# Patient Record
Sex: Female | Born: 1950
Health system: Southern US, Community
[De-identification: ages and names within clinical notes are randomized; demographics above are authoritative.]

## PROBLEM LIST (undated history)

## (undated) DIAGNOSIS — F32A Depression, unspecified: Secondary | ICD-10-CM

## (undated) DIAGNOSIS — M1990 Unspecified osteoarthritis, unspecified site: Secondary | ICD-10-CM

## (undated) DIAGNOSIS — R3129 Other microscopic hematuria: Secondary | ICD-10-CM

## (undated) DIAGNOSIS — K449 Diaphragmatic hernia without obstruction or gangrene: Secondary | ICD-10-CM

## (undated) DIAGNOSIS — K219 Gastro-esophageal reflux disease without esophagitis: Secondary | ICD-10-CM

## (undated) DIAGNOSIS — E039 Hypothyroidism, unspecified: Secondary | ICD-10-CM

## (undated) DIAGNOSIS — M545 Low back pain, unspecified: Secondary | ICD-10-CM

## (undated) DIAGNOSIS — Z9889 Other specified postprocedural states: Secondary | ICD-10-CM

## (undated) DIAGNOSIS — E785 Hyperlipidemia, unspecified: Secondary | ICD-10-CM

## (undated) DIAGNOSIS — G8929 Other chronic pain: Secondary | ICD-10-CM

## (undated) DIAGNOSIS — M5126 Other intervertebral disc displacement, lumbar region: Secondary | ICD-10-CM

## (undated) DIAGNOSIS — E669 Obesity, unspecified: Secondary | ICD-10-CM

## (undated) DIAGNOSIS — F419 Anxiety disorder, unspecified: Secondary | ICD-10-CM

## (undated) DIAGNOSIS — I471 Supraventricular tachycardia: Secondary | ICD-10-CM

## (undated) DIAGNOSIS — R12 Heartburn: Secondary | ICD-10-CM

## (undated) DIAGNOSIS — M25519 Pain in unspecified shoulder: Secondary | ICD-10-CM

## (undated) DIAGNOSIS — D509 Iron deficiency anemia, unspecified: Secondary | ICD-10-CM

## (undated) DIAGNOSIS — F329 Major depressive disorder, single episode, unspecified: Secondary | ICD-10-CM

## (undated) DIAGNOSIS — I10 Essential (primary) hypertension: Secondary | ICD-10-CM

## (undated) DIAGNOSIS — N3090 Cystitis, unspecified without hematuria: Secondary | ICD-10-CM

## (undated) DIAGNOSIS — F1911 Other psychoactive substance abuse, in remission: Secondary | ICD-10-CM

## (undated) DIAGNOSIS — R030 Elevated blood-pressure reading, without diagnosis of hypertension: Secondary | ICD-10-CM

## (undated) DIAGNOSIS — Z86718 Personal history of other venous thrombosis and embolism: Secondary | ICD-10-CM

## (undated) HISTORY — DX: Unspecified osteoarthritis, unspecified site: M19.90

## (undated) HISTORY — DX: Other chronic pain: G89.29

## (undated) HISTORY — DX: Major depressive disorder, single episode, unspecified: F32.9

## (undated) HISTORY — DX: Anxiety disorder, unspecified: F41.9

## (undated) HISTORY — DX: Depression, unspecified: F32.A

## (undated) HISTORY — DX: Obesity, unspecified: E66.9

## (undated) HISTORY — PX: BLADDER REPAIR: SHX76

## (undated) HISTORY — DX: Iron deficiency anemia, unspecified: D50.9

## (undated) HISTORY — DX: Hyperlipidemia, unspecified: E78.5

## (undated) HISTORY — PX: SHOULDER SURGERY: SHX246

## (undated) HISTORY — DX: Personal history of other venous thrombosis and embolism: Z86.718

## (undated) HISTORY — DX: Other specified postprocedural states: Z98.890

## (undated) HISTORY — DX: Cystitis, unspecified without hematuria: N30.90

## (undated) HISTORY — DX: Other microscopic hematuria: R31.29

## (undated) HISTORY — DX: Low back pain, unspecified: M54.50

## (undated) HISTORY — DX: Hypothyroidism, unspecified: E03.9

## (undated) HISTORY — DX: Diaphragmatic hernia without obstruction or gangrene: K44.9

## (undated) HISTORY — PX: APPENDECTOMY: SHX54

## (undated) HISTORY — DX: Low back pain: M54.5

## (undated) HISTORY — PX: CARPAL TUNNEL RELEASE: SHX101

## (undated) HISTORY — DX: Heartburn: R12

## (undated) HISTORY — DX: Supraventricular tachycardia: I47.1

## (undated) HISTORY — DX: Other psychoactive substance abuse, in remission: F19.11

## (undated) HISTORY — PX: RECTAL PROLAPSE REPAIR: SHX759

## (undated) HISTORY — PX: BACK SURGERY: SHX140

## (undated) HISTORY — DX: Other intervertebral disc displacement, lumbar region: M51.26

## (undated) HISTORY — DX: Elevated blood-pressure reading, without diagnosis of hypertension: R03.0

---

## 1998-06-23 ENCOUNTER — Other Ambulatory Visit: Admission: RE | Admit: 1998-06-23 | Discharge: 1998-06-23 | Payer: Self-pay | Admitting: Obstetrics and Gynecology

## 1999-03-25 ENCOUNTER — Ambulatory Visit (HOSPITAL_COMMUNITY): Admission: RE | Admit: 1999-03-25 | Discharge: 1999-03-25 | Payer: Self-pay

## 1999-04-12 ENCOUNTER — Encounter: Payer: Self-pay | Admitting: Obstetrics and Gynecology

## 1999-04-12 ENCOUNTER — Encounter: Admission: RE | Admit: 1999-04-12 | Discharge: 1999-04-12 | Payer: Self-pay | Admitting: Obstetrics and Gynecology

## 1999-04-14 ENCOUNTER — Encounter: Payer: Self-pay | Admitting: Neurosurgery

## 1999-04-14 ENCOUNTER — Ambulatory Visit (HOSPITAL_COMMUNITY): Admission: RE | Admit: 1999-04-14 | Discharge: 1999-04-14 | Payer: Self-pay | Admitting: Neurosurgery

## 1999-04-29 ENCOUNTER — Encounter: Payer: Self-pay | Admitting: Neurosurgery

## 1999-04-29 ENCOUNTER — Ambulatory Visit (HOSPITAL_COMMUNITY): Admission: RE | Admit: 1999-04-29 | Discharge: 1999-04-29 | Payer: Self-pay | Admitting: Neurosurgery

## 1999-06-08 ENCOUNTER — Encounter: Payer: Self-pay | Admitting: Neurosurgery

## 1999-06-08 ENCOUNTER — Ambulatory Visit (HOSPITAL_COMMUNITY): Admission: RE | Admit: 1999-06-08 | Discharge: 1999-06-08 | Payer: Self-pay | Admitting: Neurosurgery

## 1999-06-10 ENCOUNTER — Encounter: Payer: Self-pay | Admitting: Neurosurgery

## 1999-06-14 ENCOUNTER — Encounter: Payer: Self-pay | Admitting: Neurosurgery

## 1999-06-14 ENCOUNTER — Inpatient Hospital Stay (HOSPITAL_COMMUNITY): Admission: RE | Admit: 1999-06-14 | Discharge: 1999-06-18 | Payer: Self-pay | Admitting: Neurosurgery

## 1999-07-01 ENCOUNTER — Other Ambulatory Visit: Admission: RE | Admit: 1999-07-01 | Discharge: 1999-07-01 | Payer: Self-pay | Admitting: Family Medicine

## 1999-07-04 ENCOUNTER — Encounter: Admission: RE | Admit: 1999-07-04 | Discharge: 1999-07-04 | Payer: Self-pay | Admitting: Neurosurgery

## 1999-07-04 ENCOUNTER — Encounter: Payer: Self-pay | Admitting: Neurosurgery

## 1999-07-21 ENCOUNTER — Encounter: Payer: Self-pay | Admitting: Emergency Medicine

## 1999-07-21 ENCOUNTER — Emergency Department (HOSPITAL_COMMUNITY): Admission: EM | Admit: 1999-07-21 | Discharge: 1999-07-21 | Payer: Self-pay | Admitting: Emergency Medicine

## 1999-09-12 ENCOUNTER — Encounter: Admission: RE | Admit: 1999-09-12 | Discharge: 1999-09-12 | Payer: Self-pay | Admitting: Neurosurgery

## 1999-09-12 ENCOUNTER — Encounter: Payer: Self-pay | Admitting: Neurosurgery

## 1999-10-18 ENCOUNTER — Encounter: Payer: Self-pay | Admitting: Neurosurgery

## 1999-10-18 ENCOUNTER — Emergency Department (HOSPITAL_COMMUNITY): Admission: EM | Admit: 1999-10-18 | Discharge: 1999-10-18 | Payer: Self-pay | Admitting: Emergency Medicine

## 1999-10-18 ENCOUNTER — Encounter: Admission: RE | Admit: 1999-10-18 | Discharge: 1999-10-18 | Payer: Self-pay | Admitting: Neurosurgery

## 1999-11-01 ENCOUNTER — Encounter: Payer: Self-pay | Admitting: Neurosurgery

## 1999-11-01 ENCOUNTER — Ambulatory Visit (HOSPITAL_COMMUNITY): Admission: RE | Admit: 1999-11-01 | Discharge: 1999-11-01 | Payer: Self-pay | Admitting: Neurosurgery

## 1999-12-08 ENCOUNTER — Encounter: Payer: Self-pay | Admitting: Neurosurgery

## 1999-12-08 ENCOUNTER — Ambulatory Visit (HOSPITAL_COMMUNITY): Admission: RE | Admit: 1999-12-08 | Discharge: 1999-12-08 | Payer: Self-pay | Admitting: Neurosurgery

## 2000-03-13 ENCOUNTER — Encounter: Admission: RE | Admit: 2000-03-13 | Discharge: 2000-03-20 | Payer: Self-pay | Admitting: Family Medicine

## 2000-04-16 ENCOUNTER — Encounter: Payer: Self-pay | Admitting: Obstetrics and Gynecology

## 2000-04-16 ENCOUNTER — Encounter: Admission: RE | Admit: 2000-04-16 | Discharge: 2000-04-16 | Payer: Self-pay | Admitting: Obstetrics and Gynecology

## 2000-06-12 HISTORY — PX: LIVER BIOPSY: SHX301

## 2000-09-05 ENCOUNTER — Other Ambulatory Visit: Admission: RE | Admit: 2000-09-05 | Discharge: 2000-09-05 | Payer: Self-pay | Admitting: Family Medicine

## 2000-12-20 ENCOUNTER — Other Ambulatory Visit: Admission: RE | Admit: 2000-12-20 | Discharge: 2000-12-20 | Payer: Self-pay | Admitting: Obstetrics and Gynecology

## 2000-12-25 ENCOUNTER — Encounter: Payer: Self-pay | Admitting: Obstetrics and Gynecology

## 2000-12-25 ENCOUNTER — Encounter: Admission: RE | Admit: 2000-12-25 | Discharge: 2000-12-25 | Payer: Self-pay | Admitting: Obstetrics and Gynecology

## 2001-04-17 ENCOUNTER — Encounter: Admission: RE | Admit: 2001-04-17 | Discharge: 2001-04-17 | Payer: Self-pay | Admitting: Obstetrics and Gynecology

## 2001-04-17 ENCOUNTER — Encounter: Payer: Self-pay | Admitting: Obstetrics and Gynecology

## 2001-05-12 HISTORY — PX: RECTOCELE REPAIR: SHX761

## 2001-05-22 ENCOUNTER — Inpatient Hospital Stay (HOSPITAL_COMMUNITY): Admission: RE | Admit: 2001-05-22 | Discharge: 2001-05-23 | Payer: Self-pay | Admitting: Gynecology

## 2001-12-16 ENCOUNTER — Ambulatory Visit (HOSPITAL_BASED_OUTPATIENT_CLINIC_OR_DEPARTMENT_OTHER): Admission: RE | Admit: 2001-12-16 | Discharge: 2001-12-16 | Payer: Self-pay | Admitting: Gynecology

## 2002-02-24 ENCOUNTER — Ambulatory Visit (HOSPITAL_COMMUNITY): Admission: RE | Admit: 2002-02-24 | Discharge: 2002-02-24 | Payer: Self-pay | Admitting: Family Medicine

## 2002-03-18 ENCOUNTER — Other Ambulatory Visit: Admission: RE | Admit: 2002-03-18 | Discharge: 2002-03-18 | Payer: Self-pay | Admitting: Gynecology

## 2002-04-05 ENCOUNTER — Emergency Department (HOSPITAL_COMMUNITY): Admission: EM | Admit: 2002-04-05 | Discharge: 2002-04-05 | Payer: Self-pay | Admitting: Emergency Medicine

## 2002-04-21 ENCOUNTER — Encounter: Admission: RE | Admit: 2002-04-21 | Discharge: 2002-04-21 | Payer: Self-pay | Admitting: Gynecology

## 2002-04-21 ENCOUNTER — Encounter: Payer: Self-pay | Admitting: Gynecology

## 2002-04-23 ENCOUNTER — Encounter: Payer: Self-pay | Admitting: Emergency Medicine

## 2002-04-23 ENCOUNTER — Observation Stay (HOSPITAL_COMMUNITY): Admission: EM | Admit: 2002-04-23 | Discharge: 2002-04-24 | Payer: Self-pay | Admitting: Emergency Medicine

## 2002-06-12 HISTORY — PX: PARTIAL HYSTERECTOMY: SHX80

## 2002-06-30 ENCOUNTER — Encounter: Payer: Self-pay | Admitting: Neurosurgery

## 2002-06-30 ENCOUNTER — Ambulatory Visit (HOSPITAL_COMMUNITY): Admission: RE | Admit: 2002-06-30 | Discharge: 2002-06-30 | Payer: Self-pay | Admitting: Neurosurgery

## 2002-07-03 ENCOUNTER — Ambulatory Visit (HOSPITAL_COMMUNITY): Admission: RE | Admit: 2002-07-03 | Discharge: 2002-07-03 | Payer: Self-pay | Admitting: Internal Medicine

## 2003-01-20 ENCOUNTER — Ambulatory Visit (HOSPITAL_COMMUNITY): Admission: RE | Admit: 2003-01-20 | Discharge: 2003-01-20 | Payer: Self-pay | Admitting: Cardiology

## 2003-04-08 ENCOUNTER — Ambulatory Visit (HOSPITAL_COMMUNITY): Admission: RE | Admit: 2003-04-08 | Discharge: 2003-04-08 | Payer: Self-pay | Admitting: Family Medicine

## 2003-04-16 ENCOUNTER — Other Ambulatory Visit: Admission: RE | Admit: 2003-04-16 | Discharge: 2003-04-16 | Payer: Self-pay | Admitting: Gynecology

## 2003-04-24 ENCOUNTER — Encounter: Admission: RE | Admit: 2003-04-24 | Discharge: 2003-04-24 | Payer: Self-pay | Admitting: Gynecology

## 2003-09-29 ENCOUNTER — Encounter: Admission: RE | Admit: 2003-09-29 | Discharge: 2003-09-29 | Payer: Self-pay | Admitting: Gynecology

## 2004-01-11 DIAGNOSIS — I471 Supraventricular tachycardia, unspecified: Secondary | ICD-10-CM

## 2004-01-11 HISTORY — PX: OTHER SURGICAL HISTORY: SHX169

## 2004-01-11 HISTORY — DX: Supraventricular tachycardia: I47.1

## 2004-01-11 HISTORY — DX: Supraventricular tachycardia, unspecified: I47.10

## 2004-02-09 ENCOUNTER — Ambulatory Visit (HOSPITAL_BASED_OUTPATIENT_CLINIC_OR_DEPARTMENT_OTHER): Admission: RE | Admit: 2004-02-09 | Discharge: 2004-02-09 | Payer: Self-pay | Admitting: Orthopaedic Surgery

## 2004-02-09 ENCOUNTER — Ambulatory Visit (HOSPITAL_COMMUNITY): Admission: RE | Admit: 2004-02-09 | Discharge: 2004-02-09 | Payer: Self-pay | Admitting: Orthopaedic Surgery

## 2004-02-18 ENCOUNTER — Ambulatory Visit (HOSPITAL_COMMUNITY): Admission: RE | Admit: 2004-02-18 | Discharge: 2004-02-18 | Payer: Self-pay | Admitting: Orthopedic Surgery

## 2004-02-20 ENCOUNTER — Inpatient Hospital Stay (HOSPITAL_COMMUNITY): Admission: EM | Admit: 2004-02-20 | Discharge: 2004-02-26 | Payer: Self-pay | Admitting: Orthopedic Surgery

## 2004-04-20 ENCOUNTER — Encounter: Admission: RE | Admit: 2004-04-20 | Discharge: 2004-07-05 | Payer: Self-pay | Admitting: Orthopaedic Surgery

## 2004-04-26 ENCOUNTER — Other Ambulatory Visit: Admission: RE | Admit: 2004-04-26 | Discharge: 2004-04-26 | Payer: Self-pay | Admitting: Gynecology

## 2004-05-09 ENCOUNTER — Ambulatory Visit (HOSPITAL_COMMUNITY): Admission: RE | Admit: 2004-05-09 | Discharge: 2004-05-09 | Payer: Self-pay | Admitting: Orthopaedic Surgery

## 2004-05-25 ENCOUNTER — Encounter: Admission: RE | Admit: 2004-05-25 | Discharge: 2004-05-25 | Payer: Self-pay | Admitting: Gynecology

## 2004-08-10 ENCOUNTER — Ambulatory Visit (HOSPITAL_COMMUNITY): Admission: RE | Admit: 2004-08-10 | Discharge: 2004-08-10 | Payer: Self-pay | Admitting: Orthopaedic Surgery

## 2004-08-17 ENCOUNTER — Emergency Department (HOSPITAL_COMMUNITY): Admission: EM | Admit: 2004-08-17 | Discharge: 2004-08-17 | Payer: Self-pay | Admitting: *Deleted

## 2004-10-24 ENCOUNTER — Ambulatory Visit: Payer: Self-pay | Admitting: Orthopedic Surgery

## 2004-11-24 ENCOUNTER — Ambulatory Visit: Payer: Self-pay | Admitting: Physical Medicine & Rehabilitation

## 2004-11-24 ENCOUNTER — Inpatient Hospital Stay (HOSPITAL_COMMUNITY): Admission: RE | Admit: 2004-11-24 | Discharge: 2004-11-29 | Payer: Self-pay | Admitting: Orthopaedic Surgery

## 2004-11-29 ENCOUNTER — Ambulatory Visit: Payer: Self-pay | Admitting: Physical Medicine & Rehabilitation

## 2004-11-29 ENCOUNTER — Inpatient Hospital Stay (HOSPITAL_COMMUNITY)
Admission: RE | Admit: 2004-11-29 | Discharge: 2004-12-05 | Payer: Self-pay | Admitting: Physical Medicine & Rehabilitation

## 2005-01-02 ENCOUNTER — Encounter: Admission: RE | Admit: 2005-01-02 | Discharge: 2005-04-02 | Payer: Self-pay | Admitting: Orthopaedic Surgery

## 2005-05-02 ENCOUNTER — Other Ambulatory Visit: Admission: RE | Admit: 2005-05-02 | Discharge: 2005-05-02 | Payer: Self-pay | Admitting: Gynecology

## 2005-06-08 ENCOUNTER — Encounter: Admission: RE | Admit: 2005-06-08 | Discharge: 2005-06-08 | Payer: Self-pay | Admitting: Gynecology

## 2006-03-21 ENCOUNTER — Encounter: Payer: Self-pay | Admitting: Cardiology

## 2006-04-25 ENCOUNTER — Ambulatory Visit: Payer: Self-pay | Admitting: Cardiology

## 2006-04-30 ENCOUNTER — Ambulatory Visit: Payer: Self-pay | Admitting: Cardiology

## 2006-05-08 ENCOUNTER — Other Ambulatory Visit: Admission: RE | Admit: 2006-05-08 | Discharge: 2006-05-08 | Payer: Self-pay | Admitting: Gynecology

## 2006-06-11 ENCOUNTER — Encounter: Admission: RE | Admit: 2006-06-11 | Discharge: 2006-06-11 | Payer: Self-pay | Admitting: Family Medicine

## 2006-09-25 ENCOUNTER — Ambulatory Visit: Payer: Self-pay | Admitting: Cardiology

## 2007-05-22 ENCOUNTER — Other Ambulatory Visit: Admission: RE | Admit: 2007-05-22 | Discharge: 2007-05-22 | Payer: Self-pay | Admitting: Gynecology

## 2007-06-14 ENCOUNTER — Encounter: Admission: RE | Admit: 2007-06-14 | Discharge: 2007-06-14 | Payer: Self-pay | Admitting: Gynecology

## 2007-08-10 ENCOUNTER — Encounter: Admission: RE | Admit: 2007-08-10 | Discharge: 2007-08-10 | Payer: Self-pay | Admitting: Orthopaedic Surgery

## 2007-12-04 ENCOUNTER — Ambulatory Visit: Payer: Self-pay | Admitting: Cardiology

## 2007-12-11 ENCOUNTER — Ambulatory Visit: Payer: Self-pay | Admitting: Cardiology

## 2008-06-15 ENCOUNTER — Encounter: Admission: RE | Admit: 2008-06-15 | Discharge: 2008-06-15 | Payer: Self-pay

## 2008-07-01 ENCOUNTER — Encounter: Payer: Self-pay | Admitting: Physician Assistant

## 2008-07-01 ENCOUNTER — Ambulatory Visit: Payer: Self-pay | Admitting: Cardiology

## 2008-07-09 ENCOUNTER — Ambulatory Visit: Payer: Self-pay | Admitting: Cardiology

## 2008-12-06 ENCOUNTER — Encounter: Payer: Self-pay | Admitting: Cardiology

## 2009-01-12 ENCOUNTER — Ambulatory Visit: Payer: Self-pay | Admitting: Cardiology

## 2009-03-02 DIAGNOSIS — R0602 Shortness of breath: Secondary | ICD-10-CM

## 2009-03-02 DIAGNOSIS — R079 Chest pain, unspecified: Secondary | ICD-10-CM

## 2009-03-02 DIAGNOSIS — I10 Essential (primary) hypertension: Secondary | ICD-10-CM

## 2009-03-02 DIAGNOSIS — E785 Hyperlipidemia, unspecified: Secondary | ICD-10-CM | POA: Insufficient documentation

## 2009-03-23 ENCOUNTER — Encounter (INDEPENDENT_AMBULATORY_CARE_PROVIDER_SITE_OTHER): Payer: Self-pay | Admitting: Urology

## 2009-03-23 ENCOUNTER — Ambulatory Visit (HOSPITAL_COMMUNITY): Admission: RE | Admit: 2009-03-23 | Discharge: 2009-03-23 | Payer: Self-pay | Admitting: Urology

## 2009-06-17 ENCOUNTER — Encounter: Admission: RE | Admit: 2009-06-17 | Discharge: 2009-06-17 | Payer: Self-pay | Admitting: Family Medicine

## 2009-09-16 ENCOUNTER — Encounter: Payer: Self-pay | Admitting: Cardiology

## 2010-01-25 ENCOUNTER — Ambulatory Visit (HOSPITAL_COMMUNITY): Admission: RE | Admit: 2010-01-25 | Discharge: 2010-01-25 | Payer: Self-pay | Admitting: Family Medicine

## 2010-02-02 ENCOUNTER — Ambulatory Visit: Payer: Self-pay | Admitting: Cardiology

## 2010-02-02 DIAGNOSIS — R609 Edema, unspecified: Secondary | ICD-10-CM

## 2010-02-16 ENCOUNTER — Ambulatory Visit: Payer: Self-pay | Admitting: Cardiology

## 2010-02-16 ENCOUNTER — Encounter: Payer: Self-pay | Admitting: Cardiology

## 2010-06-08 LAB — FECAL OCCULT BLOOD, GUAIAC: Fecal Occult Blood: NEGATIVE

## 2010-06-22 ENCOUNTER — Encounter
Admission: RE | Admit: 2010-06-22 | Discharge: 2010-06-22 | Payer: Self-pay | Source: Home / Self Care | Attending: Family Medicine | Admitting: Family Medicine

## 2010-07-03 ENCOUNTER — Encounter: Payer: Self-pay | Admitting: Family Medicine

## 2010-07-14 NOTE — Assessment & Plan Note (Signed)
Summary: 1 YR FU PER AUG REMINDER-SRS   Visit Type:  Follow-up Primary Provider:  Weeks   History of Present Illness: The patient is a 60 year old female with a history of atypical chest pain and normal coronary angiogram in 2004.the patient reports no chest pain or shortness of breath. She also had prior stress echo done which was within normal limits. The patient is complaining of knee pain and bladder pain in the seeing various physicians for this. The patient's EKG today is within normal limits. She does complain of some lower extremity swelling which is new. Otherwise her cardiovascular review of systems is within normal limits  Preventive Screening-Counseling & Management  Alcohol-Tobacco     Smoking Status: never  Current Medications (verified): 1)  Toprol Xl 50 Mg Xr24h-Tab (Metoprolol Succinate) .... Take 1 Tablet By Mouth Once A Day 2)  Estrace 1 Mg Tabs (Estradiol) .... Take 1/2 By Mouth Twice Weekly 3)  Fish Oil 1000 Mg Caps (Omega-3 Fatty Acids) .... Take 1 Tablet By Mouth Four Times A Day 4)  Vitamin D 1000 Unit Tabs (Cholecalciferol) .... Take 1 Tablet By Mouth Once A Day 5)  Caltrate 600+d 600-400 Mg-Unit Tabs (Calcium Carbonate-Vitamin D) .... Take 1 Tablet By Mouth Once A Day 6)  Aspir-Low 81 Mg Tbec (Aspirin) .... Take 1 Tablet By Mouth Once A Day 7)  Chlorthalidone 50 Mg Tabs (Chlorthalidone) .... Take 1 Tablet By Mouth Once A Day 8)  Omeprazole 20 Mg Cpdr (Omeprazole) .... Take 1 Tablet By Mouth Once A Day 9)  Cyclobenzaprine Hcl 10 Mg Tabs (Cyclobenzaprine Hcl) .... Take 1 Tablet By Mouth Three Times A Day 10)  Effexor Xr 150 Mg Xr24h-Cap (Venlafaxine Hcl) .... Take 1 Tablet By Mouth Once A Day 11)  Nitrofurantoin Macrocrystal 50 Mg Caps (Nitrofurantoin Macrocrystal) .... Take 1 Tablet By Mouth Once A Day  Allergies (verified): 1)  ! * Clindamycin  Comments:  Nurse/Medical Assistant: The patient is currently on medications but does not know the name or  dosage at this time. Instructed to contact our office with details. Will update medication list at that time.  Past History:  Past Medical History: Last updated: 03/02/2009 HYPERLIPIDEMIA-MIXED (ICD-272.4) HYPERTENSION, UNSPECIFIED (ICD-401.9) DYSPNEA (ICD-786.05) CHEST PAIN-UNSPECIFIED (ICD-786.50) GERD  Past Surgical History: Last updated: 03/02/2009 Abdominal Hysterectomy-Total Back Surgery Cataract Extraction Rotator Cuff Repair  Family History: Last updated: 03/02/2009 Family History of Cancer:   Social History: Last updated: 03/02/2009 Disabled  Tobacco Use - No.  Alcohol Use - no  Risk Factors: Smoking Status: never (02/02/2010)  Review of Systems       The patient complains of joint pain.  The patient denies fatigue, malaise, fever, weight gain/loss, vision loss, decreased hearing, hoarseness, chest pain, palpitations, shortness of breath, prolonged cough, wheezing, sleep apnea, coughing up blood, abdominal pain, blood in stool, nausea, vomiting, diarrhea, heartburn, incontinence, blood in urine, muscle weakness, leg swelling, rash, skin lesions, headache, fainting, dizziness, depression, anxiety, enlarged lymph nodes, easy bruising or bleeding, and environmental allergies.    Vital Signs:  Patient profile:   60 year old female Height:      66 inches Weight:      233 pounds BMI:     37.74 Pulse rate:   66 / minute BP sitting:   118 / 71  (left arm) Cuff size:   large  Vitals Entered By: Carlye Grippe (February 02, 2010 1:22 PM)  Nutrition Counseling: Patient's BMI is greater than 25 and therefore counseled on weight management options.  Physical Exam  Additional Exam:  General: Well-developed, well-nourished in no distress head: Normocephalic and atraumatic eyes PERRLA/EOMI intact, conjunctiva and lids normal nose: No deformity or lesions mouth normal dentition, normal posterior pharynx neck: Supple, no JVD.  No masses, thyromegaly or abnormal cervical  nodes lungs: Normal breath sounds bilaterally without wheezing.  Normal percussion heart: regular rate and rhythm with normal S1 and S2, no S3 or S4.  PMI is normal.  No pathological murmurs abdomen: Normal bowel sounds, abdomen is soft and nontender without masses, organomegaly or hernias noted.  No hepatosplenomegaly musculoskeletal: Back normal, normal gait muscle strength and tone normal pulsus: Pulse is normal in all 4 extremities Extremities:2+ peripheral pitting edema neurologic: Alert and oriented x 3 skin: Intact without lesions or rashes cervical nodes: No significant adenopathy psychologic: Normal affect    EKG  Procedure date:  02/02/2010  Findings:      normal sinus rhythm incomplete right bundle branch block. Heart rate 68 beats per minute  Impression & Recommendations:  Problem # 1:  EDEMA (ICD-782.3) we'll order an echocardiogram to assess LV function.we will also change her hydrochlorothiazide to chlorthalidone 50 mg p.o. q. daily.  Problem # 2:  HYPERTENSION, UNSPECIFIED (ICD-401.9) blood pressures controlled continue current medical management. Her updated medication list for this problem includes:    Toprol Xl 50 Mg Xr24h-tab (Metoprolol succinate) .[name]... Take 1 tablet by mouth once a day    Aspir-low 81 Mg Tbec (Aspirin) .[name]... Take 1 tablet by mouth once a day    Chlorthalidone 50 Mg Tabs (Chlorthalidone) .[name]... Take 1 tablet by mouth once a day  Problem # 3:  CHEST PAIN-UNSPECIFIED (ICD-786.50) resolved. Her updated medication list for this problem includes:    Toprol Xl 50 Mg Xr24h-tab (Metoprolol succinate) .[name]... Take 1 tablet by mouth once a day    Aspir-low 81 Mg Tbec (Aspirin) .[name]... Take 1 tablet by mouth once a day  Orders: EKG w/ Interpretation (93000)  Other Orders: 2-D Echocardiogram (2D Echo)  Patient Instructions: 1)  Stop HCTZ  2)  Change to Chlorthalidone 50mg  daily  3)  2D Echo 4)  Follow up in  1 year  Prescriptions: CHLORTHALIDONE  50 MG TABS (CHLORTHALIDONE) Take 1 tablet by mouth once a day  #30 x 6   Entered by:   Hoover Brunette, LPN   Authorized by:   Lewayne Bunting, MD, [facility]   Signed by:   Hoover Brunette, LPN on 16/03/9603   Method used:   Electronically to        [facility] (retail)       [address]       [place], [place]  [address]       Ph: [phone]       Fax: [phone]   RxID:   [id number]

## 2010-08-05 ENCOUNTER — Ambulatory Visit (INDEPENDENT_AMBULATORY_CARE_PROVIDER_SITE_OTHER): Payer: Medicaid Other | Admitting: Urology

## 2010-08-05 DIAGNOSIS — N3020 Other chronic cystitis without hematuria: Secondary | ICD-10-CM

## 2010-08-05 DIAGNOSIS — K5900 Constipation, unspecified: Secondary | ICD-10-CM

## 2010-09-08 ENCOUNTER — Ambulatory Visit (INDEPENDENT_AMBULATORY_CARE_PROVIDER_SITE_OTHER): Payer: Medicaid Other | Admitting: Internal Medicine

## 2010-09-08 DIAGNOSIS — K5900 Constipation, unspecified: Secondary | ICD-10-CM

## 2010-09-15 LAB — BASIC METABOLIC PANEL
BUN: 14 mg/dL (ref 6–23)
CO2: 29 mEq/L (ref 19–32)
Calcium: 10.3 mg/dL (ref 8.4–10.5)
Chloride: 102 mEq/L (ref 96–112)
Creatinine, Ser: 0.78 mg/dL (ref 0.4–1.2)
Glucose, Bld: 99 mg/dL (ref 70–99)

## 2010-09-15 LAB — CBC
MCHC: 34.4 g/dL (ref 30.0–36.0)
MCV: 88.2 fL (ref 78.0–100.0)
Platelets: 190 10*3/uL (ref 150–400)
RDW: 11.6 % (ref 11.5–15.5)

## 2010-10-25 NOTE — Assessment & Plan Note (Signed)
[name]                          [name]   NAME:[name], [name]                       MRN:          [id number]  DATE:12/04/2007                            DOB:          [birth year]    REFERRING PHYSICIAN:  Dr. Belva [name] at [name].   [name] patient is a 60 year old female with history of multiple cardiac  risk factors.  [name] patient has undergone extensive screening in [name] past  including a CT angiogram and a coronary angiogram respectively in 2007  and 2004.  [name] patient's studies essentially were all normal.  She had a  very low calcium score on [name] CT angiogram.  [name] patient's sister died  recently and this was presumably secondary to heart disease.  [name]  patient is rather concerned about this.  She is questioning again if she  could have underlying coronary artery disease.  She denies any chest  pain.  She did have an episode of atypical pain several weeks ago, but  she has no exertional chest pain, no shortness of breath, orthopnea, or  PND.  EKG in [name] office is normal today.  [name] patient also supplied me  with her lipid panel through [name] [facility] lipo profile.  [name] patient appeared  to be at [name] goal on her current statin drug regimen.   MEDICATIONS:  Toprol 50 mg a day, Estrace, fish oil, vitamin D3,  lorazepam, Effexor, hydrochlorothiazide 25 mg a day, nitrofurantoin,  simvastatin 80 mg p.o. daily, Niaspan 5 mg p.o. t.i.d., omeprazole,  Zetia 10 mg p.o. daily, stool softener, aspirin, vitamin D, and  Citrucel.   PHYSICAL EXAMINATION:  VITAL SIGNS:  Blood pressure 129/77, heart rate  96, and weight is 142 pounds.  NECK:  Normal carotid stroke.  No carotid bruits.  LUNGS:  Clear breath sounds bilaterally.  HEART:  Regular rate and rhythm with normal S1 and S2.  No murmurs,  rubs, or gallops.  ABDOMEN:  Soft and nontender.  No rebound or guarding.  Good bowel  sounds.  EXTREMITIES:  No cyanosis,  clubbing, or edema.   Echocardiogram revealed normal sinus rhythm with complete right bundle-  branch heart block.   PROBLEM LIST:  1. Atypical chest pain.      a.     Negative dobutamine stress echocardiogram in 2007.      b.     Normal coronary angiogram in 2004.      c.     Abnormal CT angiogram, but with a low calcium score in 2007.  2. Multiple cardiac risk factors.      a.     Hypertension.      b.     Hyperlipidemia.      c.     Obesity.  3. Gastroesophageal reflux disease.   PLAN:  1. [name] patient from a cardiac perspective, however, is doing well.      She appears to be to have no unstable symptoms.  She also was again      encouraged to take aspirin a day.  2. I reviewed [name] patient's lipo  profile, which appears to be at goal.      I did ask her to follow up with Dr. Tania [name] at [name] and obtain regular blood work including monitoring      of her lipids and liver function tests as she is on multimodal      therapy for lipid lowering.  3. [name] patient can follow up with me in [name] 6 months.     [name] Codding, MD,FACC  Electronically Signed    GED/MedQ  DD: 12/04/2007  DT: 12/05/2007  Job #: [id number]

## 2010-10-25 NOTE — Assessment & Plan Note (Signed)
[facility]                          [facility]   NAME:[name], [name]                       MRN:          [id number]  DATE:01/12/2009                            DOB:          September 23, 1950    REFERRING PHYSICIAN:  Belva Agee, NP   The patient is a 60 year old female with a history of atypical chest  pain and normal coronary angiogram in 2004.  She also had a dobutamine  stress test done 6 months ago, which was within normal limits.  There  was no evidence of wall motion abnormalities.  The patient does have a  history of chronic exertional dyspnea, which is likely related to  deconditioning and obesity.  Her symptoms, however, have not changed.  She has rare episodes of chest pain which are nonexertional and they are  very atypical.  They are unpredictable in onset and only last brief  periods of time.   MEDICATIONS:  1. Toprol 50 mg p.o. daily.  2. Estrace 0.1 mg half a tablet two a week.  3. Fish oil 1000 mg q.i.d.  4. Vitamin D 1000 international units daily.  5. Calcium and vitamin D.  6. Aspirin 81 mg p.o. daily.  7. Hydrochlorothiazide 25 mg p.o. daily.  8. Omeprazole 20 mg p.o. daily.  9. Cyclobenzaprine 10 mg 3 tablets daily.  10.Effexor 150 mg p.o. q.a.m.  11.Nitrofurantoin 50 mg p.o. 1 cap daily.   PHYSICAL EXAMINATION:  VITAL SIGNS:  Blood pressure 115/78, heart rate  80, weight 237 pounds.  GENERAL:  A well-nourished white female in no apparent distress.  HEENT:  Pupils are isocoric.  Conjunctivae are clear.  NECK:  Supple.  Normal carotid upstroke and no carotid bruits.  LUNGS:  Clear breath sounds bilaterally.  HEART:  Regular rate and rhythm.  Normal S1 and S2.  No murmur, rubs, or  gallops.  ABDOMEN:  Soft, nontender.  No rebound or guarding.  Good bowel sounds.  EXTREMITIES:  No cyanosis, clubbing, or edema.   PROBLEM LIST:  1. Atypical chest pain.  2. Normal coronary angiogram and normal stress test, the  latter 6      months ago.  3. Chronic exertional dyspnea secondary to obesity and deconditioning.  4. Hypertension.  5. Dyslipidemia.  6. Gastroesophageal reflux disease.   PLAN:  1. From a cardiac standpoint, the patient is entirely stable.  There      is no indication for further stress testing.  2. The patient appears to have a chronic UTI and had a cystoscopy done      and is scheduled for further evaluation and therapy.     Learta Codding, MD,FACC  Electronically Signed    GED/MedQ  DD: 01/12/2009  DT: 01/13/2009  Job #: [id number]   cc:   Belva Agee, NP

## 2010-10-25 NOTE — Assessment & Plan Note (Signed)
[facility]                          [facility] NOTE   NAME:Mcvay, ALLEAH                       MRN:          [id number]  DATE:07/01/2008                            DOB:          03/09/1951    PRIMARY CARDIOLOGIST:  Learta Codding, MD, [facility]   REASON FOR VISIT:  Ms. Capley is a 60 year old female with history of  atypical chest pain and prior normal coronary angiogram in 2004.  She is  now referred for evaluation of left arm discomfort.   Since last seen here in the clinic in June 2009, Ms. Hackley denies any  development of exertional angina pectoris.  She has chronic exertional  dyspnea, but no significant exacerbation.  An echocardiogram in July of  last year indicated normal left ventricular function with normal wall  motion and no significant valvular abnormalities.   The patient complains of intermittent left arm discomfort, unpredictable  in onset, and oftentimes lasting several days in duration.  She is  currently asymptomatic.  She denies any recent trauma or exacerbation of  her arm pain with activity, walking, or movement.  There is no  associated chest discomfort, as well.   EKG in our office today indicates NSR at 82 bpm and normal axis and no  ischemic changes.   CURRENT MEDICATIONS:  1. Aspirin 81 daily.  2. Toprol-XL 50 daily.  3. Effexor.  4. Omeprazole.  5. Cyclobenzaprine.  6. Lorazepam.  7. Hydrochlorothiazide 25 daily.  8. Fish oil 1 g q.i.d.   PHYSICAL EXAMINATION:  VITAL SIGNS:  Blood pressure 123/75, pulse 82 and  regular, weight 242.  GENERAL:  A 60 year old female, obese, sitting upright, no distress.  HEENT:  Normocephalic, atraumatic.  NECK:  Palpable carotid pulses without bruits; no JVD.  LUNGS:  Clear to auscultation in all fields.  HEART:  Regular rate and rhythm.  No significant murmurs.  No rubs.  ABDOMEN:  Protuberant, intact bowel sounds.  EXTREMITIES:  Palpable pulse without significant  edema.  NEURO:  No focal deficit.   IMPRESSION:  1. Intermittent left arm discomfort.      a.     Atypical for cardiac etiology.      b.     Normal coronary angiogram in 2004.  2. Chronic exertional dyspnea.      a.     Normal LVF.  3. Obesity.  4. Hypertension.  5. Dyslipidemia.  6. GERD.   PLAN:  Schedule dobutamine stress echocardiogram for risk  stratification.  If this study is negative for any evidence of ischemia,  then no further workup is indicated.  If, however, there is suggestion  of possible ischemia, then we will have the patient return to our clinic  to discuss possible cardiac catheterization.  We will otherwise plan on  having her return in 6 months.      Gene Serpe, PA-C  Electronically Signed      Learta Codding, MD,FACC  Electronically Signed   GS/MedQ  DD: 07/01/2008  DT: 07/01/2008  Job #: [id number]   cc:   Belva Agee, NP

## 2010-10-28 NOTE — Op Note (Signed)
[facility]  Patient:    [name], [name] Visit Number: [id number] MRN: [id number]          Service Type: GYN Location: 4W 0466 01 Attending Physician:  Katrina Stack Dictated by:   Gretta Cool, [name] Proc. Date: 05/22/01 Admit Date:  05/22/2001 Discharge Date: 05/23/2001                             Operative Report  PREOPERATIVE DIAGNOSIS:  Enterocele and rectocele, grade 4.  POSTOPERATIVE DIAGNOSIS:  Enterocele and rectocele, grade 4.  PROCEDURES:  Enterocele and rectocele repair as in vaginal vault suspension.  SURGEON:  Gretta Cool, [name]  ASSISTANT:  Raynald Kemp, [name]  ANESTHESIA:  General orotracheal.  DESCRIPTION OF PROCEDURE:  Under excellent general anesthesia with the patient prepped and draped in Allen stirrups with bladder drained a Verres was introduced.  The vaginal introitus was grasped with Allis clamps and a V-shaped incision made in the perineal body.  The mucosa was then undermined from the introitus to the apex of the vaginal cuff.  The mucosa was then grasped along the cut edge with Allis clamps and the perirectal fascia dissected free.  A large transverse fascial defect was noted with the fascia separated from the apex of the vaginal cuff all the way to within 3-4 cm of the introitus.  First the enterocele sac was plicated with a pursestring suture of 0 Monocryl.  The uterosacral cardinal complex was then identified with Allis clamps placed on the vaginal angle for tension.  The sutures of 0 Ethibond were then secured to the uterosacral cardinal complex.  They were then secured to the ischiorectalis muscle and fascia and then secured to the fascia at the superior margin.  The fascia was then pulled all the way from near the introitus to the apex and the vaginal cuff.  The central portion of the fascia was then secured also to the closure of the vaginal cuff.  The upper layers of endopelvic fascia was  then secured in the midline with a running suture of 0 Vicryl from the apex of the cuff to the introitus.  The perineal body musculature was then repaired with interrupted sutures of 2-0 Vicryl.  The mucosa was then trimmed and the upper layers of endopelvic fascia and mucosa closed as a subcuticular closure from the apex to the vagina to the introitus.  At this point, the sponge and lap counts were correct. There were no complications.  The patient returned to the recovery room in excellent.  The Bonnano suprapubic Cystocath was placed for bladder drainage and secured with 0 Ethibond. Dictated by:   Gretta Cool, [name] Attending Physician:  Katrina Stack DD:  06/13/01 TD:  06/13/01 Job: [id number]

## 2010-10-28 NOTE — Cardiovascular Report (Signed)
   NAME:  [name], [name]                        ACCOUNT NO.:  [id number]   MEDICAL RECORD NO.:  [id number]                   PATIENT TYPE:  OIB   LOCATION:  2873                                 FACILITY:  [facility]   PHYSICIAN:  Salvadore Farber, [name]             DATE OF BIRTH:  1950/10/10   DATE OF PROCEDURE:  01/20/2003  DATE OF DISCHARGE:                              CARDIAC CATHETERIZATION   PROCEDURE:  Left heart catheterization, left ventriculography, coronary  angiography.   INDICATIONS:  [name] is a 60 year old lady without prior history of  cardiovascular disease.  Risk factors are hypertension and positive family  history.  She has atypical chest pain and a normal stress Cardiolite.  However, she presents with persistent extreme worry over her chest  discomfort.  She is therefore referred for diagnostic angiography to  definitively exclude coronary disease as the etiology of her chest  discomfort.   PROCEDURAL TECHNIQUE:  Informed consent was obtained.  Under 1% lidocaine  local anesthesia a 6-French sheath was placed in the right femoral artery  using the modified Seldinger technique.  Diagnostic angiography and  ventriculography were performed using JL4, JR4, and pigtail catheters.  The  patient tolerated the procedure well and was transferred to the holding room  in stable condition.  Sheaths are to be removed there.   COMPLICATIONS:  None.   FINDINGS:  1. LV 140/10/21.  EF 65% without regional wall motion abnormality.  2. No aortic stenosis.  3. Unable to interpret mitral regurgitation due to ventricular ectopy during     ventriculography.  4. Left main:  Essentially separate ostia of the circumflex and LAD.  5. LAD:  Moderate sized vessel giving rise to a single diagonal branch.  It     is angiographically normal.  6. Circumflex:  Moderate sized vessel giving rise to a single large     branching obtuse marginal.  It is angiographically normal.  7. RCA:   Large, dominant vessel which is angiographically normal.    IMPRESSION/RECOMMENDATIONS:  Angiographically normal coronary arteries with  normal left ventricular size and systolic function.  Suspect noncardiac  etiology to her chest discomfort.  She will follow up with Dr. Christell Constant for  further evaluation.                                               Salvadore Farber, [name]    WED/MEDQ  D:  01/20/2003  T:  01/21/2003  Job:  [id number]   cc:   Learta Codding, [name]  [address]  [address]  [place]  [place] [address]   Christell Constant, [name]  [employer]

## 2010-10-28 NOTE — Discharge Summary (Signed)
NAMESHYNIECE, SCRIPTER NO.:  [id number]   MEDICAL RECORD NO.:  [id number]          PATIENT TYPE:  IPS   LOCATION:  4142                         FACILITY:  [facility]   PHYSICIAN:  Ellwood Dense, [name]   DATE OF BIRTH:  Apr 17, 1951   DATE OF ADMISSION:  11/29/2004  DATE OF DISCHARGE:  12/05/2004                                 DISCHARGE SUMMARY   DISCHARGE DIAGNOSES:  1.  Right total knee arthroplasty on November 24, 2004, secondary to      degenerative joint disease, pain control, Coumadin for deep venous      thrombosis prophylaxis.  2.  Postoperative anemia.  3.  Hypertension.  4.  Depression.  5.  Hyperlipidemia.  6.  Gastroesophageal reflux disease.   HISTORY OF PRESENT ILLNESS:  This is a 54-yaer-old female who was admitted  on November 24, 2004, with end-stage changes of the right knee and no relief  with conservative care.  She underwent a right total knee arthroplasty on  November 24, 2004, per Dr. Lubertha Basque. Dalldorf.  Placed on Coumadin for deep  venous thrombosis prophylaxis, with postoperative pain control.  Keflex was  added on November 28, 2004, for wound coverage.  She was admitted to inpatient  rehab services.   PAST MEDICAL HISTORY:  See the discharge diagnoses.   SOCIAL HISTORY:  No alcohol or tobacco.  Lives alone in [place] in a  mobile home, with four steps to entry.  The patient is on SSI disability.  Local family works.   MEDICATIONS ON ADMISSION:  1.  Ativan t.i.d.  2.  Effexor 37.5 mg b.i.d.  3.  Toprol XL 50 mg daily.  4.  Lipitor 20 mg daily.  5.  Hydrochlorothiazide 25 mg daily.  6.  Zetia 10 mg daily.  7.  Prilosec 20 mg daily.   HOSPITAL COURSE:  The patient with progressive gains while on rehab  services, with therapies initiated on a b.i.d. basis.  The following issues  are followed during the patient's rehab course:  Pertaining to Ms. Amparo's  right total knee arthroplasty on November 24, 2004, the surgical site is healing  nicely.   Staples are to be removed on June 30th.  A home CPM machine was  ordered and Social Work so generously helped to arrange that for home.  She  remained on Coumadin for deep venous thrombosis prophylaxis.  The latest INR  of 1.7.  Home health nurse from [facility] to complete the Coumadin  protocol.  The postoperative anemia was stable.  The latest hemoglobin of  10.0, hematocrit 29.5.  Blood pressure was controlled with  hydrochlorothiazide and Toprol, with no orthostatic changes noted.  She  remained on her combination of Lipitor and Zetia for hyperlipidemia.  She  had some modest elevations in the liver function studies.  These would be  monitored per her primary [name]  She had a documented history of depression.  She remained on her Effexor b.i.d. without issue. She did have a mild  redness to her buttocks.  A First-Step mattress had been applied and  monitored with the  home health nurse continuing to check as needed.   Overall for her functional status, she was essentially minimal assist  supervision in all areas of activities of daily living with dressing,  grooming and homemaking.  Home health therapies have been arranged.  She was  discharged to home.   DISCHARGE MEDICATIONS:  1.  Coumadin 5 mg, two tab daily until December 25, 2004, and stop.  2.  Ativan 0.5 mg, 1-1/2 tab at bedtime, one tab b.i.d.  3.  Toprol XL 50 mg daily.  4.  Lipitor 20 mg daily.  5.  Zetia 10 mg daily.  6.  Hydrochlorothiazide 25 mg daily.  7.  Prilosec 20 mg daily.  8.  Effexor 37.5 mg b.i.d.  9.  OxyContin sustained release 10 mg q.12h. x1 week.  10. Oxycodone p.r.n.   ACTIVITY:  As tolerated.   DIET:  Regular diet.   WOUND CARE:  Home health nurse to remove staples on December 09, 2004, and apply  Steri-Strips.  Home health nurse to check INR  on December 06, 2004, per  [facility].   FOLLOW UP:  She will follow up with Dr. Jerl Santos of orthopedic services.  Call for an appointment.       Danie   DA/MEDQ  D:  12/05/2004  T:  12/05/2004  Job:  [id number]   cc:   Lubertha Basque. Jerl Santos, [name]  [address]  [place]  [place] [address]  Fax: [phone]   Dr. Leta Baptist, [place]

## 2010-10-28 NOTE — Op Note (Signed)
NAME:  [name], [name]                        ACCOUNT NO.:  [id number]   MEDICAL RECORD NO.:  [id number]                   PATIENT TYPE:  INP   LOCATION:  5033                                 FACILITY:  [facility]   PHYSICIAN:  Lubertha Basque. Jerl Santos, [name]             DATE OF BIRTH:  09-Dec-1950   DATE OF PROCEDURE:  02/22/2004  DATE OF DISCHARGE:                                 OPERATIVE REPORT   POSTOPERATIVE DIAGNOSIS:  Right knee sepsis.   POSTOPERATIVE DIAGNOSIS:  Right knee sepsis.   PROCEDURE:  Right knee arthroscopic irrigation and debridement.   ANESTHESIA:  General.   SURGEON:  Lubertha Basque. Jerl Santos, [name]   ASSESSMENT:  [name], P.A.   INDICATIONS FOR PROCEDURE:  Patient is a 60 year old woman a couple of weeks  from a knee arthroscopy.  She did very well for the first week but then  unfortunately developed some steady increase in pain.  This required  admission over the weekend.  She had some concomitant back problems which  were evaluated with an MRI scan and were found not to be contributing to her  legs.  She underwent two Doppler studies of her leg and one cause of the  pain was felt to be DVT, which showed up on the second study.  With  continued pain, which seemed out of proportion for a DVT, we aspirated her  knee this morning, and it showed things consistent with an infection.  She  is scheduled at this point for an arthroscopic irrigation and debridement in  the hopes of eradicating this infection.  Informed operative consent was  obtained after discussion of possible complications of reaction to  anesthesia and continued infection.   DESCRIPTION OF PROCEDURE:  The patient was taken to the operating suite,  where general anesthetic was applied without difficulty.  She was positioned  supine and prepped and draped in a normal sterile fashion.  After  administration of IV Kefzol, an arthroscopy was done to the right knee  through her old two inferior portals.  A  suprapatellar pouch was benign,  except for the grade 4 degenerative changes seen in her initial arthroscopy.  The medial and lateral compartments seemed to have very little in the way of  degenerative change.  She did have some fibrinous tissue throughout the knee  and some very thick synovial fluid.  We removed all of the thick fibrin  tissue and performed a thorough irrigation with 10 or 12 liters of saline.  Two large Hemovacs were placed into the inferior portals.  Adaptic was  placed in the wound, and dry gauze dressing with a loose Ace wrap was  applied.   For estimated blood loss and IV fluids, obtain from anesthesia records.   DISPOSITION:  The patient was extubated in the operating room, taken to the  recovery room stable.  Plans were for her to stay in the hospital on IV  Kefzol.  We will adjust her antibiotics, pending culture results from the  aspirate today.                                              Lubertha Basque Jerl Santos, [name]   PGD/MEDQ  D:  02/22/2004  T:  02/22/2004  Job:  [id number]

## 2010-10-28 NOTE — H&P (Signed)
[facility]  Patient:    [name], [name] Visit Number: [id number] MRN: [id number]          Service Type: Attending:  Gretta Cool, [name] Dictated by:   Gretta Cool, [name] Adm. Date:  05/22/01                           History and Physical  CHIEF COMPLAINT:  Pelvic support problems.  HISTORY OF PRESENT ILLNESS:  A 60 year old gravida 6, para 5, AB 1, with a history of abdominal hysterectomy with Burch procedure in 1997.  She had subsequent readmission in 1998 for symptomatic rectocele and enterocele with repairs elsewhere.  She has continued to have chronic constipation and straining at stool and now has recurrence of enterocele and rectocele.  She has good control of urine.  She has a history of recurring back problems with repetitive surgeries, 1986, 1991, and 2001, for disk disease.  She has a history of rapid progression of the enterocele and rectocele over the last year and now presents for consideration of repeat repair.  She understands that with her history of recurring disk disease and pelvic organ prolapse, recurrence and difficulties that she may have fascial weakness and that she needs to avoid heavy straining, lifting, straining at stool forever in the future, also needs to maintain vaginal estrogen.  She understands the risk of repeat prolapse.  She is now admitted for definitive repair.  She also is a high-risk surgical patient because of an exceedingly unusual risk of cardiovascular disease.  She has brothers 73 and 76 and sister 16 with coronary disease.  She has primary health care with [facility].  They have followed her lipid profile, unclear whether she has had lipoprotein A studies.  She has been on Premarin, was switched to transdermal prior to surgery because of the lesser effect on CRP, on clotting factors, sex hormone binding globulin, and triglycerides.  She is now admitted for definitive  therapy by enterocele and rectocele repairs.  She understands the greater risk of the procedure because of her family history of cardiovascular disease.  PAST MEDICAL HISTORY:  The usual childhood diseases without sequelae.  Medical illnesses:  The patient has arthritis, recurring problems with reflux esophagitis.  She also has a history of incontinence that is entirely resolved after Burch procedure, still has some moderate urgency component.  She also has a history of anxiety and depression, on Effexor and lorazepam.  She also is on pain medications and muscle relaxants daily because of her recurring disk disease.  Now is using multiple medications to provide easy daily bowel passages.  She is also splinting to effect evacuation of her bowels.  FAMILY HISTORY:  Father is 50, living and well.  Mother died of cancer, unknown primary, in 65.  Five sisters and five brothers, three with coronary disease and history of MI at 70 or younger.  No other known familial tendency to disease.  SOCIAL HISTORY:  The patient is disabled because of her disk disease.  She is separated from her husband.  She also is disabled because of her depression.  REVIEW OF SYSTEMS:  HEENT:  Denies symptoms.  CARDIORESPIRATORY:  Denies asthma, cough, bronchitis.  GASTROINTESTINAL:  Reflux esophagitis, no other significant complaints.  GENITOURINARY:  Incontinence much improved since Burch procedure, still has some urgency component.  Chronic constipation, on therapy.  Defecatory dysfunction secondary to childbirth injuries with difficulty evacuating because of the pelvic  organ prolapse.  PHYSICAL EXAMINATION:  GENERAL:  Well-developed but rather massively obese white female with high abdomen to hip ratio.  HEENT:  Pupils equal, react to light, and accommodate.  Fundi are not examined.  Oropharynx clear.  NECK:  Supple without mass, thyromegaly.  CHEST:  Clear P to A.  CARDIAC:  Regular rhythm without  murmur or cardiac enlargement.  BREASTS:  Without mass, nodes, nipple discharge.  LYMPHATIC:  Cervical, axillary, supraclavicular nodes are negative.  ABDOMEN:  Soft but rather massive panniculus with previous incision scars, without mass or organomegaly.  PELVIC:  External genitalia:  Widened genital hiatus with posterior rotation of her anus.  It rotates further posteriorly with straining.  She has a large bulge of enterocele and rectocele that bulges through the introitus.  Her vagina seems to be more vertical in orientation than usual at the apex of the vaginal cuff.  She has both central and transverse fascial tears with marked deficit of fascia extending at least two-thirds of the way down the vagina. The apex of the cuff seems reasonably well-supported.  It does not descend excessively with straining, but it is difficult to evaluate because of the huge enterocele.  Her anal sphincter appears to be intact.  She does have detachment of the perineal body musculature from levator fascia.  It takes a great deal of force to draw her anus and rectum up toward the normal position. I suspect significant levator plate injury.  Cervix and uterus are surgically absent.  Adnexa not palpable.  Rectovaginal exam confirms.  IMPRESSION: 1. Severe pelvic support problems with recurrent enterocele and rectocele,    with probable levator plate tears. 2. History of hysterectomy, Burch procedure 1997, history of enterocele and    rectocele repairs 1998. 3. History of early-onset severe cardiovascular disease in three siblings 40    or earlier. 4. Depression. 5. 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 6. Chronic constipation with straining at stool and exacerbation of obstetric    injury, now corrected.  RECOMMENDATIONS:  I have recommended on to definitive repair of rectocele and enterocele with attempted repair of levator plate abnormality and repair of her detachment  disorder.  The patient understands the risks and benefits, the greater risk to cardiovascular complications with her family history, the  alternative forms of therapy.  She has chosen to proceed with an attempt at definitive repair.  She understands the greater risk of recurrence because of her innate fascial weakness and her previous failures from surgery elsewhere. Dictated by:   Gretta Cool, [name] Attending:  Gretta Cool, [name] DD:  05/22/01 TD:  05/22/01 Job: [id number]

## 2010-10-28 NOTE — Op Note (Signed)
[place]  Patient:    [name]                      MRN: [id number] Proc. Date: 06/14/99 Adm. Date:  [id number] Attending:  Danella Penton                           Operative Report  PREOPERATIVE DIAGNOSIS:  Recurrent L4-5 herniated disk, degenerative disk disease. Bilateral L5 radiculopathy.  Degenerative disk disease 3-4 with stenosis.  POSTOPERATIVE DIAGNOSIS:  Recurrent L4-5 herniated disk, degenerative disk disease. Bilateral L5 radiculopathy.  Degenerative disk disease 3-4 with stenosis.  PROCEDURE:  Bilateral 3-4 and 4-5 diskectomy, bilateral facetectomy.  Lysis of adhesions.  Ray cages 14 x 21 at the level of 3-4 and 12 x 21 at the level of 4-5. Autologous bone graft.  SURGEON:  Tanya Nones. Jeral Fruit, [name]  ASSISTANT:  Danae Orleans. Venetia Maxon, [name]  INDICATIONS:  [name] is a 60 year old female, obese, who had been complaining of back pain with radiation down to both legs.  In the past she underwent L4-5 diskectomy first on the left side and later on in the right side.  She has failed conservative treatment.  X-rays showed degenerative disk disease which is worse at the level of 4-5.  3-4 is borderline.  Surgery was advised to proceed at 4-5 and decision of 3-4 was to be made during surgery.  The patient knew all of the risks such as worsening of the pain, need for further surgery, paralysis, and infection.  DESCRIPTION OF PROCEDURE:  The patient was taken to the OR and she was positioned in a prone manner.  The previous scar was removed.  The incision was carried down until we were able to find the spinous process.  Dissection laterally was done. We identified the area of 4-5 easily because this was the area where she had surgery before.  Nevertheless we did an x-ray to be 100% sure.  From then on we did a laminotomy at 4-5 bilaterally.  There was adhesions to the point that it was worse on the left side than on  the right side.  This was also calcified disk. Facetectomy bilaterally was done.  Total diskectomy at 4-5 was achieved.  At the end we had plenty of decompression of the L4 and L5 nerve root.  We took a look at the 3-4 and indeed this area was also with a lot of hypertrophied facet.  It was mobile and by x-ray we knew that she had stenosis.  Because of that we decided o include 3-4.  The same procedure was done, but this time what we did was a laminectomy bilateral of L4.  Preservation of the spinous process of the anterior interspinous ligament was done.  Having done the diskectomy at 3-4, we inserted Ray cages at this level at 3-4 14 x 21.  At the level of 4-5 was 12 x 21.  X-ray showed good position of the cages.  The cages were filled up with bone graft. Investigation showed no evidence of any abnormality such as subluxation.  There was an area at the level of 4-5 where there was a small pouch of arachnoid and a stitch with 6-0 Prolene was made.  Tissell was left in the dural space also.  The area was irrigated with bacitracin solution.  Then the wound was closed with Vicryl and nylon.  The patient did well.  DD:  06/14/99 TD:  06/14/99 Job: [id number]

## 2010-10-28 NOTE — Procedures (Signed)
   NAMEOLIVIANNA, HIGLEY [facility]                      ACCOUNT NO.:  [id number]   MEDICAL RECORD NO.:  [id number]                   PATIENT TYPE:  OUT   LOCATION:  RAD                                  FACILITY:  [facility]   PHYSICIAN:  Gerrit Friends. Dietrich Pates, [name] [facility]        DATE OF BIRTH:  [birth year]   DATE OF PROCEDURE:  02/24/2002  DATE OF DISCHARGE:                                  ECHOCARDIOGRAM   CLINICAL DATA:  A 60 year old woman with murmur, chest pain, and diabetes.   1. Technically adequate echocardiographic study.  2. Mild left atrial enlargement; right ventricular size is at the upper     limit of normal with borderline hypertrophy and normal systolic function.  3. Normal aortic, mitral, tricuspid, and pulmonic valves.  4. Essentially normal Doppler examination with trivial mitral regurgitation     and mild tricuspid regurgitation; estimated RV systolic pressure at the     upper limit of normal.  5. Normal internal dimension of the left ventricle with mild hypertrophy in     a concentric pattern; normal regional and global LV systolic function.  6. Normal IVC.                                               Gerrit Friends. Dietrich Pates, [name] [facility]    RMR/MEDQ  D:  02/25/2002  T:  02/25/2002  Job:  [id number]

## 2010-10-28 NOTE — Op Note (Signed)
NAME:  [name], [name]                        ACCOUNT NO.:  [id number]   MEDICAL RECORD NO.:  [id number]                   PATIENT TYPE:  AMB   LOCATION:  DAY                                  FACILITY:  [facility]   PHYSICIAN:  R. Roetta Sessions, [name]              DATE OF BIRTH:  08-Jul-1950   DATE OF PROCEDURE:  07/03/2002  DATE OF DISCHARGE:                                 OPERATIVE REPORT   PROCEDURE:  Diagnostic esophagogastroduodenoscopy followed by colonoscopy.   ENDOSCOPIST:  Gerrit Friends. Rourk, [name]   INDICATIONS FOR PROCEDURE:  The patient is a 60 year old lady referred for  intermittent hematochezia, epigastric pain, microcytic anemia back in  August.  I saw her with these chronic symptoms, and over the night prior to  my seeing her she developed right lower quadrant abdominal pain.  Ultimately  she was sent to the emergency room.  She was determined to have __________  on CT scan.  She underwent __________ by Dr. Lovell Sheehan that evening.  She has  done well aside from her chronic above-mentioned symptoms.  EGD with colonoscopy are now being done to evaluate them.  This approach has  been discussed with the patient previously; the potential risks, benefits,  and alternatives have been reviewed. Questions answered.   DESCRIPTION OF PROCEDURE:  O2 saturation, blood pressure, pulse and  respirations were monitored throughout the entire procedure.   MONITORING:  Versed 7 mg IV, Demerol 125 mg IV in divided doses.   INSTRUMENT:  Olympus video chip adult gastroscope and colonoscope.   FINDINGS:  Examination of the tubular esophagus revealed no mucosal  abnormalities.  The EG junction was easily traversed.   STOMACH:  The gastric cavity was empty.  It insufflated well with air.  A  thorough examination of the gastric mucosa including a retroflex view of the  proximal stomach and esophagogastric junction demonstrated only a moderate  size hiatal hernia.  The pylorus was patent and  easily traversed.   DUODENUM:  The bulb and the second portion were well seen. There were  erosions of the bulb otherwise.  D1 and D2 appeared normal.   COLONOSCOPY FINDINGS:  Digital rectal examination revealed no abnormalities.   ENDOSCOPIC FINDINGS:  The prep was good.   RECTUM:  Examination of the rectal mucosa including the retroflex view of  the anal verge revealed only internal hemorrhoids; otherwise rectal mucosa  appeared normal.   COLON:  The colonic mucosa was surveyed from the rectosigmoid junction  through the left transverse and right colon to the area of the appendiceal  orifice, ileocecal valve, and cecum.  The colonic mucosa of the cecum  appeared normal.  The terminal ileum was intubated to 5 cm.  This segment of  the GI tract also appeared normal.   From this level the scope was slowly and cautiously withdrawn.  All  previously mentioned mucosal surfaces were again seen; no abnormalities were  observed.  The patient tolerated both procedures well and was reacted in  endoscopy.   EGD IMPRESSION:  1. Normal esophagus.  2. Moderate size hiatal hernia, otherwise normal stomach.  3. Duodenal bulbar erosions, otherwise D1 and D2 appeared normal.   COLONOSCOPY IMPRESSION:  1. Internal hemorrhoids, otherwise normal rectum.  2. Normal colon and normal terminal ileum.   RECOMMENDATIONS:  1. We will recheck a CBC today.  2. Hemorrhoid literature provided to the patient.  A 10-day course of Anusol     suppositories, 1 per rectum at bedtime.  3. CBC and H. pylori serologies today.  4. Further recommendations to follow.                                               [name], [name]    RMR/MEDQ  D:  07/03/2002  T:  07/03/2002  Job:  [id number]   cc:   [name], [name]  [address] [place]  [place] [address]  Fax: [phone]   [name], [name]  [address]  [place]  [place] [address]  Fax: [phone]

## 2010-10-28 NOTE — Discharge Summary (Signed)
NAMEAMARYS, [name]              ACCOUNT NO.:  [id number]   MEDICAL RECORD NO.:  [id number]          PATIENT TYPE:  INP   LOCATION:  5020                         FACILITY:  [facility]   PHYSICIAN:  [name] Basque. Dalldorf, M.D.DATE OF BIRTH:  September 29, 1950   DATE OF ADMISSION:  11/24/2004  DATE OF DISCHARGE:  11/29/2004                                 DISCHARGE SUMMARY   ADMISSION DIAGNOSES:  1.  Right knee end-stage degenerative joint disease.  2.  Hypertension.  3.  Hypercholesterolemia.  4.  Obesity.  5.  History of depression.   DISCHARGE DIAGNOSES:  1.  Right knee end-stage degenerative joint disease.  2.  Hypertension.  3.  Hypercholesterolemia.  4.  Obesity.  5.  History of depression.   OPERATIONS/PROCEDURES/TREATMENT:  Right total knee replacement.   HISTORY OF PRESENT ILLNESS:  Ms. Igo is a 60 year old white female who  on January 15, 2004 underwent a knee arthroscopy and then developed a  postoperative infection. She underwent lavage and IV antibiotic treatment  but has been left with a degenerative knee a portions of which was present  prior to her unfortunate complication.  She now has a very painful knee and  as noted at the time of index arthroscopy, has significant degenerative  changes. We has aspirated her knee recently in our office and sent it to the  lab and cultures no growth, no bacteria, and no organism. She has failed  many oral NSAIDS and injectables and we have discussed treatment option with  her because of continued discomfort and pain when she walks and sleeps, that  being a total knee replacement.   LABORATORY DATA:  EKG:  Normal sinus rhythm.   Protime, INR 2.4. White blood cells 9.5, red blood cells 3.32. Hemoglobin  9.9. Platelets at 185,000. Sodium 132, potassium 3.7, glucose 123, BUN 14,  creatinine 0.8, calcium 8.8. Cultures taken at the time of surgery from her  knee, no growth, no organism noted.   HOSPITAL COURSE:  The patient was admitted  postoperatively and kept on her  home medicines of Ativan 1 mg t.i.d., Effexor 37.5 1 b.i.d., Toprol XL 50  q.d. 1 day, Lipitor 20 1 a day, HCTZ 25 1 a day, Zetia 10 mg 1 a day, and  Vivelle-Dot 0.375 two times a week, and Omeprazole 20 mg a day. She had a  lactated ringers IV at 80 cc an hour until p.o. was okay and that was set  back to [facility]. IV Ancef 1 gram x9 doses, Colace, Trinsicon, laxative of choice  also given. Coumadin and Lovenox protocol per pharmacy for deep vein  thrombosis prophylaxis. Percocet by mouth. Phenergan for nausea. CPM machine  0 to 50 and advanced 10 degrees daily. Weight bearing as tolerated with a  knee immobilizer as needed, when up and walking.   First day postoperative her blood pressure was 100/70, temperature 98.1,  hemoglobin 10.9, INR 1.1. Her lungs were clear to auscultation and  percussion. Abdomen was soft, had positive bowel sounds. Drain had been  pulled out. Her wound on her right knee showed no sign of infection. Good  neurovascular status. Foley catheter was still in. The second day  postoperative, her vital signs were stable with a heart rate of 102.  Hemoglobin of 9.9. INR of 1.5. Dressing was changed. Incision was normal  without sign of infection. Good motor and sensory to her toes and foot. PCA  was discontinued and her IV's put to a heparin lock. She continued to  progress and physical therapy and rehabilitation unit had consulted on her  for possible admission to the rehabilitation part of the hospital. She was  sent to rehabilitation on November 29, 2004.   CONDITION ON DISCHARGE:  Improved.   DISCHARGE MEDICATIONS:  She will continue to be on the same medications:  1.  Ativan 1 mg t.i.d.  2.  Effexor XR 37.5 1 b.i.d.  3.  Toprol XL 50 1 day.  4.  Lipitor 20 1 a day.  5.  HCTZ 25 1 a day.  6.  Zetia 10 mg 1 a day.  7.  Vivelle-Dot 0.375 two times a week.  8.  Omeprazole 20 mg a day.  9.  Percocet.  10. Continue on her Trinsicon and  Colace.  11. Continue low-dose Coumadin and Lovenox protocol for deep vein thrombosis      prophylaxis.   SPECIAL INSTRUCTIONS:  Continue with physical therapy. She will be weight  bearing as tolerated.   DIET:  Regular.   FOLLOW UP:  We will take her staples out at the 2 week mark. Continue to  followup on rehabilitation.      [name], P.A.      [name], [name]  Electronically Signed    MC/MEDQ  D:  02/02/2005  T:  02/03/2005  Job:  [id number]

## 2010-10-28 NOTE — Discharge Summary (Signed)
[facility]  Patient:    [name], [name] [facility] Visit Number: [id number] MRN: [id number]          Service Type: Attending:  Gretta Cool, [name] Dictated by:   Jeani Sow, F.N.P. Adm. Date:  05/22/01 Disc. Date: 05/23/01                             Discharge Summary  HISTORY OF PRESENT ILLNESS:  Ms. Hallett is a 60 year old female, gravida 5, para 6, AB 1, with a history of abdominal hysterectomy with Charletta Cousin procedure in 1997.  She had readmission in 1998, for symptomatic rectocele and enterocele with repairs elsewhere.  She now has chronic constipation and straining with stool, and recurrence of the enterocele and rectocele.  She does, however, have good control of her urine.  She has had repetitive back surgeries in 1986, 1991, and 2001, for disk disease.  She understands that with her history of recurring pelvic organ prolapse that she may have fascial weakness and needs to avoid heavy straining, lifting, and straining with stool in the future.  She also understands the risk of repeat prolapse.  She is a high risk surgical patient because of exceedingly unusual risk of cardiovascular disease.  She is followed by a primary physician in [facility].  She has been on Coumadin, and has been switched to transdermal therapy prior to surgery.  She is now admitted for definitive therapy via enterocele and rectocele repairs.  Risks and benefits have been discussed with the patient, and she accepts these procedures.  PHYSICAL EXAMINATION:  CHEST:  Clear to auscultation and percussion.  HEART:  Regular rate and rhythm without murmur, gallop, or cardiac enlargement.  ABDOMEN:  Soft, but rather massive penicillus with previous incision scars. No masses or organomegaly palpable.  PELVIC:  External genitalia with widened genital hiatus with a posterior rotation of the anus.  It rotates further with straining.  There is a large bulge of  enterocele and rectocele that bulges through the entriotus.  The vagina seems to be more vertical in orientation than usual at the apex of the vaginal cuff.  There are both central and transverse fascial tears with marked deficit of fascia extending at least 2/3 down the vagina.  The apex of the cuff is reasonably well supported.  The anal sphincter appears to be intact. There is some detachment of the perineal body musculature from the levator fascia.  It takes a great deal of force to draw the anus and rectum towards the normal position.  There is suspicion of significant levator plate injury. Cervix and uterus are surgically absent.  Adnexa nonpalpable.  Rectovaginal examination confirms.  IMPRESSION: 1. Severe pelvic support problems with recurrent enterocele and rectocele with    probable levator plate tears. 2. History of hysterectomy, Birch procedure in 1997. 3. History of enterocele and rectocele repairs in 1998. 4. History of early onset cardiovascular disease with three siblings 40 or    earlier. 5. Depression. 6. Repeated back surgeries. 7. Chronic constipation with straining at stool, and exacerbation of obstetric    injury, now corrected.  PLAN:  On to definitive repair by rectocele and enterocele repairs with attempted repair of the levator plate abnormality, and repair of the detachment disorder.  Risks and benefits have been discussed with the patient, and she accepts these procedures.  LABORATORY DATA:  Admission hemoglobin 12.0, hematocrit 35.8.  HOSPITAL COURSE:  The patient underwent enterocele  and rectocele repair, and vaginal vault suspension under general anesthesia.  The procedures were completed without any complications, and the patient was returned to the recovery room in excellent condition.  Her postoperative course was without complications, and she was discharged on the first postoperative day in excellent condition.  She was voiding well at  discharge, and the suprapubic catheter was removed.  FINAL DISCHARGE INSTRUCTIONS: 1. No heavy lifting or straining.  No vaginal entrance.  Increase ambulation    as tolerated. 2. She is to call for any fever of 100.5, or failure of daily improvement.  DIET:  Regular.  DISCHARGE MEDICATIONS: 1. Tylox one p.o. q.4h. p.r.n. discomfort. 2. Bextra 20 mg b.i.d.  FOLLOWUP:  She is to return to the office in one week for followup.  CONDITION ON DISCHARGE:  Excellent.  FINAL DISCHARGE DIAGNOSIS:  Enterocele and rectocele grade IV.  PROCEDURES PERFORMED:  Enterocele and rectocele repairs, vaginal vault suspension under general anesthesia.Dictated by:   Jeani Sow, F.N.P.  Attending:  Gretta Cool, [name] DD:  06/17/01 TD:  06/17/01 Job: [id number]

## 2010-10-28 NOTE — Op Note (Signed)
NAMEREIDA, HEM NO.:  [id number]   MEDICAL RECORD NO.:  [id number]                    PATIENT TYPE:   LOCATION:                                       FACILITY:   PHYSICIAN:  Dalia Heading, [name]               DATE OF BIRTH:   DATE OF PROCEDURE:  04/23/2002  DATE OF DISCHARGE:                                 OPERATIVE REPORT   PREOPERATIVE DIAGNOSIS:  Acute appendicitis.   POSTOPERATIVE DIAGNOSIS:  Acute appendicitis.   PROCEDURE:  Laparoscopic appendectomy.   SURGEON:  Dalia Heading, [name]   ANESTHESIA:  General endotracheal.   INDICATIONS:  The patient is a 60 year old white female who presents with  right lower quadrant abdominal pain.  CAT scan of the abdomen reveals acute  appendicitis.  The risks and benefits of the procedure including bleeding,  infection, and the possibility of an open procedure were fully explained to  the patient, who gave informed consent.   DESCRIPTION OF PROCEDURE:  The patient was placed in the Trendelenburg  position after induction of general endotracheal anesthesia.  The abdomen  was prepped and draped using the usual sterile technique with Betadine.  Surgical site confirmation was performed.   A supraumbilical incision was made down to the fascia.  A Veress needle was  introduced into the abdominal cavity and confirmation of placement was done  using the saline drop test.  The abdomen was then insufflated to 16 mmHg  pressure.  An 11-mm trocar was introduced into the abdominal cavity under  direct visualization without difficulty.  A 12-mm trocar was then placed  under direct visualization as well as a 5-mm right flank trocar.  The  appendix was visualized and noted to be acutely inflamed.  There was no  evidence of perforation.   The mesoappendix was divided using the harmonic scalpel.  An Endo-GIA was  placed across the base of the appendix and fired.  The appendix was  delivered through the  12-mm trocar site using an EndoCatch bag.  The staple  line as well as mesentery were inspected. No abnormal bleeding was noted.  All air was evacuated from the abdominal cavity prior to removal of the  trocars.   All wounds were irrigated with normal saline.  All wounds were injected with  0.5% Sensorcaine.  The supraumbilical fascia was reapproximated using an #0  Vicryl interrupted suture.  All skin incisions were closed using staples.  Betadine ointment and dry sterile dressing were applied.   All tape and needle counts were correct at the end of the procedure.  The  patient was extubated in the operating room and went to the recovery room  awake and stable condition.   COMPLICATIONS:  None.   SPECIMENS:  Appendix.   BLOOD LOSS:  Minimal.  Dalia Heading, [name]    MAJ/MEDQ  D:  04/23/2002  T:  04/24/2002  Job:  [id number]   cc:   [facility] [employer]

## 2010-10-28 NOTE — Op Note (Signed)
[name], [name]              ACCOUNT NO.:  [id number]   MEDICAL RECORD NO.:  [id number]          PATIENT TYPE:  INP   LOCATION:  5020                         FACILITY:  [facility]   PHYSICIAN:  Lubertha Basque. Dalldorf, M.D.DATE OF BIRTH:  08/24/1950   DATE OF PROCEDURE:  11/24/2004  DATE OF DISCHARGE:                                 OPERATIVE REPORT   PREOPERATIVE DIAGNOSIS:  Right knee degenerative arthritis.   POSTOPERATIVE DIAGNOSIS:  Right knee degenerative arthritis.   PROCEDURE:  Right total knee replacement.   ANESTHESIA:  General.   ATTENDING SURGEON:  Lubertha Basque. Jerl Santos, [name]   ASSISTANT:  [name], P.A.   INDICATIONS FOR PROCEDURE:  The patient is a 60 year old woman about a year  out from a knee arthroscopy which was done for torn cartilage and  degeneration.  Unfortunately, she developed a postoperative infection.  She  required surgical intervention and IV antibiotic and has since cleared the  infection.  Unfortunately, she has been left with the same degenerative  arthritis.  She has pain which interrupts her sleep and pain which makes  activity difficult.  She has been aspirated several times since the surgery  with all Gram stains and cultures being negative for infection.  She has  failed various oral anti-inflammatories, cortisone injections, and a series  of a viscosupplementative agent.  She is now offered a knee replacement.  Informed operative consent was obtained after discussion of possible  complications of reaction to anesthesia, infection, DVT, PE, death.   DESCRIPTION OF PROCEDURE:  The patient was taken to the operating suite  where a general anesthetic was applied without difficulty.  She was  positioned supine and prepped and draped in normal sterile fashion.  After  the administration of preop IV antibiotic, the right leg was elevated,  exsanguinated, and a tourniquet inflated about her thigh.  A longitudinal  anterior incision was made with  dissection down through a great abundance of  adipose tissue to expose the extensor mechanism.  The medial parapatellar  incision was made in this structure and the kneecap was flipped and the knee  flexed, again with some difficulty.  All appropriate anti-infective measures  used including closed-hooded exhaust systems for each member of the surgical  team, the preoperative IV antibiotics, and a Betadine-impregnated drape.  The tibia was exposed after removal of some residual meniscal tissues and  ACL and PCL.  A generous cut was made with a slight posterior tilt using an  extramedullary guide.  An intramedullary guide was then placed in the femur  in order to make anterior-posterior cuts, creating an flexion gap of 10 mm.  An intramedullary guide was subsequently placed again in the femur in order  to make a distal cut, creating an equal extension gap of 10 mm.  We set her  up very loose as she did have about 10-degree flexion contracture prior to  the case.  The femur sized to a standard plus while the tibia sized to a 4  and the appropriate guides were placed and utilized.  The patella was cut  down in thickness by  about 6 or 7 mm and sized to a 35 with the appropriate  guide placed and utilized.  A trial reduction was done with these components  and the 10-mm spacer.  She came to full extension and flexed well with a  patella that tracked well.  Trial components were removed followed by  pulsatile lavage and irrigation of all 3 cut bony surfaces.  Cement was  mixed including antibiotic.  The cement and antibiotic mixture was  pressurized onto the cut bony surfaces, followed by placement of the DePuy  LCS components, which were again a standard plus femur, 4 tibia, 10-mm deep  dish spacer, and 35-mm all-polyethylene patella.  Excess cement was trimmed  and pressure was held on the components until the cement had hardened.  Tourniquet was deflated and a small amount of bleeding was easily  controlled  with Bovie cautery and pressure.  The knee ranged from full extension to 120  degrees .  A drain was placed exiting superolaterally after thorough  irrigation.  The extensor mechanism was reapproximated with #1 Vicryl in  interrupted fashion and subcutaneous tissue was reapproximated in several  layers with Vicryl due to the thickness of her adipose tissue.  The skin was  closed staples.  The knee again easily flexed to 120 degrees against gravity  at the end of the case.  Adaptic was applied to the wound followed by dry  gauze and a loose Ace wrap.  Estimated blood loss was 200 mL and  intraoperative fluids can be obtained from anesthesia records.  Tourniquet  time can also be obtained from anesthesia records. but was over 1 hour,  consistent with the difficulty of the case.   DISPOSITION:  The patient was extubated in the operating room and taken to  the recovery room in stable addition.  Plans were for her to be admitted to  the Orthopedic Surgery Service for appropriate postop care to include  perioperative antibiotics and Coumadin plus Lovenox for DVT prophylaxis.       PGD/MEDQ  D:  11/24/2004  T:  11/25/2004  Job:  [id number]

## 2010-10-28 NOTE — Op Note (Signed)
[facility]  Patient:    [name], [name] [facility] Visit Number: [id number] MRN: [id number]          Service Type: NES Location: NESC Attending Physician:  Katrina Stack Dictated by:   Gretta Cool, [name] Admit Date:  12/16/2001 Discharge Date: 12/16/2001                             Operative Report  PREOPERATIVE DIAGNOSIS:  Vaginal stenosis post posterior and enterocele repair.  POSTOPERATIVE DIAGNOSIS:  Vaginal stenosis post posterior and enterocele repair.  PROCEDURE:  Y-V plasty release of vaginal adhesive band and revision of posterior repair.  DESCRIPTION OF PROCEDURE:  Under satisfactory IV sedation with patient prepped and draped in lithotomy position in [place] stirrups, the vagina was anesthetized throughout its lower one-third.  The vaginal band causing stenosis was then incised parallel to the length of the vagina.  The adhesions were then lysed by sharp dissection and until the adhesive band was entirely lysed.  At this point sutures were placed so as to close the incision as a Y-V plasty so as to increase the circumference of the vagina at that point.  After closure of the fascia with interrupted sutures of 2-0 Vicryl and subcuticular closure of 2-0 Vicryl for the submucosa, the Y-Y plasty was completely repaired.  At this point there was no significant bleeding, and the vagina was of a very adequate caliber with complete release of the adhesive band.  The support remained quite excellent.  At this point the procedure was terminated without complication and the patient returned to the recovery room in excellent condition. Dictated by:   Gretta Cool, [name] Attending Physician:  Katrina Stack DD:  12/16/01 TD:  12/18/01 Job: [id number]

## 2010-10-28 NOTE — H&P (Signed)
NAMECICELY, ORTNER NO.:  [id number]   MEDICAL RECORD NO.:  [id number]          PATIENT TYPE:  IPS   LOCATION:  4142                         FACILITY:  [facility]   PHYSICIAN:  Erick Colace, M.D.DATE OF BIRTH:  1950-09-14   DATE OF ADMISSION:  11/29/2004  DATE OF DISCHARGE:                                HISTORY & PHYSICAL   ATTENDING PHYSICIAN:  Ellwood Dense, [name]   DICTATING PHYSICIAN:  Erick Colace, [name]   REASON FOR ADMISSION:  Decreased self care and mobility following right  total knee replacement for osteoarthritis.    A 60 year old female admitted on November 24, 2004, with end-stage changes of  DJD of right knee. She had no relief from conservative care. She underwent  right TKR November 25, 2004, with Dr. Jerl Santos.  She was placed on Coumadin for  deep DVT prophylaxis and made weightbearing as tolerated.  Postop pain  control with PCA pump. She used the CPM machine.  She was placed on Keflex  November 28, 2004, for wound coverage.  She developed right buttocks stage I  decubitus during her acute care stay and has been placed on First Step  mattress.   REVIEW OF SYSTEMS:  Negative for chest pain.  Negative for nausea or  vomiting.  Positive for reflux.  Positive for anxiety and depression.   PAST MEDICAL HISTORY:  1.  Hypertension.  2.  Non-insulin-dependent diabetes mellitus.  3.  Depression.  4.  History of DVT.  5.  Back and shoulder surgery.  6.  Bladder tack.  7.  Bilateral cataract surgery.  8.  Hyperlipidemia.   HABITS:  Negative ETOH.  Negative tobacco.   FAMILY HISTORY:  Noncontributory.   SOCIAL HISTORY:  Lives alone in [place] in mobile home with four steps to  enter. Patient on SSI disability.  Local family works.   FUNCTIONAL HISTORY:  Independent.  Functional status: Minimum assistance  with bed mobility and transfers. Supervision gait 15 feet with rolling  walker.   MEDICATIONS:  1.  Ativan 1 p.o. t.i.d.  2.   Effexor 37.5 b.i.d.  3.  Toprol XL 50 mg p.o. daily.  4.  Lipitor 20 mg p.o. daily.  5.  Hydrochlorothiazide 25 mg p.o. daily.  6.  Zetia 10 mg p.o. daily.  7.  Prilosec 20 mg p.o. daily.   ALLERGIES:  None known.   LABORATORY DATA:  Last hemoglobin 9.9.  Last sodium 132, potassium 3.7, BUN  14, and creatinine 0.8.  INR 1.9 on June 20.   PHYSICAL EXAMINATION:  GENERAL:  Obese female in no acute distress.  VITAL SIGNS:  Blood pressure 108/45, pulse 86, temperature 98.4,  respirations 20.  EYES:  Anicteric, not injected.  ENT:  Normal.  NECK:  Supple without adenopathy.  LUNGS:  Respiratory effort is good.  Lungs clear to auscultation.  HEART:  Regular rate and rhythm.  No murmurs, rubs, or extra sounds.  SKIN:  Her right buttock shows stage I decubitus with ecchymotic area mid  cheek.  No open areas.  ABDOMEN:  Positive bowel sounds.  Soft, nontender to palpation.  EXTREMITIES:  Her right knee incision is clean, dry, and intact.  Strength  and tone is reduced with 2- hip flexor, 4- quadriceps on the right with some  knee support.  Otherwise 4/5 bilateral upper and lower extremities.  NEUROLOGIC:  Mentation, mood, memory, judgment are all intact.  Her range of motion is decreased in right knee.   IMPRESSION:  1.  Right total knee replacement, postoperative day #5.  2.  Osteoarthritis, right knee, chronic pain, postoperative pain. Oxycodone      (IR) p.r.n.  3.  Deep vein thrombosis prophylaxis with Coumadin.  4.  Postoperative anemia.  Will continue following up on CBCs.  5.  Hypertension.  Continue hydrochlorothiazide and Toprol.  6.  Anxiety and depression.  Continue Effexor and Ativan.  7.  Hyperlipidemia.  Continue Zocor and Zetia.  8.  Gastroesophageal reflux disease.  Continue Protonix.   Estimated length of stay is 7 to 10 days.  Prognosis for functional  improvement is good.  Goals are for modified independent level.       AEK/MEDQ  D:  11/29/2004  T:  11/29/2004   Job:  [id number]   cc:   Ellwood Dense, [name]  [address] [place]  [place] [address]  Fax: [phone]   Lubertha Basque. Jerl Santos, [name]  [address]  [place]  [place] [address]  Fax: [phone]

## 2010-10-28 NOTE — Op Note (Signed)
NAME:  [name], [name]                        ACCOUNT NO.:  [id number]   MEDICAL RECORD NO.:  [id number]                   PATIENT TYPE:  AMB   LOCATION:  DSC                                  FACILITY:  [facility]   PHYSICIAN:  Lubertha Basque. Jerl Santos, [name]             DATE OF BIRTH:  Apr 19, 1951   DATE OF PROCEDURE:  02/09/2004  DATE OF DISCHARGE:                                 OPERATIVE REPORT   PREOPERATIVE DIAGNOSES:  1.  Right knee degenerative joint disease.  2.  Right knee torn medial meniscus.   POSTOPERATIVE DIAGNOSES:  1.  Right knee degenerative joint disease.  2.  Right knee torn medial meniscus.   OPERATION PERFORMED:  1.  Right knee abrasion arthroplasty.  2.  Right knee partial medial meniscectomy.   SURGEON:  Lubertha Basque. Jerl Santos, [name]   ASSISTANT:  Prince Rome, P.A.   ANESTHESIA:  General.   INDICATIONS FOR PROCEDURE:  The patient is a 60 year old woman with a long  history of bilateral knee pain worse on the right.  This has persisted  despite various oral anti-inflammatories and several cortisone injections  which have helped her for a short period of time.  On x-ray she has some  joint space narrowing but most of her symptoms are at the patellofemoral  joint with mechanical catching and locking.  She has pain at rest and pain  with activity and is offered an arthroscopy.  Informed operative consent was  obtained after discussion of possible complications of reaction to  anesthesia and infection.   DESCRIPTION OF PROCEDURE:  The patient was taken to the operating suite  where general anesthetic was applied without difficulty.  The patient was  positioned supine and prepped and draped in the normal sterile fashion.  After the administration of preop intravenous antibiotics, an arthroscopy of  the right knee was performed through two inferior portals.  The  suprapatellar pouch was benign while the patellofemoral joint did exhibit  grade 4 change on both sides  of this joint.  A thorough chondroplasty was  done of some loose flaps of articular cartilage and an abrasion was done  back to bleeding bone in a portion of intertrochlear groove.  No lateral  release was felt to be required as the knee cap did track well.  Medial  compartment exhibited minimal degenerative change and she did have a small  posterior horn medial meniscal tear addressed with a small partial medial  meniscectomy back to stable tissues.  The lateral compartment appeared  relatively benign.  The ACL and the PCL were intact.  The knee was  thoroughly irrigated at the end of the case followed by placement of  Marcaine with epinephrine and morphine plus some Depo-Medrol.  Adaptic was  placed over the portals followed by dry gauze and a loose Ace wrap.  Estimated blood loss and intraoperative fluids can be obtained from  anesthesia records.   DISPOSITION:  The patient was extubated in the operating room and taken to  the recovery room in stable condition.  Plans were for the patient to go  home the same day and to follow up in the office in less than a week.  I  will contact her by phone tonight.                                               Lubertha Basque Jerl Santos, [name]    PGD/MEDQ  D:  02/09/2004  T:  02/09/2004  Job:  [id number]

## 2010-10-28 NOTE — Consult Note (Signed)
NAME:  [name], SPILLMAN                        ACCOUNT NO.:  [id number]   MEDICAL RECORD NO.:  [id number]                   PATIENT TYPE:   LOCATION:                                       FACILITY:   PHYSICIAN:  R. Roetta Sessions, [name]              DATE OF BIRTH:  1950-09-14   DATE OF CONSULTATION:  04/23/2002  DATE OF DISCHARGE:                                   CONSULTATION   REFERRING PHYSICIANS:  1. [name], [name]  2. [name], [name]   REASON FOR REFERRAL:  1. Rectal bleeding.  2. Microcytic anemia.   HISTORY OF PRESENT ILLNESS:  This patient is a pleasant 60 year old lady  sent over by courtesy of [name], [name], and [name], [name],  to further evaluate intermittent rectal bleeding.  She states that she moves  her bowels daily to every other day and has been passing some gross blood  around the otherwise normal-appearing stool.  She also has frequent reflux  symptoms of epigastric pain.  She has been taking Skelaxin and Mobic.  She  has also been concomitantly taking Aciphex 12 mg daily.  No odynophagia.  No  dysphagia.  No early satiety.  No weight loss.  She has never had her upper  or lower GI tract evaluated.  There is no family history of colorectal  neoplasia.  Apparently she has been to the emergency room on at least one  occasion through [facility] back on March 18, 2002.  The hemoglobin was 11.9, hematocrit 36.8, MCV 79.7, and platelet count  232,000.  At the [facility] Emergency Department, the hemoglobin  was 12.4, hematocrit 37.1, and MCV 79.1.  She has had her uterus removed  previously.  The patient tells me that over the past 24 hours she has  developed right lower quadrant abdominal pain.  She has had some left lower  quadrant abdominal pain off and on recently, but the right lower quadrant  abdominal pain has started for the first time yesterday afternoon and has  progressively worsened over the  past 24 hours.  Again, she has not had any  nausea, vomiting, fever, or chills.   PAST MEDICAL HISTORY:  Significant for:  1. Hypertension.  2. Depression.  3. Hypercholesterolemia.  4. Anxiety.  5. Neurosis.   PAST SURGICAL HISTORY:  1. Back surgery x 3.  2. Bilateral carpal tunnel surgery.  3. Right shoulder surgery.  4. Partial hysterectomy.  5. Bladder tacking.  6. Rectocele repair x 2.    CURRENT MEDICATIONS:  1. Mylanta two gel caps daily.  2. Stool softener b.i.d.  3. Citrucel b.i.d.  4. Multivitamins daily.  5. Iron supplement b.i.d.  6. Metoprolol 50 mg daily.  7. Skelaxin 400 mg t.i.d.  8. Mobic 7.5 mg b.i.d.  9. HCTZ 25 mg daily.  10.      Lipitor 20 mg daily.  11.      Aciphex 20 mg daily.  12.      Fortabs 325/50 mg p.r.n.  13.      Lorazepam 1 mg five daily.  14.      Effexor 37.5 mg b.i.d.  15.      Vivelle patch 0.05 mg two weekly.  16.      Estratest 0.1 mg cream.  17.      Avelox 400 mg daily.   ALLERGIES:  No known drug allergies.   FAMILY HISTORY:  Negative for chronic liver or GI illness.  Her father  succumbed to COPD.  Her mother died with breast cancer.   SOCIAL HISTORY:  The patient is separated and has five children.  Disabled.  No tobacco or alcohol.   REVIEW OF SYMPTOMS:  As in the history of present illness.  No fever or  chills.   PHYSICAL EXAMINATION:  GENERAL APPEARANCE:  A pleasant 60 year old lady  resting comfortably.  WEIGHT:  239.5 pounds.  HEIGHT:  5 feet 6 inches.  VITAL SIGNS:  Temperature 97.9 degrees, BP 110/72, pulse 88.  SKIN:  Warm and dry.  There is no jaundice or stigmata of chronic liver  disease.  HEENT:  No scleral icterus.  The conjunctivae are pink.  Oral cavity with no  lesions.  NECK:  JVD is not prominent.  CHEST:  The lungs are clear to auscultation.  CARDIAC:  Regular rate and rhythm without murmur, rub, or gallop.  BREASTS:  Deferred.  ABDOMEN:  Obese.  Positive bowel sounds.  Soft.  She has  localized  tenderness over McBurney's point.  In fact, she voluntary guards and there  is equivocal rebound to deep palpation.  There is no appreciable mass or  organomegaly.  EXTREMITIES:  No edema.  RECTAL:  Good sphincter tone.  Scant brown stool in the rectal vault.  No  impaction.  The stool is Hemoccult positive.   IMPRESSION:  The patient is a pleasant 60 year old lady sent over to be  further evaluated because of her intermittent rectal bleeding.  She has  borderline microcytic anemia.  She has epigastric pain and chronic reflux  symptoms while taking multiple nonsteroidal-containing agents and  concomitant acid-suppression therapy and Aciphex 20 mg orally daily.   She has new right lower quadrant abdominal pain.  She has voluntary guarding  and equivocal rebound tenderness.  A surgical process, such as appendicitis  needs to be ruled out.  As far as her chronic symptoms go, she will need to  have both an EGD and colonoscopy in the near future, but again appendicitis  needs to be ruled out at this time.  There is no history of her having her  appendix removed previously.  I told her that she needs to go to the  emergency room and be further evaluated.   I called Nicoletta Dress. Colon Branch, [name], and apprised her of the situation.  She has  kindly agreed to see the patient and perform urgent evaluation effective on  arrival of her right lower quadrant abdominal pain.  Will make further  recommendations after her emergency department evaluation has been complete.   I would like to thank [name], [name], and [name], [name], for  their kind referral.  [name], [name]    RMR/MEDQ  D:  04/23/2002  T:  04/23/2002  Job:  [id number]   cc:   [name], [name]  [address] [place]  [place] [address]  Fax: [phone]   [name], [name]

## 2010-10-28 NOTE — Assessment & Plan Note (Signed)
[facility]                          [facility] OFFICE NOTE   NAME:[name], [name]                       MRN:          [id number]  DATE:09/25/2006                            DOB:          08/26/1950    PRIMARY CARDIOLOGIST:  Dr. Andee Lineman   REASON FOR VISIT:  Schedule 3 month followup.   Since last seen here in the clinic in November 2007 by Dr. Andee Lineman, the  patient reports 1 singular episode of chest pain approximately 1 month  ago, which occurred while at rest.  This lasted approximately 15 minutes  and she did not seek medical attention for this.   Patient has not had any recurrent episodes and, in fact, denies any  history of exertional chest discomfort.   When last seen, patient was referred for a dobutamine stress  echocardiogram for further assessment of her history of chest pain.  This was interpreted as negative for ischemia, by Dr. Andee Lineman, and  notably patient did not have any chest pain during dobutamine infusion.   Electrocardiogram today reveals an SR at 80 bpm with normal access and  no change since previous study.   CURRENT MEDICATIONS:  1. Aspirin 81 mg daily.  2. Fish oil 1000 daily.  3. Niaspan 1500 mg nightly.  4. Zetia 10 daily.  5. Omeprazole 20 b.i.d.  6. Simvastatin 80 daily.  7. Nitrofurantoin 100 daily.  8. Hydrochlorothiazide 25 daily.  9. Effexor XR 37.5 b.i.d.  10.Cyclobenzaprine 10 q.i.d.  11.Lorazepam 1 mg 5x a day.  12.Hormone replacement patch as directed.   PHYSICAL EXAMINATION:  Blood pressure 144/76, pulse 84, regular.  Weight  248.6.  GENERAL:  A 60 year old female, obese, sitting upright in no distress.  HEENT:  Normocephalic.  Atraumatic.  NECK:  Palpable bilateral carotid pulse without bruits.  LUNGS:  Clear to auscultation in all fields.  HEART:  Regular rate and rhythm (S1, S3).  No significant murmurs.  ABDOMEN:  Protuberant, nontender.  EXTREMITIES:  2+ bilateral pedal edema.  NEURO:  No  focal deficit.   IMPRESSION:  1. Atypical chest pain.      a.     Negative dobutamine stress echo, November 2007.      b.     Normal coronary angiogram 2004.      c.     Abnormal cardiac CT angiogram (calcium score 26), October       2007.  2. Multiple cardiac risk factors.      a.     Hypertension.      b.     Hyperlipidemia.      c.     Obesity.      d.     Age.      e.     Family history.  3. Gastroesophageal reflux disease.   PLAN:  Following review with Dr. Andee Lineman, recommendation is to continue  annual followup and aggressive risk factor modification.  No further  cardiac testing indicated at this time.  The patient is to maintain  regular followup with her primary care physician for aggressive  monitoring of hypertension and hyperlipidemia.  Gene Serpe, PA-C  Electronically Signed      Learta Codding, MD,FACC  Electronically Signed   GS/MedQ  DD: 09/25/2006  DT: 09/25/2006  Job #: [id number]   cc:   Alfredia Client, MD

## 2010-10-28 NOTE — Discharge Summary (Signed)
NAMEJALIN, [name]              ACCOUNT NO.:  [id number]   MEDICAL RECORD NO.:  [id number]          PATIENT TYPE:  INP   LOCATION:  5033                         FACILITY:  [facility]   PHYSICIAN:  Lubertha Basque. Dalldorf, M.D.DATE OF BIRTH:  12-06-1950   DATE OF ADMISSION:  02/20/2004  DATE OF DISCHARGE:  02/26/2004                                 DISCHARGE SUMMARY   ADMITTING DIAGNOSES:  1.  Right knee infection.  2.  History of deep vein thrombosis.  3.  History of hypertension.  4.  History of diabetes mellitus type 2.  5.  History of depression.   DISCHARGE DIAGNOSES:  1.  Right knee infection.  2.  History of deep vein thrombosis.  3.  History of hypertension.  4.  History of diabetes mellitus type 2.  5.  History of depression.   OPERATIONS:  I&D, right knee arthroscopic lavage.   BRIEF HISTORY:  [name] is a 60 year old white female who has had a knee  arthroscopy on February 09, 2004, by Dr. Lubertha Basque. Dalldorf.  She did well  initially the first couple of days, and then started to have decreasing  discomfort.  She had been seen back in our office multiple times in the  postoperative stage, and her temperature was normal.  On one attempt to  aspirate her knee, it was negative.  No aspirate was taken.  On other  occasions, her knee had no significant effusions and did not appear to be an  infection.  However, this last time, on her presentation to the office, she  had increasing discomfort and was put into the hospital for further  treatment and evaluation of suspected possible knee infection status post  arthroscopy, right side.   PERTINENT LABORATORY AND X-RAY FINDINGS:  A venous Doppler was done, and  there was a small 1 cm DVT of the peroneal vein and calf.  Lumbar spine  films showed status post right cage procedure lumbar spine.  There were x-  ray views of her PICC line placement.  WBCs initially 13.5, later 7.7,  hemoglobin 12.6, hematocrit 35.7.  Neutrophils  initially were 87, higher  than last testing of 69.  Her ESR was 122.  Pro times were followed  serially.  She was on low-dose Coumadin protocol.  Her hemoglobin A1c was  5.7.  Potassium 3.3.  Sodium 137.  Creatinine 0.5. BUN 9.  AST 381, ALT 356,  ALP 394.  Total bilirubin 0.6.  Her joint aspirate grew Staphylococcus  aureus with multiple sensitivities.   HOSPITAL COURSE:  The patient was admitted, and a Doppler was done as stated  above.  Her knee was aspirated in the hospital and then sent to the lab  which grew out the Staphylococcus aureus culture results.  She was taken to  the operating room on September 12, and her knee underwent arthroscopic  lavage.  On postoperative day #1, Hemovac drains were in place.  They were  working and draining her knee.  Her lungs were clear.  Abdomen was soft with  positive bowel sounds.  Her hypertension was under control.  Her temperature  was 100.  She was getting IV Ancef at that time.  Drains were left in.  Pharmacy was also helpful in dosing Lovenox and Coumadin per deep vein  thrombosis protocol.  On postoperative day #2, she was using a morphine PCA  pump.  Temperature was 100.3.  Other vitals signs were stable.  Her calf was  soft.  Negative Homans' sign.  Dressing was intact and drains were to be  pulled on the third day postoperatively.  She continued with IV Kefzol.  On  postoperative day #3, her temperature was 100.4, blood pressure 122/71,  blood sugar 101.  Heart rate 83.  Cultures grew Staphylococcus aureus with  multiple sensitivities.  Drains were pulled that day.  Dressing was changed.  Normal neurovascular status to her toes.  Very painful knee motion, but  signs of infection were improving.  She was going to have a PICC line placed  and home IV Ancef 1 gram q.8h. x15 to 30 days, depending on her course, were  to be arranged.  On September 16, her temperature was 97.6, blood pressure  116/69.  INR 1.7.  Her pain was improved, and  her right knee motion was  about 10-80 degrees.  No active drainage.  The lungs were clear.  Her  abdomen was soft.  She was discharged home on September 16.   DISCHARGE CONDITION:  Improved.   CURRENT MEDICATIONS:  1.  Discharged on Percocet one to two every 4-6 hours for pain.  2.  Coumadin 5 mg tablets, take 1-1/2 tablets daily until instructed      otherwise by advanced home care who will be operating the home      prothrombin times and physical therapy.  3.  Her home medicines were unchanged, Ativan 1 mg q.8h. p.r.n. for anxiety;      Effexor 37.5 one, two times a day; HCTZ 25 mg, one a day; Protonix 40      mg, one a day; Zetia 10 mg, one a day; Toprol XL, 50, one a day; Zocor      40, one a day; Actos 50 mg at bedtime.   DISCHARGE DIET:  Resume her diabetic diet from her recapitalization state.   DISCHARGE INSTRUCTIONS:  Dressing can be changed daily.  Any signs of  increasing infection, drainage, pain, redness or swelling, call [phone].  Also call that same number for an appointment in one week's time.       MC/MEDQ  D:  03/24/2004  T:  03/24/2004  Job:  [id number]

## 2010-10-28 NOTE — Assessment & Plan Note (Signed)
[facility]                            [facility]ICE NOTE   NAME:[name], [name]                       MRN:          [id number]  DATE:04/25/2006                            DOB:          26-Jan-1951    REFERRING PHYSICIAN:  Alfredia Client, [name]   HISTORY OF PRESENT ILLNESS:  The patient is a 60 year old female with a  prior history of normal cardiac catheterization performed in 2004.  The  patient does have cardiac risk factors and in particular has a strong family  history of coronary artery disease.  She also has a history of dyslipidemia.  She was recently seen by Dr. Morrie Sheldon in the office and discussed the possibility  of proceeding with a cardiac CT scan.  The patient did not have any specific  complaints of chest pain or shortness of breath.  However, she was concerned  regarding her family history and also because she had recently heard from  one of her friends that she had a CT scan done in [place].  Subsequently, the patient was scheduled for a cardiac CT.  The study was  interpreted by Dr. Durene Cal and demonstrated the possibility of mild plaquing  of the left main coronary artery as well as a first diagonal branch.  Ejection fraction was 81%.  No other focal lesions were seen.  The patient  has now been referred to further discuss the results of her CT scan.   Again, as stated above, the patient does not report any chest pain,  shortness of breath, orthopnea, PND.  She has no exertional chest pain.  She  has no palpitations or syncope.   MEDICATIONS:  1. Lorazepam.  2. Effexor.  3. Metoprolol 50 mg twice a day.  4. Zetia 10 mg a day.  5. Niaspan 1000 mg ER tablet one a day.  6. Simvastatin 40 mg a day.  7. Fish oil 1000 mg a day.  8. Bayer Aspirin.  9. Stool softener.  10.She also takes a Vivelle patch.   PHYSICAL EXAMINATION:  VITAL SIGNS:  Blood pressure 138/80, heart rate 87  beats per minute, weight is 249 pounds.  NECK:   Normal carotid upstroke, no carotid bruits.  LUNGS:  Clear bilaterally>  HEART:  Regular rate and rhythm, normal S1 and S2.  No murmur, rubs or  gallops.  ABDOMEN:  Soft, nontender.  No rebound or guarding.  Good bowel sounds.  EXTREMITIES:  No cyanosis, clubbing or edema.  NEUROLOGIC:  The patient alert, oriented and grossly nonfocal.   PROBLEM LIST:  1. Abnormal cardiac CT scan with calcium score of 26 Agatston units.  2. Normal cardiac catheterization 2004.  3. Cardiac risk factors.  4. Incidental hernia by chest x-ray.   PLAN:  1. The CT angiographic interpretation is questionable at this point in      time.  The patient had a normal cardiac catheterization a couple of      years ago.  A cardiac CT is not particularly indicated to monitor the      patient for coronary artery disease, particularly in light of  the fact      that the patient is asymptomatic.  Cardiac CT is limited in its ability      to quantify stenosis.  In the absence of symptoms I do not think the      findings are significant.  2. However, in order to reassure the patient I have ordered an exercise      echocardiographic study.  If it is within normal limits, the patient      needs to continue further risk factor modification.     Learta Codding, MD,FACC  Electronically Signed    GED/MedQ  DD: 04/25/2006  DT: 04/25/2006  Job #: [id number]   cc:   Alfredia Client, [name]

## 2010-10-28 NOTE — H&P (Signed)
[place]  [name]:    [name]                      MRN: [id number] Adm. Date:  [id number] Attending:  Danella [name] Dictator:   [name]. [name], [name]                         History and Physical  CHIEF COMPLAINT:  [name] is a lady who has been seen by me for many years.  Back in 1991 she had a herniated disc at [name] level of L4-5 on [name] left side. She had been complaining lately of back pain with radiation down to [name] left side, although there was also some pain onto [name] right one.  In [name] left leg all [name] ain goes down to [name] left foot and is associated with a tingling sensation.  [name] feels she is getting weaker.  She failed conservative treatment which included epidural injection.  She had right L4-5 diskectomy and later on left L4-5. Because of [name] findings and because she is not any better surgery was advised.  PAST MEDICAL HISTORY:  L4 herniated disc, first on [name] right side and later on he left one.  She denies any problem with bladder or bowel.  FAMILY HISTORY:  Unremarkable.  PHYSICAL EXAMINATION:  [name] came to my office and she was limping slightly from [name] left leg.  She is obese.  HEENT:  Normal.  NECK:  Normal.  LUNGS:  Some mild rhonchi bilaterally.  HEART:  Heart sounds normal.  ABDOMEN:   Normal.  EXTREMITIES:  There are normal pulses though there is grade I edema.  NEUROLOGIC:  Mental status:  She is oriented x three.  Cranial nerves are normal. Strength is 5/5 with weakness with dorsiflexion of [name] left foot.  Repetitive movement of [name] left leg is quite difficult.  [name] straight leg raising in [name] left side is positive at 45 degrees and [name] right side at 60 degrees.  She has a mild sciatic notch, angled PCH tenderness on [name] left side and negative on [name] right. [name] has difficulty walking on tiptoes or heels.  She has decreased reflex of [name] lumbar spine.  She has  a scar from previous surgery.  [name] MRI showed that indeed she has a case of herniated disc disease at [name] level of L4-5 with stenosis.  CLINICAL IMPRESSION:  Recurrent L4-5 herniated disc.  RECOMMENDATION:  Talked to [name] at length.  Because this lady is having bilateral problems and in [name] past she had bilateral L4-5 diskectomy surgery is  being advised.  [name] procedure will be bilateral L4-5 facetectomy, decompression of [name] nerve root and fusion using Ray cages.  [name] knows [name] risks of surgery including infection, CSF leak, worsening of pain, paralysis, and need for further surgery.  [name] declined a second opinion. DD:  06/14/99 TD:  06/14/99 Job: [id number]

## 2010-11-02 ENCOUNTER — Encounter: Payer: Self-pay | Admitting: Nurse Practitioner

## 2011-01-17 ENCOUNTER — Ambulatory Visit: Payer: Medicaid Other | Admitting: Urology

## 2011-02-03 ENCOUNTER — Encounter: Payer: Self-pay | Admitting: Cardiology

## 2011-02-03 ENCOUNTER — Ambulatory Visit (INDEPENDENT_AMBULATORY_CARE_PROVIDER_SITE_OTHER): Payer: Medicaid Other | Admitting: Cardiology

## 2011-02-03 VITALS — BP 108/76 | HR 77 | Ht 66.0 in | Wt 216.0 lb

## 2011-02-03 DIAGNOSIS — I10 Essential (primary) hypertension: Secondary | ICD-10-CM

## 2011-02-03 DIAGNOSIS — E785 Hyperlipidemia, unspecified: Secondary | ICD-10-CM

## 2011-02-03 DIAGNOSIS — R0602 Shortness of breath: Secondary | ICD-10-CM

## 2011-02-03 DIAGNOSIS — I4510 Unspecified right bundle-branch block: Secondary | ICD-10-CM

## 2011-02-03 NOTE — Assessment & Plan Note (Signed)
Blood pressure well controlled I made no changes in her medical regimen

## 2011-02-03 NOTE — Patient Instructions (Signed)
Continue all current medications. Your physician wants you to follow up in:  1 year.  You will receive a reminder letter in the mail one-two months in advance.  If you don't receive a letter, please call our office to schedule the follow up appointment   

## 2011-02-03 NOTE — Assessment & Plan Note (Signed)
Followed by the patient's primary care physician 

## 2011-02-03 NOTE — Progress Notes (Signed)
HPI The patient is a 60 year old female with no prior history of coronary disease and normal coronary angiogram in 2004. She also had a stress test done couple of years ago which was within normal limits. The patient has a history of hypertension but this appears to be well-controlled. She has a history of anxiety and depression and at times feels really bad with episodes of shortness of breath, but no chest pain. She has limited exercise tolerance due to deconditioning. However she reports no symptoms consistent with angina. She has a chronic right bundle branch block by EKG.  Allergies  Allergen Reactions  . Benadryl Allergy   . Clindamycin     REACTION: caused C-diff per patient    Current Outpatient Prescriptions on File Prior to Visit  Medication Sig Dispense Refill  . aspirin (SB LOW DOSE ASA EC) 81 MG EC tablet Take 81 mg by mouth daily.        . Calcium Citrate-Vitamin D (CITRACAL + D PO) Take by mouth.        . chlorthalidone (HYGROTON) 25 MG tablet Take 25 mg by mouth 2 (two) times daily.       . Cholecalciferol (VITAMIN D-3 PO) Take 1 capsule by mouth daily.       . clonazePAM (KLONOPIN) 2 MG tablet Take 2 mg by mouth 2 (two) times daily as needed.        . cyclobenzaprine (FLEXERIL) 10 MG tablet Take 10 mg by mouth 3 (three) times daily as needed.       . cycloSPORINE (RESTASIS) 0.05 % ophthalmic emulsion 1 drop 2 (two) times daily.        [name] estradiol (ESTRACE) 0.1 MG/GM vaginal cream Place 2 g vaginally daily.        [name] ezetimibe-simvastatin (VYTORIN) 10-80 MG per tablet Take 1 tablet by mouth at bedtime.        . fish oil-omega-3 fatty acids 1000 MG capsule Take 2 g by mouth daily.        [name] lubiprostone (AMITIZA) 24 MCG capsule Take 24 mcg by mouth 2 (two) times daily with a meal.        . metoprolol (TOPROL-XL) 50 MG 24 hr tablet Take 50 mg by mouth daily.        . niacin (NIASPAN) 500 MG CR tablet Take 500 mg by mouth 3 (three) times daily.       [name] omeprazole (PRILOSEC) 20 MG  capsule Take 20 mg by mouth 2 (two) times daily.       . potassium chloride (KLOR-CON) 20 MEQ packet Take 20 mEq by mouth 2 (two) times daily.        [name] trimethoprim (TRIMPEX) 100 MG tablet Take 100 mg by mouth 2 (two) times daily. Take half tablet by mouth twice a day      . Venlafaxine HCl 150 MG TB24 Take 1 tablet by mouth daily.         Past Medical History  Diagnosis Date  . Borderline hypertension   . Obesity   . Hyperlipidemia   . Hypothyroidism     [name]   . Fe deficiency anemia   . Cardiomegaly   . Hiatal hernia   . PVT (paroxysmal ventricular tachycardia) 8/05    rt, leg ortho Sx   . BBB (bundle branch block)     as per EKG   . Recurrent cystitis     Dr. Aldean Ast   . Chronic lower back pain   .  Lumbar herniated disc     L4-L5   . History of mixed drug abuse   . Anxiety state, unspecified   . Acute cystitis   . Personal history of arthritis   . Other chronic cystitis   . Depressive disorder, not elsewhere classified   . Unspecified symptom associated with female genital organs   . Heart disease, unspecified   . Heartburn   . Microscopic hematuria   . Personal history of venous thrombosis and embolism     Past Surgical History  Procedure Date  . Appendectomy 2004?or 11/03?  [name] Cardiac catheterization 2004    negative   . Partial hysterectomy 2004    + one ovary   . Right knee arthroscopy 8/05  . Right knee replacement 8/05  . Carpal tunnel release     x2   . Shoulder surgery   . Roctocele- lomax 05/2001  . Liver biopsy 2002    Dr. Katrinka Blazing   . Bladder repair   . Rectal prolapse repair   . Back surgery     Family History  Problem Relation Age of Onset  . Heart attack      family history   . Coronary artery disease      Family History   . Breast cancer Mother   . Breast cancer Mother     History   Social History  . Marital Status: Single    Spouse Name: N/A    Number of Children: 5  . Years of Education: N/A   Occupational History  .  disabled     Social History Main Topics  . Smoking status: Never Smoker   . Smokeless tobacco: Never Used  . Alcohol Use: No  . Drug Use: Not on file     history of abuse   . Sexually Active: Not on file   Other Topics Concern  . Not on file   Social History Narrative  . No narrative on file   [id number] positives as outlined above. The remainder of the 18  point review of systems is negative  PHYSICAL EXAM BP 108/76  Pulse 77  Ht 5\' 6"  (1.676 m)  Wt 216 lb (97.977 kg)  BMI 34.86 kg/m2  General: Well-developed, well-nourished in no distress Head: Normocephalic and atraumatic Eyes:PERRLA/EOMI intact, conjunctiva and lids normal Ears: No deformity or lesions Mouth:normal dentition, normal posterior pharynx Neck: Supple, no JVD.  No masses, thyromegaly or abnormal cervical nodes Lungs: Normal breath sounds bilaterally without wheezing.  Normal percussion Cardiac: regular rate and rhythm with normal S1 and S2, no S3 or S4.  PMI is normal.  No pathological murmurs Abdomen: Normal bowel sounds, abdomen is soft and nontender without masses, organomegaly or hernias noted.  No hepatosplenomegaly MSK: Back normal, normal gait muscle strength and tone normal Vascular: Pulse is normal in all 4 extremities Extremities: No peripheral pitting edema Neurologic: Alert and oriented x 3 Skin: Intact without lesions or rashes Lymphatics: No significant adenopathy Psychologic: Depressed affect   ECG: Normal sinus rhythm with right bundle branch block  ASSESSMENT AND PLAN

## 2011-02-03 NOTE — Assessment & Plan Note (Signed)
Chronic dyspnea secondary to deconditioning.

## 2011-02-07 ENCOUNTER — Ambulatory Visit (INDEPENDENT_AMBULATORY_CARE_PROVIDER_SITE_OTHER): Payer: Medicaid Other | Admitting: Urology

## 2011-02-07 DIAGNOSIS — N949 Unspecified condition associated with female genital organs and menstrual cycle: Secondary | ICD-10-CM

## 2011-02-07 DIAGNOSIS — N3020 Other chronic cystitis without hematuria: Secondary | ICD-10-CM

## 2011-03-07 ENCOUNTER — Ambulatory Visit (INDEPENDENT_AMBULATORY_CARE_PROVIDER_SITE_OTHER): Payer: Medicaid Other | Admitting: Urology

## 2011-03-07 DIAGNOSIS — N952 Postmenopausal atrophic vaginitis: Secondary | ICD-10-CM

## 2011-03-07 DIAGNOSIS — N3020 Other chronic cystitis without hematuria: Secondary | ICD-10-CM

## 2011-05-15 ENCOUNTER — Other Ambulatory Visit: Payer: Self-pay | Admitting: Family Medicine

## 2011-05-15 DIAGNOSIS — Z1231 Encounter for screening mammogram for malignant neoplasm of breast: Secondary | ICD-10-CM

## 2011-05-23 ENCOUNTER — Ambulatory Visit (INDEPENDENT_AMBULATORY_CARE_PROVIDER_SITE_OTHER): Payer: Medicaid Other | Admitting: Urology

## 2011-05-23 DIAGNOSIS — N952 Postmenopausal atrophic vaginitis: Secondary | ICD-10-CM

## 2011-05-23 DIAGNOSIS — N3020 Other chronic cystitis without hematuria: Secondary | ICD-10-CM

## 2011-06-26 ENCOUNTER — Ambulatory Visit: Payer: Medicaid Other

## 2011-06-27 ENCOUNTER — Ambulatory Visit
Admission: RE | Admit: 2011-06-27 | Discharge: 2011-06-27 | Disposition: A | Discharge status: Home / Self Care | Payer: Medicaid Other | Source: Ambulatory Visit | Attending: Family Medicine | Admitting: Family Medicine

## 2011-06-27 DIAGNOSIS — Z1231 Encounter for screening mammogram for malignant neoplasm of breast: Secondary | ICD-10-CM

## 2012-02-05 ENCOUNTER — Encounter: Payer: Self-pay | Admitting: Cardiology

## 2012-02-05 ENCOUNTER — Ambulatory Visit (INDEPENDENT_AMBULATORY_CARE_PROVIDER_SITE_OTHER): Payer: Medicaid Other | Admitting: Cardiology

## 2012-02-05 VITALS — BP 113/66 | HR 78 | Ht 66.0 in | Wt 217.0 lb

## 2012-02-05 DIAGNOSIS — I471 Supraventricular tachycardia: Secondary | ICD-10-CM

## 2012-02-05 DIAGNOSIS — E785 Hyperlipidemia, unspecified: Secondary | ICD-10-CM

## 2012-02-05 DIAGNOSIS — I498 Other specified cardiac arrhythmias: Secondary | ICD-10-CM

## 2012-02-05 DIAGNOSIS — B070 Plantar wart: Secondary | ICD-10-CM | POA: Insufficient documentation

## 2012-02-05 DIAGNOSIS — R609 Edema, unspecified: Secondary | ICD-10-CM

## 2012-02-05 DIAGNOSIS — I10 Essential (primary) hypertension: Secondary | ICD-10-CM

## 2012-02-05 DIAGNOSIS — R079 Chest pain, unspecified: Secondary | ICD-10-CM

## 2012-02-05 MED ORDER — MAGNESIUM OXIDE 400 MG PO TABS: 400.0000 mg | ORAL_TABLET | Freq: Every day | ORAL | Status: DC

## 2012-02-05 MED ORDER — SALICYLIC ACID 0.5 % EX LIQD: CUTANEOUS | Status: DC

## 2012-02-05 MED ORDER — METOPROLOL SUCCINATE ER 50 MG PO TB24: 50.0000 mg | ORAL_TABLET | Freq: Two times a day (BID) | ORAL | Status: DC

## 2012-02-05 NOTE — Assessment & Plan Note (Signed)
Patient reports to me that she had elevated liver function test. She is on a combination Vytorin and niacin and I told her to discuss this with her primary care physician and be careful with this combination

## 2012-02-05 NOTE — Progress Notes (Signed)
[name] Bottoms, MD, [facility] Board Certified in Adult Cardiovascular Medicine,Internal Medicine and Critical Care Medicine    CC:    Routine followup patient with atypical chest pain                                                                              HPI:        The patient is a pleasant 61 year old female with no prior history of coronary artery disease. She had a cardiac catheterization 2004 as well as a stress echocardiogram in the last couple of years. She currently reports no chest pain. She has no orthopnea and PND. She reports occasional brief palpitations. As a matter of fact while auscultating her she had brief runs of rapid heart rate of 136 beats per minute. The patient has a history of supraventricular tachycardia. I told the patient that she can double on her metoprolol. She has had some problems with low potassium but this is handled by primary care physician. Reportedly she also had elevated LFTs and this may well be secondary to combination of Vytorin and niacin I also asked her to followup with her primary care physician. She asked me also for some treatment for her warts. Otherwise from a cardiovascular perspective the patient is stable and denies any orthopnea PND, chest pain or shortness of breath on exertion.   PMH: reviewed and listed in Problem List in Electronic Records (and see below) Past Medical History  Diagnosis Date  . Borderline hypertension   . Obesity   . Hyperlipidemia   . Hypothyroidism     [name]   . Fe deficiency anemia   . Cardiomegaly   . Hiatal hernia   . PVT (paroxysmal ventricular tachycardia) 8/05    rt, leg ortho Sx   . BBB (bundle branch block)     as per EKG   . Recurrent cystitis     Dr. Aldean Ast   . Chronic lower back pain   . Lumbar herniated disc     L4-L5   . History of mixed drug abuse   . Anxiety state, unspecified   . Acute cystitis   . Personal history of arthritis   . Other chronic cystitis   . Depressive  disorder, not elsewhere classified   . Unspecified symptom associated with female genital organs   . Heart disease, unspecified   . Heartburn   . Microscopic hematuria   . Personal history of venous thrombosis and embolism    Past Surgical History  Procedure Date  . Appendectomy 2004?or 11/03?  [name] Cardiac catheterization 2004    negative   . Partial hysterectomy 2004    + one ovary   . Right knee arthroscopy 8/05  . Right knee replacement 8/05  . Carpal tunnel release     x2   . Shoulder surgery   . Roctocele- lomax 05/2001  . Liver biopsy 2002    Dr. Katrinka Blazing   . Bladder repair   . Rectal prolapse repair   . Back surgery     Allergies/SH/FHX : available in Electronic Records for review  Allergies  Allergen Reactions  . Clindamycin     REACTION: caused C-diff per patient  .  Diphenhydramine Hcl    History   Social History  . Marital Status: Single    Spouse Name: N/A    Number of Children: 5  . Years of Education: N/A   Occupational History  . disabled     Social History Main Topics  . Smoking status: Never Smoker   . Smokeless tobacco: Never Used  . Alcohol Use: No  . Drug Use: Not on file     history of abuse   . Sexually Active: Not on file   Other Topics Concern  . Not on file   Social History Narrative  . No narrative on file   Family History  Problem Relation Age of Onset  . Heart attack      family history   . Coronary artery disease      Family History   . Breast cancer Mother   . Breast cancer Mother     Medications: Current Outpatient Prescriptions  Medication Sig Dispense Refill  . aspirin (SB LOW DOSE ASA EC) 81 MG EC tablet Take 81 mg by mouth daily.        . Calcium Citrate-Vitamin D (CITRACAL + D PO) Take by mouth.        . chlorthalidone (HYGROTON) 25 MG tablet Take 25 mg by mouth 2 (two) times daily.       . Cholecalciferol (VITAMIN D-3 PO) Take 1 capsule by mouth daily.       . clonazePAM (KLONOPIN) 2 MG tablet Take 2 mg by  mouth 2 (two) times daily as needed.        . cyclobenzaprine (FLEXERIL) 10 MG tablet Take 10 mg by mouth 3 (three) times daily as needed.       . cycloSPORINE (RESTASIS) 0.05 % ophthalmic emulsion 1 drop 2 (two) times daily.        [name] estradiol (ESTRACE) 0.1 MG/GM vaginal cream Place 2 g vaginally daily.        [name] ezetimibe-simvastatin (VYTORIN) 10-40 MG per tablet Take 1 tablet by mouth at bedtime.      . fish oil-omega-3 fatty acids 1000 MG capsule Take 2 g by mouth daily.        [name] levothyroxine (SYNTHROID, LEVOTHROID) 100 MCG tablet Take 100 mcg by mouth daily.        [name] lubiprostone (AMITIZA) 24 MCG capsule Take 24 mcg by mouth 2 (two) times daily with a meal.        . metoprolol succinate (TOPROL-XL) 50 MG 24 hr tablet Take 1 tablet (50 mg total) by mouth 2 (two) times daily.  60 tablet  11  . niacin (NIASPAN) 500 MG CR tablet Take 500 mg by mouth 3 (three) times daily.       [name] omeprazole (PRILOSEC) 20 MG capsule Take 20 mg by mouth 2 (two) times daily.       . potassium chloride (KLOR-CON) 20 MEQ packet Take 20 mEq by mouth 2 (two) times daily.        [name] trimethoprim (TRIMPEX) 100 MG tablet Take 100 mg by mouth 2 (two) times daily. Take half tablet by mouth twice a day      . Venlafaxine HCl 150 MG TB24 Take 1 tablet by mouth daily.       [name] DISCONTD: metoprolol (TOPROL-XL) 50 MG 24 hr tablet Take 50 mg by mouth daily.        . Salicylic Acid 0.5 % LIQD Apply topically as directed.  ROS: No nausea or vomiting. No fever or chills.No melena or hematochezia.No bleeding.No claudication  Physical Exam: BP 113/66  Pulse 78  Ht 5\' 6"  (1.676 m)  Wt 217 lb (98.431 kg)  BMI 35.02 kg/m2  SpO2 96% General: Well-nourished white female in no distress Neck: Normal carotid upstroke no carotid bruits. No thyromegaly nonnodular thyroid. JVP 6-10 cm. Lungs: Clear breath sounds bilaterally without wheezing.  Heart: Regular rate and rhythm with normal S1-S2 no murmur rubs or gallops on 2 occasions  rapid heart rate of 136 beats per minute Vascular: No edema. Normal distal pulses Skin: Warm and dry Physcologic: Normal affect  12lead ECG: Normal sinus rhythm otherwise normal tracing Limited bedside ECHO:N/A No images are attached to the encounter.   I reviewed and summarized the old records. I reviewed ECG and prior blood work.  Assessment and Plan  CHEST PAIN-UNSPECIFIED No recurrent chest pain. No further ischemia workup needed at the present time.  HYPERLIPIDEMIA-MIXED Patient reports to me that she had elevated liver function test. She is on a combination Vytorin and niacin and I told her to discuss this with her primary care physician and be careful with this combination  EDEMA Resolved  Supraventricular tachycardia, nonsustained Patient had brief runs of supraventricular tachycardia at 136 beats per minute she felt slight palpitations. He quickly and heart rate remained 65 in the office.dissipated. I asked the patient to increase her Toprol-XL to 50 mg by mouth twice a day  Plantar warts Told the patient to use salicylic acid over-the-counter    Patient Active Problem List  Diagnosis  . HYPERLIPIDEMIA-MIXED  . HYPERTENSION, UNSPECIFIED  . EDEMA  . DYSPNEA  . CHEST PAIN-UNSPECIFIED  . Right bundle branch block  . Plantar warts  . Supraventricular tachycardia, nonsustained

## 2012-02-05 NOTE — Assessment & Plan Note (Signed)
Patient had brief runs of supraventricular tachycardia at 136 beats per minute she felt slight palpitations. He quickly and heart rate remained 65 in the office.dissipated

## 2012-02-05 NOTE — Assessment & Plan Note (Signed)
Told the patient to use salicylic acid over-the-counter

## 2012-02-05 NOTE — Assessment & Plan Note (Signed)
Resolved

## 2012-02-05 NOTE — Patient Instructions (Addendum)
   Increase Toprol XL to 50mg  twice a day    Salicylic Acid - may buy OTC for warts on feet  Magnesium 400mg  daily - OTC Your physician wants you to follow up in:  1 year.  You will receive a reminder letter in the mail one-two months in advance.  If you don't receive a letter, please call our office to schedule the follow up appointment

## 2012-02-05 NOTE — Assessment & Plan Note (Addendum)
No recurrent chest pain. No further ischemia workup needed at the present time.

## 2012-04-03 ENCOUNTER — Encounter: Payer: Self-pay | Admitting: Physician Assistant

## 2012-04-03 ENCOUNTER — Ambulatory Visit (INDEPENDENT_AMBULATORY_CARE_PROVIDER_SITE_OTHER): Payer: Medicaid Other | Admitting: Physician Assistant

## 2012-04-03 VITALS — BP 97/68 | HR 81 | Ht 66.0 in | Wt 221.8 lb

## 2012-04-03 DIAGNOSIS — I471 Supraventricular tachycardia: Secondary | ICD-10-CM

## 2012-04-03 DIAGNOSIS — I498 Other specified cardiac arrhythmias: Secondary | ICD-10-CM

## 2012-04-03 DIAGNOSIS — R002 Palpitations: Secondary | ICD-10-CM

## 2012-04-03 NOTE — Assessment & Plan Note (Signed)
Will proceed with formal evaluation with a 21 day event monitor, for more definitive evaluation of recent history of PSVT. Patient has not previously worn a monitor. We'll also request most recent blood work, for monitoring of potassium, magnesium, and TSH level. Of note, patient is on supplemental thyroid medication. Will schedule early clinic followup for review of monitor results, and further recommendations. For now, patient is to continue current dose of Toprol, which was up titrated at time of last OV for treatment of PSVT.

## 2012-04-03 NOTE — Progress Notes (Signed)
Primary Cardiologist: Simona Huh, MD (new)   HPI: Patient presents as an add-on today for evaluation of palpitations with documented tachycardia and bradycardia.   She was last seen here in clinic in August 2013, by Dr. Andee Lineman, for evaluation of atypical CP. She has no known CAD. When last seen, she was diagnosed with PSVT at 136 bpm, during the examination. Toprol was increased to 50 mg daily. She was also placed on magnesium 400 mg daily.  She was seen by primary [name] yesterday for complaint of weakness, smothering, and sensation of feeling "hot" with diaphoresis. EKGs were done and notable for ST approximately 110 bpm, with frequent PACs and possible junctional complexes.  Clinically, she has had intermittent palpitations since her last OV, but these seemed more pronounced over this past weekend. She does report associated DOE, dizziness, but no CP or frank syncope.   12-lead EKG today, reviewed by me, indicates NSR 81 bpm, preceded by short burst of sinus versus narrow complex tachycardia  Allergies  Allergen Reactions  . Clindamycin     REACTION: caused C-diff per patient  . Diphenhydramine Hcl     Current Outpatient Prescriptions  Medication Sig Dispense Refill  . aspirin (SB LOW DOSE ASA EC) 81 MG EC tablet Take 81 mg by mouth daily.        . Calcium Citrate-Vitamin D (CITRACAL + D PO) Take by mouth.        . chlorthalidone (HYGROTON) 25 MG tablet Take 25 mg by mouth 2 (two) times daily.       . Cholecalciferol (VITAMIN D-3 PO) Take 1 capsule by mouth daily.       . clonazePAM (KLONOPIN) 2 MG tablet Take 2 mg by mouth 2 (two) times daily as needed.        . cyclobenzaprine (FLEXERIL) 10 MG tablet Take 10 mg by mouth 3 (three) times daily as needed.        . cycloSPORINE (RESTASIS) 0.05 % ophthalmic emulsion 1 drop 2 (two) times daily.        [name] estradiol (ESTRACE) 0.1 MG/GM vaginal cream Place 2 g vaginally daily.        [name] ezetimibe-simvastatin (VYTORIN) 10-40 MG per tablet  Take 1 tablet by mouth at bedtime.      . fish oil-omega-3 fatty acids 1000 MG capsule Take 2 g by mouth daily.        [name] levothyroxine (SYNTHROID, LEVOTHROID) 100 MCG tablet Take 100 mcg by mouth daily.        [name] lubiprostone (AMITIZA) 24 MCG capsule Take 24 mcg by mouth 2 (two) times daily with a meal.        . magnesium oxide (MAG-OX 400) 400 MG tablet Take 1 tablet (400 mg total) by mouth daily.      . metoprolol succinate (TOPROL-XL) 50 MG 24 hr tablet Take 1 tablet (50 mg total) by mouth 2 (two) times daily.  60 tablet  11  . niacin (NIASPAN) 500 MG CR tablet Take 1,500 mg by mouth at bedtime.       [name] omeprazole (PRILOSEC) 20 MG capsule Take 20 mg by mouth 2 (two) times daily.       . potassium chloride (KLOR-CON) 20 MEQ packet Take 20 mEq by mouth 2 (two) times daily.        [name] trimethoprim (TRIMPEX) 100 MG tablet Take 100 mg by mouth 2 (two) times daily. Take half tablet by mouth twice a day      . Venlafaxine  HCl 150 MG TB24 Take 1 tablet by mouth daily.         Past Medical History  Diagnosis Date  . Borderline hypertension   . Obesity   . Hyperlipidemia   . Hypothyroidism     [name]   . Fe deficiency anemia   . Cardiomegaly   . Hiatal hernia   . PVT (paroxysmal ventricular tachycardia) 8/05    rt, leg ortho Sx   . BBB (bundle branch block)     as per EKG   . Recurrent cystitis     Dr. Aldean Ast   . Chronic lower back pain   . Lumbar herniated disc     L4-L5   . History of mixed drug abuse   . Anxiety state, unspecified   . Acute cystitis   . Personal history of arthritis   . Other chronic cystitis   . Depressive disorder, not elsewhere classified   . Unspecified symptom associated with female genital organs   . Heart disease, unspecified   . Heartburn   . Microscopic hematuria   . Personal history of venous thrombosis and embolism     Past Surgical History  Procedure Date  . Appendectomy 2004?or 11/03?  [name] Cardiac catheterization 2004    negative   .  Partial hysterectomy 2004    + one ovary   . Right knee arthroscopy 8/05  . Right knee replacement 8/05  . Carpal tunnel release     x2   . Shoulder surgery   . Roctocele- lomax 05/2001  . Liver biopsy 2002    Dr. Katrinka Blazing   . Bladder repair   . Rectal prolapse repair   . Back surgery     History   Social History  . Marital Status: Single    Spouse Name: N/A    Number of Children: 5  . Years of Education: N/A   Occupational History  . disabled     Social History Main Topics  . Smoking status: Never Smoker   . Smokeless tobacco: Never Used  . Alcohol Use: No  . Drug Use: Not on file     history of abuse   . Sexually Active: Not on file   Other Topics Concern  . Not on file   Social History Narrative  . No narrative on file    Family History  Problem Relation Age of Onset  . Heart attack      family history   . Coronary artery disease      Family History   . Breast cancer Mother   . Breast cancer Mother     ROS: no nausea, vomiting; no fever, chills; no melena, hematochezia; no claudication  PHYSICAL EXAM: BP 97/68  Pulse 81  Ht 5\' 6"  (1.676 m)  Wt 221 lb 12.8 oz (100.608 kg)  BMI 35.80 kg/m2 GENERAL: 61 year old female, obese; NAD HEENT: NCAT, PERRLA, EOMI; sclera clear; no xanthelasma NECK: palpable bilateral carotid pulses, no bruits; no JVD; no TM LUNGS: CTA bilaterally CARDIAC: RRR (S1, S2); no significant murmurs; no rubs or gallops ABDOMEN: Protuberant EXTREMETIES: no significant peripheral edema SKIN: warm/dry; no obvious rash/lesions MUSCULOSKELETAL: no joint deformity NEURO: no focal deficit; NL affect   EKG: reviewed and available in Electronic Records   ASSESSMENT & PLAN:  Supraventricular tachycardia, nonsustained Will proceed with formal evaluation with a 21 day event monitor, for more definitive evaluation of recent history of PSVT. Patient has not previously worn a monitor. We'll also request most recent blood  work, for  monitoring of potassium, magnesium, and TSH level. Of note, patient is on supplemental thyroid medication. Will schedule early clinic followup for review of monitor results, and further recommendations. For now, patient is to continue current dose of Toprol, which was up titrated at time of last OV for treatment of PSVT.    Gene Arcola Freshour, PAC

## 2012-04-03 NOTE — Patient Instructions (Signed)
   21 day e-cardio heart monitor - will be mailed to your home Continue all current medications. Follow up in  1 month

## 2012-04-04 ENCOUNTER — Other Ambulatory Visit: Payer: Self-pay | Admitting: *Deleted

## 2012-04-04 DIAGNOSIS — R002 Palpitations: Secondary | ICD-10-CM

## 2012-04-08 DIAGNOSIS — R002 Palpitations: Secondary | ICD-10-CM

## 2012-04-29 ENCOUNTER — Telehealth: Payer: Self-pay | Admitting: Cardiology

## 2012-04-29 NOTE — Telephone Encounter (Signed)
Patient called in wanting results of monitor that she just finished on Sunday 11-17. I told the patient that it could take up to 72 hours or longer before we received the results and after that the doctor would have to review it before we could let her know. I told her that the results may be given to her before her appointment via phone call by one of the nurses or that there is a chance that we may not have the results until the day of the appointment.

## 2012-05-01 ENCOUNTER — Telehealth: Payer: Self-pay | Admitting: Cardiology

## 2012-05-01 NOTE — Telephone Encounter (Signed)
[name] called today requesting test results of her heart monitor

## 2012-05-01 NOTE — Telephone Encounter (Signed)
Left message to return call 

## 2012-05-02 ENCOUNTER — Encounter: Payer: Self-pay | Admitting: *Deleted

## 2012-05-02 NOTE — Telephone Encounter (Signed)
E-cardio 21 day heart monitor - NSR w/ transient EAT approx. 150bpm, no definite atrial fib / flutter.  - per Rozell Searing, PA    Patient notified of stable monitor results.  Already has follow up scheduled for Tuesday, 11/26 with Dr. Diona Browner.

## 2012-05-06 ENCOUNTER — Telehealth: Payer: Self-pay | Admitting: Physician Assistant

## 2012-05-06 NOTE — Telephone Encounter (Signed)
Patient rescheduled OV to 12/12.  Copy of monitor forwarded to PMD at patient request.

## 2012-05-06 NOTE — Telephone Encounter (Signed)
[name] called and cancelled her appt for 11-26 due to weather conditions. She would like copy of results of 21 day e-cardio heart monitor   Sent to [facility].

## 2012-05-07 ENCOUNTER — Ambulatory Visit: Payer: Medicaid Other | Admitting: Cardiology

## 2012-05-08 ENCOUNTER — Telehealth: Payer: Self-pay | Admitting: Cardiology

## 2012-05-08 NOTE — Telephone Encounter (Signed)
Needs monitor results

## 2012-05-08 NOTE — Telephone Encounter (Signed)
Left message on voice mail - already give pt results & forwarded to PMD.

## 2012-05-17 ENCOUNTER — Other Ambulatory Visit: Payer: Self-pay | Admitting: Family Medicine

## 2012-05-17 DIAGNOSIS — Z1231 Encounter for screening mammogram for malignant neoplasm of breast: Secondary | ICD-10-CM

## 2012-05-22 ENCOUNTER — Encounter: Payer: Self-pay | Admitting: Physician Assistant

## 2012-05-22 ENCOUNTER — Ambulatory Visit (INDEPENDENT_AMBULATORY_CARE_PROVIDER_SITE_OTHER): Payer: Medicaid Other | Admitting: Physician Assistant

## 2012-05-22 VITALS — BP 111/67 | HR 73 | Ht 66.0 in | Wt 223.8 lb

## 2012-05-22 DIAGNOSIS — I471 Supraventricular tachycardia: Secondary | ICD-10-CM

## 2012-05-22 DIAGNOSIS — I498 Other specified cardiac arrhythmias: Secondary | ICD-10-CM

## 2012-05-22 NOTE — Patient Instructions (Signed)
Continue all current medications. Your physician wants you to follow up in: 6 months.  You will receive a reminder letter in the mail one-two months in advance.  If you don't receive a letter, please call our office to schedule the follow up appointment   

## 2012-05-22 NOTE — Assessment & Plan Note (Signed)
No further workup indicated. The results of the recent event monitor were discussed with the patient, and recommendation is to continue current medication regimen. Patient's episodes are infrequent, essentially asymptomatic, and of very brief duration. We'll await recent results from primary MDs office, regarding electrolytes and TSH level.

## 2012-05-22 NOTE — Progress Notes (Signed)
Primary Cardiologist: Simona Huh, MD   HPI: Scheduled one-month followup.  When last seen, I ordered a 21 day event monitor to rule out recurrent SVT. This yielded essentially NSR with transient EAT approximately at 150 bpm. There was no definite atrial fibrillation/flutter.  Results of the event monitor were discussed with the patient, who noted only one brief episode of tachycardia palpitations while wearing it. Overall, she suggests only 2-3 episodes since her last OV, and which are essentially asymptomatic and very brief in duration.  Current Outpatient Prescriptions  Medication Sig Dispense Refill  . aspirin (SB LOW DOSE ASA EC) 81 MG EC tablet Take 81 mg by mouth daily.        . Calcium Citrate-Vitamin D (CITRACAL + D PO) Take 1 tablet by mouth daily.       . chlorthalidone (HYGROTON) 25 MG tablet Take 25 mg by mouth 2 (two) times daily.       . Cholecalciferol (VITAMIN D-3 PO) Take 1 capsule by mouth daily.       . clonazePAM (KLONOPIN) 2 MG tablet Take 2 mg by mouth 2 (two) times daily as needed.        . cyclobenzaprine (FLEXERIL) 10 MG tablet Take 10 mg by mouth 3 (three) times daily as needed.        . cycloSPORINE (RESTASIS) 0.05 % ophthalmic emulsion 1 drop 2 (two) times daily.        [name] estradiol (ESTRACE) 0.1 MG/GM vaginal cream Place 2 g vaginally daily.        [name] ezetimibe-simvastatin (VYTORIN) 10-40 MG per tablet Take 1 tablet by mouth at bedtime.      . fish oil-omega-3 fatty acids 1000 MG capsule Take 2 g by mouth daily.        [name] levothyroxine (SYNTHROID, LEVOTHROID) 100 MCG tablet Take 100 mcg by mouth daily.        [name] lubiprostone (AMITIZA) 24 MCG capsule Take 24 mcg by mouth 2 (two) times daily with a meal.        . magnesium oxide (MAG-OX 400) 400 MG tablet Take 1 tablet (400 mg total) by mouth daily.      . metoprolol succinate (TOPROL-XL) 50 MG 24 hr tablet Take 1 tablet (50 mg total) by mouth 2 (two) times daily.  60 tablet  11  . niacin (NIASPAN) 500 MG CR tablet  Take 1,500 mg by mouth at bedtime.       [name] omeprazole (PRILOSEC) 20 MG capsule Take 20 mg by mouth 2 (two) times daily.       . potassium chloride (KLOR-CON) 20 MEQ packet Take 20 mEq by mouth 2 (two) times daily.        [name] trimethoprim (TRIMPEX) 100 MG tablet Take 50 mg by mouth 2 (two) times daily.       . Venlafaxine HCl 150 MG TB24 Take 1 tablet by mouth daily.         Past Medical History  Diagnosis Date  . Borderline hypertension   . Obesity   . Hyperlipidemia   . Hypothyroidism     [name]   . Fe deficiency anemia   . Cardiomegaly     NL LVF (EF 55-60%), diastolic dysfunction, no significant valve abnormalities, 02/2010  . Hiatal hernia   . PVT (paroxysmal ventricular tachycardia) 8/05    rt, leg ortho Sx   . BBB (bundle branch block)     as per EKG   . Recurrent cystitis  Dr. Aldean Ast   . Chronic lower back pain   . Lumbar herniated disc     L4-L5   . History of mixed drug abuse   . Anxiety state, unspecified   . Acute cystitis   . Personal history of arthritis   . Other chronic cystitis   . Depressive disorder, not elsewhere classified   . Unspecified symptom associated with female genital organs   . Chest pain     Negative dobutamine stress echo, 06/2008; NL cardiac catheterization, 2004  . Heartburn   . Microscopic hematuria   . Personal history of venous thrombosis and embolism     Past Surgical History  Procedure Date  . Appendectomy 2004?or 11/03?  [name] Cardiac catheterization 2004    negative   . Partial hysterectomy 2004    + one ovary   . Right knee arthroscopy 8/05  . Right knee replacement 8/05  . Carpal tunnel release     x2   . Shoulder surgery   . Roctocele- lomax 05/2001  . Liver biopsy 2002    Dr. Katrinka Blazing   . Bladder repair   . Rectal prolapse repair   . Back surgery     History   Social History  . Marital Status: Single    Spouse Name: N/A    Number of Children: 5  . Years of Education: N/A   Occupational History  . disabled      Social History Main Topics  . Smoking status: Never Smoker   . Smokeless tobacco: Never Used  . Alcohol Use: No  . Drug Use: Not on file     Comment: history of abuse   . Sexually Active: Not on file   Other Topics Concern  . Not on file   Social History Narrative  . No narrative on file   Social History Narrative  . No narrative on file    Problem Relation Age of Onset  . Heart attack      family history   . Coronary artery disease      Family History   . Breast cancer Mother   . Breast cancer Mother     ROS: no nausea, vomiting; no fever, chills; no melena, hematochezia; no claudication  PHYSICAL EXAM: BP 111/67  Pulse 73  Ht 5\' 6"  (1.676 m)  Wt 223 lb 12.8 oz (101.515 kg)  BMI 36.12 kg/m2  SpO2 98% GENERAL: 61 year old female, obese; NAD  HEENT: NCAT, PERRLA, EOMI; sclera clear; no xanthelasma  NECK: palpable bilateral carotid pulses, no bruits; no JVD; no TM  LUNGS: CTA bilaterally  CARDIAC: RRR (S1, S2); no significant murmurs; no rubs or gallops  ABDOMEN: Protuberant  EXTREMETIES: no significant peripheral edema  SKIN: warm/dry; no obvious rash/lesions  MUSCULOSKELETAL: no joint deformity  NEURO: no focal deficit; NL affect    EKG:   ASSESSMENT & PLAN:  Supraventricular tachycardia, nonsustained No further workup indicated. The results of the recent event monitor were discussed with the patient, and recommendation is to continue current medication regimen. Patient's episodes are infrequent, essentially asymptomatic, and of very brief duration. Moreover, she has documented history of normal LVF, by prior echocardiography. We'll await recent results from primary MDs office, regarding electrolytes and TSH level.    Gene Minal Stuller, PAC

## 2012-06-20 ENCOUNTER — Encounter (HOSPITAL_COMMUNITY): Payer: Self-pay | Admitting: Emergency Medicine

## 2012-06-20 ENCOUNTER — Emergency Department (HOSPITAL_COMMUNITY)
Admission: EM | Admit: 2012-06-20 | Discharge: 2012-06-20 | Disposition: A | Discharge status: Home / Self Care | Payer: Medicaid Other | Attending: Emergency Medicine | Admitting: Emergency Medicine

## 2012-06-20 ENCOUNTER — Emergency Department (HOSPITAL_COMMUNITY): Payer: Medicaid Other

## 2012-06-20 DIAGNOSIS — M545 Low back pain, unspecified: Secondary | ICD-10-CM | POA: Insufficient documentation

## 2012-06-20 DIAGNOSIS — Z8659 Personal history of other mental and behavioral disorders: Secondary | ICD-10-CM | POA: Insufficient documentation

## 2012-06-20 DIAGNOSIS — E669 Obesity, unspecified: Secondary | ICD-10-CM | POA: Insufficient documentation

## 2012-06-20 DIAGNOSIS — R079 Chest pain, unspecified: Secondary | ICD-10-CM | POA: Insufficient documentation

## 2012-06-20 DIAGNOSIS — Z87448 Personal history of other diseases of urinary system: Secondary | ICD-10-CM | POA: Insufficient documentation

## 2012-06-20 DIAGNOSIS — G8929 Other chronic pain: Secondary | ICD-10-CM | POA: Insufficient documentation

## 2012-06-20 DIAGNOSIS — E785 Hyperlipidemia, unspecified: Secondary | ICD-10-CM | POA: Insufficient documentation

## 2012-06-20 DIAGNOSIS — Z86718 Personal history of other venous thrombosis and embolism: Secondary | ICD-10-CM | POA: Insufficient documentation

## 2012-06-20 DIAGNOSIS — F1910 Other psychoactive substance abuse, uncomplicated: Secondary | ICD-10-CM | POA: Insufficient documentation

## 2012-06-20 DIAGNOSIS — E039 Hypothyroidism, unspecified: Secondary | ICD-10-CM | POA: Insufficient documentation

## 2012-06-20 DIAGNOSIS — Z8679 Personal history of other diseases of the circulatory system: Secondary | ICD-10-CM | POA: Insufficient documentation

## 2012-06-20 DIAGNOSIS — I10 Essential (primary) hypertension: Secondary | ICD-10-CM | POA: Insufficient documentation

## 2012-06-20 DIAGNOSIS — F411 Generalized anxiety disorder: Secondary | ICD-10-CM | POA: Insufficient documentation

## 2012-06-20 DIAGNOSIS — Z7982 Long term (current) use of aspirin: Secondary | ICD-10-CM | POA: Insufficient documentation

## 2012-06-20 DIAGNOSIS — Z8739 Personal history of other diseases of the musculoskeletal system and connective tissue: Secondary | ICD-10-CM | POA: Insufficient documentation

## 2012-06-20 DIAGNOSIS — Z8719 Personal history of other diseases of the digestive system: Secondary | ICD-10-CM | POA: Insufficient documentation

## 2012-06-20 DIAGNOSIS — Z79899 Other long term (current) drug therapy: Secondary | ICD-10-CM | POA: Insufficient documentation

## 2012-06-20 LAB — COMPREHENSIVE METABOLIC PANEL
ALT: 58 U/L — ABNORMAL HIGH (ref 0–35)
BUN: 22 mg/dL (ref 6–23)
CO2: 26 mEq/L (ref 19–32)
Calcium: 9.8 mg/dL (ref 8.4–10.5)
Creatinine, Ser: 0.92 mg/dL (ref 0.50–1.10)
GFR calc Af Amer: 76 mL/min — ABNORMAL LOW (ref >90)
GFR calc non Af Amer: 66 mL/min — ABNORMAL LOW (ref >90)
Glucose, Bld: 94 mg/dL (ref 70–99)

## 2012-06-20 LAB — CBC WITH DIFFERENTIAL/PLATELET
Eosinophils Relative: 1 % (ref 0–5)
HCT: 41.8 % (ref 36.0–46.0)
Lymphocytes Relative: 37 % (ref 12–46)
Lymphs Abs: 2.5 10*3/uL (ref 0.7–4.0)
MCV: 87.8 fL (ref 78.0–100.0)
Monocytes Absolute: 0.5 10*3/uL (ref 0.1–1.0)
Monocytes Relative: 7 % (ref 3–12)
RBC: 4.76 MIL/uL (ref 3.87–5.11)
WBC: 6.7 10*3/uL (ref 4.0–10.5)

## 2012-06-20 NOTE — ED Notes (Signed)
Pt RBBB on EMS ekg. Pt reports she has had a previous heart cath.

## 2012-06-20 NOTE — ED Notes (Signed)
Pt back from radiology, placed back on monitor.

## 2012-06-20 NOTE — ED Notes (Signed)
EMS started and 22G on left hand. EMS administered 324 ASA and 2 nitro. After meds pt reports pain dropped to 4/10.

## 2012-06-20 NOTE — ED Provider Notes (Signed)
History     CSN: [id number]  Arrival date & time 06/20/12  1352   First MD Initiated Contact with Patient 06/20/12 1435      Chief Complaint  Patient presents with  . Chest Pain    (Consider location/radiation/quality/duration/timing/severity/associated sxs/prior treatment) Patient is a 62 y.o. female presenting with chest pain. The history is provided by the patient (the pt complains of some left side chest pain). No language interpreter was used.  Chest Pain The chest pain began less than 1 hour ago. Chest pain occurs rarely. The chest pain is resolved. Associated with: nothing. At its most intense, the pain is at 4/10. The pain is currently at 2/10. The severity of the pain is moderate. The quality of the pain is described as aching. The pain does not radiate. Chest pain is worsened by stress. Pertinent negatives for primary symptoms include no fatigue, no cough and no abdominal pain.  Pertinent negatives for past medical history include no seizures.     Past Medical History  Diagnosis Date  . Borderline hypertension   . Obesity   . Hyperlipidemia   . Hypothyroidism     [name]   . Fe deficiency anemia   . Cardiomegaly     NL LVF (EF 55-60%), diastolic dysfunction, no significant valvular abnormalities, 02/2010  . Hiatal hernia   . PVT (paroxysmal ventricular tachycardia) 8/05    rt, leg ortho Sx   . BBB (bundle branch block)     as per EKG   . Recurrent cystitis     Dr. Aldean Ast   . Chronic lower back pain   . Lumbar herniated disc     L4-L5   . History of mixed drug abuse   . Anxiety state, unspecified   . Acute cystitis   . Personal history of arthritis   . Other chronic cystitis   . Depressive disorder, not elsewhere classified   . Unspecified symptom associated with female genital organs   . Chest pain     Negative dobutamine stress echo, 06/2008; NL cardiac catheterization, 2004  . Heartburn   . Microscopic hematuria   . Personal history of venous  thrombosis and embolism     Past Surgical History  Procedure Date  . Appendectomy 2004?or 11/03?  [name] Cardiac catheterization 2004    negative   . Partial hysterectomy 2004    + one ovary   . Right knee arthroscopy 8/05  . Right knee replacement 8/05  . Carpal tunnel release     x2   . Shoulder surgery   . Roctocele- lomax 05/2001  . Liver biopsy 2002    Dr. Katrinka Blazing   . Bladder repair   . Rectal prolapse repair   . Back surgery     Family History  Problem Relation Age of Onset  . Heart attack      family history   . Coronary artery disease      Family History   . Breast cancer Mother   . Breast cancer Mother     History  Substance Use Topics  . Smoking status: Never Smoker   . Smokeless tobacco: Never Used  . Alcohol Use: No    OB History    Grav Para Term Preterm Abortions TAB SAB Ect Mult Living                  Review of Systems  Constitutional: Negative for fatigue.  HENT: Negative for congestion, sinus pressure and ear discharge.  Eyes: Negative for discharge.  Respiratory: Negative for cough.   Cardiovascular: Positive for chest pain.  Gastrointestinal: Negative for abdominal pain and diarrhea.  Genitourinary: Negative for frequency and hematuria.  Musculoskeletal: Negative for back pain.  Skin: Negative for rash.  Neurological: Negative for seizures and headaches.  Hematological: Negative.   Psychiatric/Behavioral: Negative for hallucinations.    Allergies  Clindamycin and Diphenhydramine hcl  Home Medications   Current Outpatient Rx  Name  Route  Sig  Dispense  Refill  . ASPIRIN EC 81 MG PO TBEC   Oral   Take 81 mg by mouth daily.         [name] CITRACAL + D PO   Oral   Take 1 tablet by mouth every morning.          [name] CHLORTHALIDONE 25 MG PO TABS   Oral   Take 25 mg by mouth 2 (two) times daily.          [name] VITAMIN D-3 PO   Oral   Take 1 capsule by mouth every morning.          [name] CLONAZEPAM 2 MG PO TABS   Oral   Take 2 mg by  mouth 2 (two) times daily as needed. For anxiety         . CYCLOBENZAPRINE HCL 10 MG PO TABS   Oral   Take 10 mg by mouth 3 (three) times daily as needed. For muscle spasms         . CYCLOSPORINE 0.05 % OP EMUL   Both Eyes   Place 1 drop into both eyes 2 (two) times daily.          [name] ESTRADIOL 0.1 MG/GM VA CREA   Vaginal   Place 2 g vaginally daily.          [name] EZETIMIBE-SIMVASTATIN 10-40 MG PO TABS   Oral   Take 1 tablet by mouth at bedtime.         [name] LEVOTHYROXINE SODIUM 100 MCG PO TABS   Oral   Take 100 mcg by mouth daily.           . LUBIPROSTONE 24 MCG PO CAPS   Oral   Take 24 mcg by mouth 2 (two) times daily with a meal.          . MAGNESIUM OXIDE 400 MG PO TABS   Oral   Take 1 tablet (400 mg total) by mouth daily.         [name] METOPROLOL SUCCINATE ER 50 MG PO TB24   Oral   Take 50 mg by mouth 2 (two) times daily. Take with or immediately following a meal.         . NIACIN ER (ANTIHYPERLIPIDEMIC) 500 MG PO TBCR   Oral   Take 1,500 mg by mouth at bedtime.          . OMEGA-3-ACID ETHYL ESTERS 1 G PO CAPS   Oral   Take 1 g by mouth 2 (two) times daily.         [name] OMEPRAZOLE 20 MG PO CPDR   Oral   Take 20 mg by mouth 2 (two) times daily.          [name] POTASSIUM CHLORIDE CRYS ER 20 MEQ PO TBCR   Oral   Take 20 mEq by mouth 3 (three) times daily.         [name] TRIMETHOPRIM 100 MG PO TABS   Oral   Take 50 mg  by mouth daily.          . VENLAFAXINE HCL ER 150 MG PO TB24   Oral   Take 1 tablet by mouth every morning.            BP 125/47  Pulse 66  Temp 97.8 F (36.6 C) (Oral)  Resp 14  SpO2 97%  Physical Exam  Constitutional: She is oriented to person, place, and time. She appears well-developed.  HENT:  Head: Normocephalic and atraumatic.  Eyes: Conjunctivae normal and EOM are normal. No scleral icterus.  Neck: Neck supple. No thyromegaly present.  Cardiovascular: Normal rate and regular rhythm.  Exam reveals no gallop and no  friction rub.   No murmur heard. Pulmonary/Chest: No stridor. She has no wheezes. She has no rales. She exhibits no tenderness.  Abdominal: She exhibits no distension. There is no tenderness. There is no rebound.  Musculoskeletal: Normal range of motion. She exhibits no edema.  Lymphadenopathy:    She has no cervical adenopathy.  Neurological: She is oriented to person, place, and time. Coordination normal.  Skin: No rash noted. No erythema.  Psychiatric: She has a normal mood and affect. Her behavior is normal.    ED Course  Procedures (including critical care time)  Labs Reviewed  COMPREHENSIVE METABOLIC PANEL - Abnormal; Notable for the following:    Potassium 3.3 (*)     AST 42 (*)     ALT 58 (*)     GFR calc non Af Amer 66 (*)     GFR calc Af Amer 76 (*)     All other components within normal limits  CBC WITH DIFFERENTIAL  TROPONIN I   Dg Chest 2 View  06/20/2012  *RADIOLOGY REPORT*  Clinical Data: Lambert Mody left-sided chest pain  CHEST - 2 VIEW  Comparison: Chest x-ray of 11/21/2004  Findings: No active infiltrate or effusion is seen.  Minimal peribronchial thickening is noted.  The heart is mildly enlarged and stable.  There is a small hiatal hernia present.  There are degenerative changes throughout the thoracic spine.  IMPRESSION:  1.  No pneumonia.  Question mild bronchitis. 2.  Stable cardiomegaly. 3.  Small hiatal hernia.   Original Report Authenticated By: Dwyane Dee, [name]      1. Chest pain     Date: 06/20/2012  Rate: 64  Rhythm: normal sinus rhythm  QRS Axis: normal  Intervals: normal  ST/T Wave abnormalities: normal  Conduction Disutrbances:none  Narrative Interpretation:   Old EKG Reviewed: none available     MDM          Benny Lennert, MD 06/20/12 1850

## 2012-06-20 NOTE — ED Notes (Signed)
Pt reports she was having sharp pain to left upper chest with radiation to left arm now the pain is mostly gone but feels like her left arm is heavy and hard to pick up. Pt in nad. Pt has eqaul hand grips, no facial droop, no arm drift.

## 2012-06-20 NOTE — ED Notes (Signed)
Per EMS - pt had new onset of chest discomfort that started about 3 hrs. Pt denies n/v/sob along with discomfort. Pt a&oX4. Pt in nad. Pt has chronic back pain from previous sx. BP 146/76  HR 70-140s pt reports she has hx of similar hr.  RR 18.

## 2012-06-27 ENCOUNTER — Ambulatory Visit
Admission: RE | Admit: 2012-06-27 | Discharge: 2012-06-27 | Disposition: A | Discharge status: Home / Self Care | Payer: Medicaid Other | Source: Ambulatory Visit | Attending: Family Medicine | Admitting: Family Medicine

## 2012-06-27 DIAGNOSIS — Z1231 Encounter for screening mammogram for malignant neoplasm of breast: Secondary | ICD-10-CM

## 2012-07-02 ENCOUNTER — Ambulatory Visit: Payer: Medicaid Other | Admitting: Urology

## 2012-07-16 ENCOUNTER — Ambulatory Visit (INDEPENDENT_AMBULATORY_CARE_PROVIDER_SITE_OTHER): Payer: Medicaid Other | Admitting: Urology

## 2012-07-16 DIAGNOSIS — N3020 Other chronic cystitis without hematuria: Secondary | ICD-10-CM

## 2012-07-16 DIAGNOSIS — N952 Postmenopausal atrophic vaginitis: Secondary | ICD-10-CM

## 2012-09-11 ENCOUNTER — Other Ambulatory Visit: Payer: Self-pay | Admitting: *Deleted

## 2012-09-11 MED ORDER — NIACIN ER (ANTIHYPERLIPIDEMIC) 500 MG PO TBCR: 1500.0000 mg | EXTENDED_RELEASE_TABLET | Freq: Every day | ORAL | Status: DC

## 2012-09-11 MED ORDER — VENLAFAXINE HCL ER 150 MG PO TB24: 1.0000 | ORAL_TABLET | Freq: Every morning | ORAL | Status: DC

## 2012-09-11 MED ORDER — CYCLOBENZAPRINE HCL 10 MG PO TABS: 10.0000 mg | ORAL_TABLET | Freq: Three times a day (TID) | ORAL | Status: DC | PRN

## 2012-09-17 ENCOUNTER — Encounter: Payer: Self-pay | Admitting: Obstetrics & Gynecology

## 2012-09-17 ENCOUNTER — Ambulatory Visit (INDEPENDENT_AMBULATORY_CARE_PROVIDER_SITE_OTHER): Payer: Medicaid Other | Admitting: Obstetrics & Gynecology

## 2012-09-17 VITALS — BP 140/90 | HR 65 | Resp 16 | Ht 65.0 in | Wt 222.0 lb

## 2012-09-17 DIAGNOSIS — R300 Dysuria: Secondary | ICD-10-CM

## 2012-09-17 DIAGNOSIS — Z01419 Encounter for gynecological examination (general) (routine) without abnormal findings: Secondary | ICD-10-CM

## 2012-09-17 NOTE — Patient Instructions (Signed)

## 2012-09-17 NOTE — Progress Notes (Signed)
Patient ID: [name], female   DOB: 1951-04-08, 62 y.o.   MRN: [id number]  Chief Complaint  Patient presents with  . Gynecologic Exam    Pt states painful urination started 1 day ago    HPI [name] is a 62 y.o. female.  [id number] Hysterectomy for heavy bleeding in the 1990's. Deep dyspareunia.   HPI  Past Medical History  Diagnosis Date  . Borderline hypertension   . Obesity   . Hyperlipidemia   . Hypothyroidism     [name]   . Fe deficiency anemia   . Cardiomegaly     NL LVF (EF 55-60%), diastolic dysfunction, no significant valvular abnormalities, 02/2010  . Hiatal hernia   . PVT (paroxysmal ventricular tachycardia) 8/05    rt, leg ortho Sx   . BBB (bundle branch block)     as per EKG   . Recurrent cystitis     Dr. Aldean Ast   . Chronic lower back pain   . Lumbar herniated disc     L4-L5   . History of mixed drug abuse   . Anxiety state, unspecified   . Acute cystitis   . Personal history of arthritis   . Other chronic cystitis   . Depressive disorder, not elsewhere classified   . Unspecified symptom associated with female genital organs   . Chest pain     Negative dobutamine stress echo, 06/2008; NL cardiac catheterization, 2004  . Heartburn   . Microscopic hematuria   . Personal history of venous thrombosis and embolism     Past Surgical History  Procedure Laterality Date  . Appendectomy  2004?or 11/03?  [name] Cardiac catheterization  2004    negative   . Partial hysterectomy  2004    + one ovary   . Right knee arthroscopy  8/05  . Right knee replacement  8/05  . Carpal tunnel release      x2   . Shoulder surgery    . Roctocele- lomax  05/2001  . Liver biopsy  2002    Dr. Katrinka Blazing   . Bladder repair    . Rectal prolapse repair    . Back surgery      Family History  Problem Relation Age of Onset  . Heart attack      family history   . Coronary artery disease      Family History   . Breast cancer Mother   . Breast cancer Mother      Social History History  Substance Use Topics  . Smoking status: Never Smoker   . Smokeless tobacco: Never Used  . Alcohol Use: No    Allergies  Allergen Reactions  . Clindamycin     REACTION: caused C-diff per patient  . Diphenhydramine Hcl Other (See Comments)    REACTION:  Severely drowsy    Current Outpatient Prescriptions  Medication Sig Dispense Refill  . aspirin EC 81 MG tablet Take 81 mg by mouth daily.      . Calcium Citrate-Vitamin D (CITRACAL + D PO) Take 1 tablet by mouth every morning.       . chlorthalidone (HYGROTON) 25 MG tablet Take 25 mg by mouth 2 (two) times daily.       . Cholecalciferol (VITAMIN D-3 PO) Take 1 capsule by mouth every morning.       . clonazePAM (KLONOPIN) 2 MG tablet Take 2 mg by mouth 2 (two) times daily as needed. For anxiety      . cyclobenzaprine (FLEXERIL)  10 MG tablet Take 1 tablet (10 mg total) by mouth 3 (three) times daily as needed. For muscle spasms  90 tablet  1  . cycloSPORINE (RESTASIS) 0.05 % ophthalmic emulsion Place 1 drop into both eyes 2 (two) times daily.       [name] estradiol (ESTRACE) 0.1 MG/GM vaginal cream Place 2 g vaginally daily.       [name] ezetimibe-simvastatin (VYTORIN) 10-40 MG per tablet Take 1 tablet by mouth at bedtime.      [name] levothyroxine (SYNTHROID, LEVOTHROID) 100 MCG tablet Take 100 mcg by mouth daily.        [name] lubiprostone (AMITIZA) 24 MCG capsule Take 24 mcg by mouth 2 (two) times daily with a meal.       . magnesium oxide (MAG-OX 400) 400 MG tablet Take 1 tablet (400 mg total) by mouth daily.      . metoprolol succinate (TOPROL-XL) 50 MG 24 hr tablet Take 50 mg by mouth 2 (two) times daily. Take with or immediately following a meal.      . niacin (NIASPAN) 500 MG CR tablet Take 3 tablets (1,500 mg total) by mouth at bedtime.  90 tablet  2  . omega-3 acid ethyl esters (LOVAZA) 1 G capsule Take 1 g by mouth 2 (two) times daily.      [name] omeprazole (PRILOSEC) 20 MG capsule Take 20 mg by mouth 2 (two) times daily.        . potassium chloride SA (K-DUR,KLOR-CON) 20 MEQ tablet Take 20 mEq by mouth 3 (three) times daily.      [name] trimethoprim (TRIMPEX) 100 MG tablet Take 50 mg by mouth daily.       . Venlafaxine HCl 150 MG TB24 Take 1 tablet (150 mg total) by mouth every morning.  30 each  1   No current facility-administered medications for this visit.    Review of Systems Review of Systems  Constitutional: Negative.   Gastrointestinal: Negative for nausea, abdominal pain and constipation.  Genitourinary: Positive for dyspareunia. Negative for vaginal bleeding, vaginal discharge and pelvic pain.    Blood pressure 140/90, pulse 65, resp. rate 16, height 5\' 5"  (1.651 m), weight 222 lb (100.699 kg).  Physical Exam Physical Exam  Constitutional: She is oriented to person, place, and time. No distress.  Obese   Pulmonary/Chest: Effort normal. No respiratory distress.  Breasts symmetric no mass  Abdominal: Soft. She exhibits no distension and no mass. There is no tenderness.  Genitourinary: No vaginal discharge found.  Mild atrophy, narrow and foreshortened vault, no lesion or mass, mild discomfort at cuff   Neurological: She is alert and oriented to person, place, and time.  Skin: Skin is warm and dry.  Psychiatric: She has a normal mood and affect. Her behavior is normal.    Data Reviewed Meds, history  Assessment    Dyspareunia likely due to decreased vaginal depth and diameter post hysterectomy and 2 vaginal repair procedures    Plan Wet prep    Continue Estrace cream as directed        [name] 09/17/2012, 3:32 PM

## 2012-09-18 LAB — URINALYSIS
Protein, ur: NEGATIVE mg/dL
Urobilinogen, UA: 0.2 mg/dL (ref 0.0–1.0)

## 2012-09-18 LAB — WET PREP BY MOLECULAR PROBE: Candida species: NEGATIVE

## 2012-09-23 ENCOUNTER — Telehealth: Payer: Self-pay | Admitting: Family Medicine

## 2012-09-24 ENCOUNTER — Encounter: Payer: Self-pay | Admitting: Family Medicine

## 2012-09-24 NOTE — Telephone Encounter (Signed)
PT JUST HAD A QUESTION ABOUT PAP VS. PELVIC EXAMS- PT AWARE OF THE DIFFERENCE- COMING IN NEXT WEEK TO SEE WONG AND HAVE LABS DRAWN.

## 2012-09-26 ENCOUNTER — Telehealth: Payer: Self-pay | Admitting: Obstetrics & Gynecology

## 2012-10-03 ENCOUNTER — Ambulatory Visit (INDEPENDENT_AMBULATORY_CARE_PROVIDER_SITE_OTHER): Payer: Medicaid Other | Admitting: Family Medicine

## 2012-10-03 ENCOUNTER — Encounter: Payer: Self-pay | Admitting: Family Medicine

## 2012-10-03 VITALS — BP 129/74 | HR 69 | Temp 97.7°F | Ht 63.5 in | Wt 224.4 lb

## 2012-10-03 DIAGNOSIS — R5381 Other malaise: Secondary | ICD-10-CM

## 2012-10-03 DIAGNOSIS — R5383 Other fatigue: Secondary | ICD-10-CM | POA: Insufficient documentation

## 2012-10-03 DIAGNOSIS — E669 Obesity, unspecified: Secondary | ICD-10-CM

## 2012-10-03 DIAGNOSIS — I10 Essential (primary) hypertension: Secondary | ICD-10-CM

## 2012-10-03 DIAGNOSIS — Z0000 Encounter for general adult medical examination without abnormal findings: Secondary | ICD-10-CM

## 2012-10-03 DIAGNOSIS — R8290 Unspecified abnormal findings in urine: Secondary | ICD-10-CM

## 2012-10-03 DIAGNOSIS — E785 Hyperlipidemia, unspecified: Secondary | ICD-10-CM

## 2012-10-03 DIAGNOSIS — R82998 Other abnormal findings in urine: Secondary | ICD-10-CM

## 2012-10-03 LAB — POCT CBC
Granulocyte percent: 55 %G (ref 37–80)
HCT, POC: 41.4 % (ref 37.7–47.9)
Hemoglobin: 14.6 g/dL (ref 12.2–16.2)
Lymph, poc: 2.8 (ref 0.6–3.4)
MCH, POC: 31.4 pg — AB (ref 27–31.2)
MCHC: 35.2 g/dL (ref 31.8–35.4)
MCV: 89.3 fL (ref 80–97)
MPV: 7 fL (ref 0–99.8)
POC Granulocyte: 4.2 (ref 2–6.9)
POC LYMPH PERCENT: 36.7 %L (ref 10–50)
Platelet Count, POC: 180 10*3/uL (ref 142–424)
RBC: 4.6 M/uL (ref 4.04–5.48)
RDW, POC: 12.4 %
WBC: 7.7 10*3/uL (ref 4.6–10.2)

## 2012-10-03 LAB — POCT UA - MICROSCOPIC ONLY
Bacteria, U Microscopic: NEGATIVE
Casts, Ur, LPF, POC: NEGATIVE
Crystals, Ur, HPF, POC: NEGATIVE
Mucus, UA: NEGATIVE
RBC, urine, microscopic: NEGATIVE
Yeast, UA: NEGATIVE

## 2012-10-03 LAB — POCT URINALYSIS DIPSTICK
Bilirubin, UA: NEGATIVE
Blood, UA: NEGATIVE
Glucose, UA: NEGATIVE
Ketones, UA: NEGATIVE
Leukocytes, UA: NEGATIVE
Nitrite, UA: NEGATIVE
Protein, UA: NEGATIVE
Spec Grav, UA: <=1.005
Urobilinogen, UA: NEGATIVE
pH, UA: 7.5

## 2012-10-03 NOTE — Patient Instructions (Addendum)
      Dr Woodroe Mode Recommendations  Diet and Exercise discussed with patient.  For nutrition information, I recommend books:  1).Eat to Live by Dr Monico Hoar. 2).Prevent and Reverse Heart Disease by Dr Suzzette Righter.  Exercise recommendations are:  If unable to walk, then the patient can exercise in a chair 3 times a day. By flapping arms like a bird gently and raising legs outwards to the front.  If ambulatory, the patient can go for walks for 30 minutes 3 times a week. Then increase the intensity and duration as tolerated.  Goal is to try to attain exercise frequency to 5 times a week.  If applicable: Best to perform resistance exercises (machines or weights) 2 days a week and cardio type exercises 3 days per week.   Consider Tai Chi at the [facility] for  Exercise.  Immunizations: Tetanus-Diphtheria Booster [id number] Pertusis Booster [phone] Flu Shot Due: in the Fall Pneumonia Vaccine:at 62 years old Herpes Zoster/Shingles Vaccine due: due now HPV due:  Healthy Life Habits: Exercise Goal: 5-6 days/week; start gradually(ie 30 minutes/3days per week) Nutrition: Balanced healthy meals including Vegetables and Fruits. Consider  Reading the following books:1) Eat to Live by Dr Louie Casa) Prevent and Reverse Heart Disease by Dr Suzzette Righter. Vitamins:multivitamin Aspirin:81 mg Stop Tobacco JYN:WGNFA Seat Belt Use:++++ Sunscreen Use:+++ Osteoporosis Prevention: 1) Exercise 2) Calcium/Vitamin D requirements:he [employer] of the [employer] recommends:    Calcium:  800 mg/day for children 83-84 years of age          61 mg/day for children 53-4 years of age          61 mg/day for adults 88-50 years of age          61 mg/day for everyone more than 61 years of age     Vitamin D: 800 IU per day or as prescribed if you are deficient.  Recommended Screening Tests: Colon Cancer Screening:2023 Blood work: Cholesterol  Screening:     ++   HIV:    ++     Hepatitis C(people born 1945-1965):++ Mammogram: anually DEXA/Bone Density:2010 GYN Exam:with GYN Monthly Self Breast Exam:+++ Monthly Self Testicular Exam:n/a Eye Exam:annually Dental Health:every 6 months  Others: Living Will/Healthcare Power of Attorney:++++++

## 2012-10-03 NOTE — Progress Notes (Signed)
Patient ID: [name], female   DOB: 1950-10-13, 62 y.o.   MRN: [id number] SUBJECTIVE: HPI: Annual physical Multiple medical problems Her major problem is reactive depression. Two other relatives are in the hospital critically ill and most likely will die. She grieves for lost family and relatives. Not suicidal , not hallucinating. Doesn't do much but grieves with other family members on a continuous basis.weight gain and poor diet and lack of exercise adds to her dissatisfaction with her life. Poor motivation to make any difference and prefers to come and talk with a physician than see a therapist.    PMH/PSH: reviewed/updated in Epic  SH/FH: reviewed/updated in Epic  Allergies: reviewed/updated in Epic  Medications: reviewed/updated in Epic  Immunizations: reviewed/updated in Epic  ROS: As above in the HPI. All other systems are stable or negative.  OBJECTIVE: APPEARANCE: White female obese Patient in no acute distress.The patient appeared well nourished and normally developed. Acyanotic. Waist:38 1/4 inches VITAL SIGNS:BP 129/74  Pulse 69  Temp(Src) 97.7 F (36.5 C) (Oral)  Ht 5' 3.5" (1.613 m)  Wt 224 lb 6.4 oz (101.787 kg)  BMI 39.12 kg/m2   SKIN: warm and  Dry without overt rashes, tattoos and scars  HEAD and Neck: without JVD, Head and scalp: normal Eyes:No scleral icterus. Fundi normal, eye movements normal. Ears: Auricle normal, canal normal, Tympanic membranes normal, insufflation normal. Nose: normal Throat: normal Neck & thyroid: normal  CHEST & LUNGS: Chest wall: normal Lungs: Clear  CVS: Reveals the PMI to be normally located. Regular rhythm, First and Second Heart sounds are normal,  absence of murmurs, rubs or gallops. Peripheral vasculature: Radial pulses: normal Dorsal pedis pulses: normal Posterior pulses: normal  ABDOMEN:  Appearance: normal Benign,, no organomegaly, no masses, no Abdominal Aortic enlargement. No Guarding , no  rebound. No Bruits. Bowel sounds: normal  RECTAL: N/A GU: N/A  EXTREMETIES: nonedematous. Both Femoral and Pedal pulses are normal.  MUSCULOSKELETAL:  Spine: normal Joints: intact  NEUROLOGIC: oriented to time,place and person; nonfocal. Strength is normal Sensory is normal Reflexes are normal Cranial Nerves are normal.  ASSESSMENT: PE (physical exam), annual - Plan: POCT urinalysis dipstick  HTN (hypertension)  HLD (hyperlipidemia) - Plan: COMPLETE METABOLIC PANEL WITH GFR, NMR Lipoprofile with Lipids  Obesity, unspecified  Other malaise and fatigue - Plan: POCT CBC, COMPLETE METABOLIC PANEL WITH GFR, TSH, Vitamin D 25 hydroxy, Vitamin B12, Folate  Abnormal urinalysis - Plan: POCT UA - Microscopic Only, Urine culture   PLAN:      Dr Woodroe Mode Recommendations  Diet and Exercise discussed with patient.  For nutrition information, I recommend books:  1).Eat to Live by Dr Monico Hoar. 2).Prevent and Reverse Heart Disease by Dr Suzzette Righter.  Exercise recommendations are:  If unable to walk, then the patient can exercise in a chair 3 times a day. By flapping arms like a bird gently and raising legs outwards to the front.  If ambulatory, the patient can go for walks for 30 minutes 3 times a week. Then increase the intensity and duration as tolerated.  Goal is to try to attain exercise frequency to 5 times a week.  If applicable: Best to perform resistance exercises (machines or weights) 2 days a week and cardio type exercises 3 days per week.   Consider Tai Chi at the [facility] for  Exercise.  Immunizations: Tetanus-Diphtheria Booster [id number] Pertusis Booster [id number] Flu Shot Due: in the Fall Pneumonia Vaccine:at 63 years old Herpes Zoster/Shingles Vaccine due: due now HPV  due:  Healthy Life Habits: Exercise Goal: 5-6 days/week; start gradually(ie 30 minutes/3days per week) Nutrition: Balanced healthy meals including Vegetables and  Fruits. Consider  Reading the following books:1) Eat to Live by Dr Louie Casa) Prevent and Reverse Heart Disease by Dr Suzzette Righter. Vitamins:multivitamin Aspirin:81 mg Stop Tobacco ZOX:WRUEA Seat Belt Use:++++ Sunscreen Use:+++ Osteoporosis Prevention: 1) Exercise 2) Calcium/Vitamin D requirements:he [employer] of the [employer] recommends:    Calcium:  800 mg/day for children 42-32 years of age          61 mg/day for children 15-63 years of age          61 mg/day for adults 29-39 years of age          61 mg/day for everyone more than 61 years of age     Vitamin D: 800 IU per day or as prescribed if you are deficient.  Recommended Screening Tests: Colon Cancer Screening:2023 Blood work: Cholesterol Screening:     ++   HIV:    ++     Hepatitis C(people born 1945-1965):++ Mammogram: anually DEXA/Bone Density:2010 GYN Exam:with GYN Monthly Self Breast Exam:+++ Monthly Self Testicular Exam:n/a Eye Exam:annually Dental Health:every 6 months  Others: Living Will/Healthcare Power of Attorney:++++++   Meds ordered this encounter  Medications  . DISCONTD: magnesium oxide (MAGOX 400) 400 (241.3 MG) MG tablet    Sig: Take by mouth. Take 1 tablet (400 mg total) by mouth daily.  . Calcium Carbonate-Vitamin D (CALCIUM 500 + D) 500-125 MG-UNIT TABS    Sig: Take by mouth. Take 1 tablet by mouth every morning.  [name] DISCONTD: Omega-3 Fatty Acids (FISH OIL) 1000 MG CAPS    Sig: Take by mouth. Take 1 g by mouth 2 (two) times daily.  [name] aspirin EC 81 MG tablet    Sig: Take by mouth. Take 81 mg by mouth daily.  [name] DISCONTD: chlorthalidone (HYGROTON) 25 MG tablet    Sig: Take by mouth. Take 25 mg by mouth 2 (two) times daily.  . clonazePAM (KLONOPIN) 2 MG tablet    Sig: Take by mouth. Take 2 mg by mouth 2 (two) times daily as needed. For anxiety  . DISCONTD: cyclobenzaprine (FLEXERIL) 10 MG tablet    Sig: Take by mouth. Take 10 mg by mouth 3 (three) times  daily as needed. For muscle spasms  . cycloSPORINE (RESTASIS) 0.05 % ophthalmic emulsion    Sig: Apply to eye. Place 1 drop into both eyes 2 (two) times daily.  [name] estradiol (ESTRACE) 0.1 MG/GM vaginal cream    Sig: Place vaginally. Place 2 g vaginally daily.  [name] DISCONTD: ezetimibe-simvastatin (VYTORIN) 10-40 MG per tablet    Sig: Take by mouth. Take 1 tablet by mouth at bedtime.  [name] levothyroxine (SYNTHROID, LEVOTHROID) 100 MCG tablet    Sig: Take by mouth. Take 100 mcg by mouth daily.  [name] DISCONTD: lubiprostone (AMITIZA) 24 MCG capsule    Sig: Take by mouth. Take 24 mcg by mouth 2 (two) times daily with a meal.  . DISCONTD: metoprolol succinate (TOPROL-XL) 50 MG 24 hr tablet    Sig: Take by mouth. Take 50 mg by mouth 2 (two) times daily. Take with or immediately following a meal.  . DISCONTD: niacin (NIASPAN) 500 MG CR tablet    Sig: Take by mouth. Take 1,500 mg by mouth at bedtime.  [name] DISCONTD: omeprazole (PRILOSEC) 20 MG capsule    Sig: Take by mouth. Take 20 mg by mouth 2 (  two) times daily.  [name] DISCONTD: potassium chloride SA (K-DUR,KLOR-CON) 20 MEQ tablet    Sig: Take by mouth. Take 20 mEq by mouth 3 (three) times daily.  [name] trimethoprim (TRIMPEX) 100 MG tablet    Sig: Take by mouth. Take 50 mg by mouth daily.  [name] DISCONTD: Venlafaxine HCl 150 MG TB24    Sig: Take by mouth. Take 1 tablet by mouth every morning.   Orders Placed This Encounter  Procedures  . Urine culture  . COMPLETE METABOLIC PANEL WITH GFR  . TSH  . NMR Lipoprofile with Lipids  . Vitamin D 25 hydroxy  . Vitamin B12  . Folate  . POCT urinalysis dipstick  . POCT CBC  . POCT UA - Microscopic Only  offerred referral to therapist=patient declined.  Reviewed diet, exercise and a healthy lifestyle.  RTc in 3 months.  Keola Heninger P. [place] Charon, [name]

## 2012-10-04 LAB — COMPLETE METABOLIC PANEL WITH GFR
ALT: 35 U/L (ref 0–35)
AST: 30 U/L (ref 0–37)
Albumin: 3.9 g/dL (ref 3.5–5.2)
Alkaline Phosphatase: 73 U/L (ref 39–117)
BUN: 18 mg/dL (ref 6–23)
CO2: 27 mEq/L (ref 19–32)
Calcium: 9.8 mg/dL (ref 8.4–10.5)
Chloride: 103 mEq/L (ref 96–112)
Creat: 1 mg/dL (ref 0.50–1.10)
GFR, Est African American: 70 mL/min
GFR, Est Non African American: 61 mL/min
Glucose, Bld: 78 mg/dL (ref 70–99)
Potassium: 3.9 mEq/L (ref 3.5–5.3)
Sodium: 140 mEq/L (ref 135–145)
Total Bilirubin: 0.5 mg/dL (ref 0.3–1.2)
Total Protein: 6.5 g/dL (ref 6.0–8.3)

## 2012-10-04 LAB — NMR LIPOPROFILE WITH LIPIDS
Cholesterol, Total: 158 mg/dL (ref <200)
HDL Particle Number: 48.9 umol/L (ref >=30.5)
HDL Size: 9.9 nm (ref >=9.2)
HDL-C: 72 mg/dL (ref >=40)
LDL (calc): 65 mg/dL (ref <100)
LDL Particle Number: 653 nmol/L (ref <1000)
LDL Size: 20.6 nm (ref >20.5)
LP-IR Score: 35 (ref <=45)
Large HDL-P: 15 umol/L (ref >=4.8)
Large VLDL-P: 3.7 nmol/L — ABNORMAL HIGH (ref <=2.7)
Small LDL Particle Number: 287 nmol/L (ref <=527)
Triglycerides: 107 mg/dL (ref <150)
VLDL Size: 50.6 nm — ABNORMAL HIGH (ref <=46.6)

## 2012-10-04 LAB — VITAMIN D 25 HYDROXY (VIT D DEFICIENCY, FRACTURES): Vit D, 25-Hydroxy: 28 ng/mL — ABNORMAL LOW (ref 30–89)

## 2012-10-04 LAB — VITAMIN B12: Vitamin B-12: 681 pg/mL (ref 211–911)

## 2012-10-04 LAB — TSH: TSH: 2.376 u[IU]/mL (ref 0.350–4.500)

## 2012-10-04 LAB — FOLATE: Folate: >20 ng/mL

## 2012-10-05 LAB — URINE CULTURE

## 2012-10-06 NOTE — Progress Notes (Signed)
Quick Note:  Lab result at goal. Except for the Vitamin D. Which is borderline low. She can increase to 2 Vitamin D capsules daily. Will recheck at next visit No change in the other Medications for now. No Change in plans and follow up. ______

## 2012-10-07 ENCOUNTER — Telehealth: Payer: Self-pay | Admitting: Family Medicine

## 2012-10-07 NOTE — Telephone Encounter (Signed)
Pt.notified

## 2012-10-07 NOTE — Telephone Encounter (Signed)
Pt notified of labs that were drawn on 10-03-12

## 2012-10-10 ENCOUNTER — Telehealth: Payer: Self-pay | Admitting: Family Medicine

## 2012-10-10 ENCOUNTER — Encounter: Payer: Self-pay | Admitting: *Deleted

## 2012-10-11 ENCOUNTER — Other Ambulatory Visit: Payer: Self-pay | Admitting: *Deleted

## 2012-10-11 MED ORDER — CLONAZEPAM 2 MG PO TABS: 2.0000 mg | ORAL_TABLET | Freq: Two times a day (BID) | ORAL | Status: DC | PRN

## 2012-10-11 NOTE — Telephone Encounter (Signed)
Last seen 08/05/12 by FPW,last filled 08/12/12

## 2012-10-30 ENCOUNTER — Other Ambulatory Visit: Payer: Self-pay | Admitting: *Deleted

## 2012-10-30 MED ORDER — EZETIMIBE-SIMVASTATIN 10-40 MG PO TABS: 1.0000 | ORAL_TABLET | Freq: Every day | ORAL | Status: DC

## 2012-11-11 ENCOUNTER — Other Ambulatory Visit: Payer: Self-pay

## 2012-11-11 MED ORDER — OMEPRAZOLE 20 MG PO CPDR: 20.0000 mg | DELAYED_RELEASE_CAPSULE | Freq: Two times a day (BID) | ORAL | Status: DC

## 2012-11-11 MED ORDER — CHLORTHALIDONE 25 MG PO TABS: 25.0000 mg | ORAL_TABLET | Freq: Two times a day (BID) | ORAL | Status: DC

## 2012-11-11 MED ORDER — VENLAFAXINE HCL ER 150 MG PO TB24: 1.0000 | ORAL_TABLET | Freq: Every morning | ORAL | Status: DC

## 2012-11-11 MED ORDER — TRIAMTERENE-HCTZ 37.5-25 MG PO CAPS: 1.0000 | ORAL_CAPSULE | ORAL | Status: DC

## 2012-11-11 MED ORDER — CYCLOSPORINE 0.05 % OP EMUL: 1.0000 [drp] | Freq: Two times a day (BID) | OPHTHALMIC | Status: DC

## 2012-11-11 MED ORDER — CYCLOBENZAPRINE HCL 10 MG PO TABS: 10.0000 mg | ORAL_TABLET | Freq: Three times a day (TID) | ORAL | Status: DC | PRN

## 2012-11-12 ENCOUNTER — Other Ambulatory Visit: Payer: Self-pay

## 2012-11-12 NOTE — Telephone Encounter (Signed)
Last visit 10/03/12  Last filled 10/11/12  If approved call in and have nurse notify patient

## 2012-11-13 MED ORDER — CLONAZEPAM 2 MG PO TABS: 2.0000 mg | ORAL_TABLET | Freq: Two times a day (BID) | ORAL | Status: DC | PRN

## 2012-11-19 NOTE — Telephone Encounter (Signed)
Error

## 2012-11-20 ENCOUNTER — Encounter: Payer: Self-pay | Admitting: *Deleted

## 2012-11-22 ENCOUNTER — Ambulatory Visit: Payer: Medicaid Other | Admitting: Cardiology

## 2012-12-11 ENCOUNTER — Encounter: Payer: Self-pay | Admitting: *Deleted

## 2012-12-11 ENCOUNTER — Encounter: Payer: Self-pay | Admitting: Physician Assistant

## 2012-12-11 ENCOUNTER — Other Ambulatory Visit: Payer: Self-pay | Admitting: *Deleted

## 2012-12-11 ENCOUNTER — Ambulatory Visit (INDEPENDENT_AMBULATORY_CARE_PROVIDER_SITE_OTHER): Payer: Medicaid Other | Admitting: Physician Assistant

## 2012-12-11 VITALS — BP 114/76 | HR 69 | Ht 65.0 in | Wt 226.0 lb

## 2012-12-11 DIAGNOSIS — I471 Supraventricular tachycardia: Secondary | ICD-10-CM

## 2012-12-11 DIAGNOSIS — I498 Other specified cardiac arrhythmias: Secondary | ICD-10-CM

## 2012-12-11 DIAGNOSIS — R0602 Shortness of breath: Secondary | ICD-10-CM

## 2012-12-11 MED ORDER — NIACIN ER (ANTIHYPERLIPIDEMIC) 500 MG PO TBCR: 1500.0000 mg | EXTENDED_RELEASE_TABLET | Freq: Every day | ORAL | Status: DC

## 2012-12-11 MED ORDER — POTASSIUM CHLORIDE CRYS ER 20 MEQ PO TBCR: 20.0000 meq | EXTENDED_RELEASE_TABLET | Freq: Three times a day (TID) | ORAL | Status: DC

## 2012-12-11 MED ORDER — METOPROLOL SUCCINATE ER 50 MG PO TB24: 50.0000 mg | ORAL_TABLET | Freq: Two times a day (BID) | ORAL | Status: DC

## 2012-12-11 MED ORDER — CLONAZEPAM 2 MG PO TABS: 2.0000 mg | ORAL_TABLET | Freq: Two times a day (BID) | ORAL | Status: DC | PRN

## 2012-12-11 NOTE — Progress Notes (Signed)
Primary Cardiologist: Simona Huh, MD   HPI: Scheduled six-month followup.   She presents with no interim development of exertional CP. However, she suggests worsening DOE. She denies orthopnea, PND, but does have intermittent peripheral edema.  She denies recurrent tachycardia palpitations, since last OV.   Allergies  Allergen Reactions  . Clindamycin     REACTION: caused C-diff per patient  . Diphenhydramine Hcl Other (See Comments)    REACTION:  Severely drowsy    Current Outpatient Prescriptions  Medication Sig Dispense Refill  . aspirin EC 81 MG tablet Take by mouth. Take 81 mg by mouth daily.      . Calcium Carbonate-Vitamin D (CALCIUM 500 + D) 500-125 MG-UNIT TABS Take by mouth. Take 1 tablet by mouth every morning.      . chlorthalidone (HYGROTON) 25 MG tablet Take 1 tablet (25 mg total) by mouth 2 (two) times daily.  60 tablet  4  . Cholecalciferol (VITAMIN D-3 PO) Take 1 capsule by mouth every morning.       . clonazePAM (KLONOPIN) 2 MG tablet Take 1 tablet (2 mg total) by mouth 2 (two) times daily as needed for anxiety.  60 tablet  2  . cyclobenzaprine (FLEXERIL) 10 MG tablet Take 1 tablet (10 mg total) by mouth 3 (three) times daily as needed. For muscle spasms  90 tablet  2  . cycloSPORINE (RESTASIS) 0.05 % ophthalmic emulsion Place 1 drop into both eyes 2 (two) times daily. Apply to eye. Place 1 drop into both eyes 2 (two) times daily.  0.4 mL  2  . estradiol (ESTRACE) 0.1 MG/GM vaginal cream Place vaginally. Place 2 g vaginally daily.      [name] ezetimibe-simvastatin (VYTORIN) 10-40 MG per tablet Take 1 tablet by mouth at bedtime.  30 tablet  4  . levothyroxine (SYNTHROID, LEVOTHROID) 100 MCG tablet Take by mouth. Take 100 mcg by mouth daily.      [name] lubiprostone (AMITIZA) 24 MCG capsule Take 24 mcg by mouth 2 (two) times daily with a meal.       . magnesium oxide (MAG-OX 400) 400 MG tablet Take 1 tablet (400 mg total) by mouth daily.      . metoprolol succinate (TOPROL-XL)  50 MG 24 hr tablet Take 50 mg by mouth 2 (two) times daily. Take with or immediately following a meal.      . niacin (NIASPAN) 500 MG CR tablet Take 3 tablets (1,500 mg total) by mouth at bedtime.  90 tablet  2  . omega-3 acid ethyl esters (LOVAZA) 1 G capsule Take 1 g by mouth 2 (two) times daily.      [name] omeprazole (PRILOSEC) 20 MG capsule Take 1 capsule (20 mg total) by mouth 2 (two) times daily.  60 capsule  4  . potassium chloride SA (K-DUR,KLOR-CON) 20 MEQ tablet Take 1 tablet (20 mEq total) by mouth 3 (three) times daily.  90 tablet  1  . triamterene-hydrochlorothiazide (DYAZIDE) 37.5-25 MG per capsule Take 1 each (1 capsule total) by mouth every morning.  30 capsule  4  . trimethoprim (TRIMPEX) 100 MG tablet Take by mouth. Take 50 mg by mouth daily.      . Venlafaxine HCl 150 MG TB24 Take 1 tablet (150 mg total) by mouth every morning.  30 each  2   No current facility-administered medications for this visit.    Past Medical History  Diagnosis Date  . Borderline hypertension   . Obesity   . Hyperlipidemia   .  Hypothyroidism     [name]   . Fe deficiency anemia   . Cardiomegaly     NL LVF (EF 55-60%), diastolic dysfunction, no significant valvular abnormalities, 02/2010  . Hiatal hernia   . PVT (paroxysmal ventricular tachycardia) 8/05    rt, leg ortho Sx   . BBB (bundle branch block)     as per EKG   . Recurrent cystitis     Dr. Aldean Ast   . Chronic lower back pain   . Lumbar herniated disc     L4-L5   . History of mixed drug abuse   . Anxiety state, unspecified   . Acute cystitis   . Personal history of arthritis   . Other chronic cystitis   . Depressive disorder, not elsewhere classified   . Unspecified symptom associated with female genital organs   . Chest pain     Negative dobutamine stress echo, 06/2008; NL cardiac catheterization, 2004  . Heartburn   . Microscopic hematuria   . Personal history of venous thrombosis and embolism     Past Surgical History   Procedure Laterality Date  . Appendectomy  2004?or 11/03?  [name] Cardiac catheterization  2004    negative   . Partial hysterectomy  2004    + one ovary   . Right knee arthroscopy  8/05  . Right knee replacement  8/05  . Carpal tunnel release      x2   . Shoulder surgery    . Roctocele- lomax  05/2001  . Liver biopsy  2002    Dr. Katrinka Blazing   . Bladder repair    . Rectal prolapse repair    . Back surgery      History   Social History  . Marital Status: Single    Spouse Name: N/A    Number of Children: 5  . Years of Education: N/A   Occupational History  . disabled     Social History Main Topics  . Smoking status: Never Smoker   . Smokeless tobacco: Never Used  . Alcohol Use: No  . Drug Use: Not on file     Comment: history of abuse   . Sexually Active: Not on file   Other Topics Concern  . Not on file   Social History Narrative  . No narrative on file    Family History  Problem Relation Age of Onset  . Heart attack      family history   . Coronary artery disease      Family History   . Breast cancer Mother   . Breast cancer Mother     ROS: no nausea, vomiting; no fever, chills; no melena, hematochezia; no claudication  PHYSICAL EXAM: There were no vitals taken for this visit. GENERAL: 62 year old female, moderately obese; NAD  HEENT: NCAT, PERRLA, EOMI; sclera clear; no xanthelasma  NECK: palpable bilateral carotid pulses, no bruits; no JVD; no TM  LUNGS: CTA bilaterally  CARDIAC: RRR (S1, S2); no significant murmurs; no rubs or gallops  ABDOMEN: Soft, protuberant  EXTREMETIES: Trace bilateral peripheral edema  SKIN: warm/dry; no obvious rash/lesions  MUSCULOSKELETAL: no joint deformity  NEURO: no focal deficit; NL affect    EKG:   ASSESSMENT & PLAN:  No problem-specific assessment & plan notes found for this encounter.   Gene Shann Lewellyn, PAC

## 2012-12-11 NOTE — Telephone Encounter (Signed)
Called in.

## 2012-12-11 NOTE — Telephone Encounter (Signed)
LAST RF 11/11/12. LAST OV 4/14. CALL IN DRUG STORE IF APPROVED. LAST K+ 3.9 0N 10/06/12.

## 2012-12-11 NOTE — Assessment & Plan Note (Signed)
Quiescent on beta blocker therapy. No further workup currently indicated. Event monitor prior to previous OV in December 2013 was negative for recurrent SVT, although there was evidence of transient EAT at 150 bpm, but no definite atrial fibrillation/flutter.

## 2012-12-11 NOTE — Telephone Encounter (Signed)
This encounter was created in error - please disregard.

## 2012-12-11 NOTE — Telephone Encounter (Signed)
Please call in clonazepam rx with 2 refills

## 2012-12-11 NOTE — Patient Instructions (Signed)
   Echo Office will contact with results via phone or letter.   Continue all current medications. Your physician wants you to follow up in:  1 year.  You will receive a reminder letter in the mail one-two months in advance.  If you don't receive a letter, please call our office to schedule the follow up appointment

## 2012-12-11 NOTE — Assessment & Plan Note (Signed)
Will reassess with a complete echocardiogram. She has history of NL coronary arteries, by catheterization in 2004, and NL LVF. Most recent ischemic evaluation was a negative dobutamine stress echocardiogram, 06/2008. She does not smoke and has no history of DM. If her echocardiogram is unchanged, then no further workup is indicated and she can resume regular followup with Dr. Diona Browner in one year.

## 2012-12-18 ENCOUNTER — Other Ambulatory Visit: Payer: Medicaid Other

## 2012-12-19 ENCOUNTER — Other Ambulatory Visit: Payer: Self-pay

## 2012-12-19 ENCOUNTER — Other Ambulatory Visit (INDEPENDENT_AMBULATORY_CARE_PROVIDER_SITE_OTHER): Payer: Medicaid Other

## 2012-12-19 DIAGNOSIS — R0602 Shortness of breath: Secondary | ICD-10-CM

## 2012-12-20 ENCOUNTER — Telehealth: Payer: Self-pay | Admitting: *Deleted

## 2012-12-20 NOTE — Telephone Encounter (Signed)
Patient informed. 

## 2012-12-20 NOTE — Telephone Encounter (Signed)
Message copied by Eustace Moore on Fri Dec 20, 2012 11:10 AM ------      Message from: MCDOWELL, Illene Bolus      Created: Fri Dec 20, 2012  7:56 AM       LVEF remains normal. Diastolic parameters are also normal. No major abnormalities to explain her shortness of breath. ------

## 2013-01-02 ENCOUNTER — Ambulatory Visit (INDEPENDENT_AMBULATORY_CARE_PROVIDER_SITE_OTHER): Payer: Medicaid Other | Admitting: Family Medicine

## 2013-01-02 ENCOUNTER — Encounter: Payer: Self-pay | Admitting: Family Medicine

## 2013-01-02 VITALS — BP 99/56 | HR 86 | Temp 97.2°F | Wt 212.4 lb

## 2013-01-02 DIAGNOSIS — M5430 Sciatica, unspecified side: Secondary | ICD-10-CM

## 2013-01-02 DIAGNOSIS — E559 Vitamin D deficiency, unspecified: Secondary | ICD-10-CM | POA: Insufficient documentation

## 2013-01-02 DIAGNOSIS — N390 Urinary tract infection, site not specified: Secondary | ICD-10-CM

## 2013-01-02 DIAGNOSIS — E785 Hyperlipidemia, unspecified: Secondary | ICD-10-CM

## 2013-01-02 DIAGNOSIS — R350 Frequency of micturition: Secondary | ICD-10-CM

## 2013-01-02 DIAGNOSIS — IMO0001 Reserved for inherently not codable concepts without codable children: Secondary | ICD-10-CM | POA: Insufficient documentation

## 2013-01-02 DIAGNOSIS — I10 Essential (primary) hypertension: Secondary | ICD-10-CM

## 2013-01-02 DIAGNOSIS — M5431 Sciatica, right side: Secondary | ICD-10-CM

## 2013-01-02 LAB — POCT URINALYSIS DIPSTICK
Glucose, UA: NEGATIVE
Ketones, UA: 5
Nitrite, UA: NEGATIVE
Spec Grav, UA: 1.015
Urobilinogen, UA: 0.2
pH, UA: 6

## 2013-01-02 LAB — POCT UA - MICROSCOPIC ONLY
Casts, Ur, LPF, POC: NEGATIVE
Crystals, Ur, HPF, POC: NEGATIVE
Yeast, UA: NEGATIVE

## 2013-01-02 MED ORDER — METHYLPREDNISOLONE ACETATE 80 MG/ML IJ SUSP
80.0000 mg | Freq: Once | INTRAMUSCULAR | Status: AC
  Administered 2013-01-02: 80 mg via INTRAMUSCULAR

## 2013-01-02 MED ORDER — MELOXICAM 15 MG PO TABS: 15.0000 mg | ORAL_TABLET | Freq: Every day | ORAL | Status: DC

## 2013-01-02 MED ORDER — CIPROFLOXACIN HCL 250 MG PO TABS: 250.0000 mg | ORAL_TABLET | Freq: Two times a day (BID) | ORAL | Status: DC

## 2013-01-02 NOTE — Progress Notes (Signed)
Patient ID: [name], female   DOB: March 11, 1951, 62 y.o.   MRN: [id number] SUBJECTIVE: CC: Chief Complaint  Patient presents with  . Follow-up    3 month follow up  refilll meds   . Acute Visit    c/o right hip  with pain down leg x 1week   HPI: Right sciatica nerve pain. Deep in the right buttocks and runs down the back of the right thigh. No rashes, no weakness. No back pain. Occurred acutely. Has had this before and had to get a cortisone  Shot. Also, urine burning badly. Thinks she has a UTI as well. No vaginal discharge.  Past Medical History  Diagnosis Date  . Borderline hypertension   . Obesity   . Hyperlipidemia   . Hypothyroidism     [name]   . Fe deficiency anemia   . Cardiomegaly     NL LVF (EF 55-60%), diastolic dysfunction, no significant valvular abnormalities, 02/2010  . Hiatal hernia   . PVT (paroxysmal ventricular tachycardia) 8/05    rt, leg ortho Sx   . BBB (bundle branch block)     as per EKG   . Recurrent cystitis     Dr. Aldean Ast   . Chronic lower back pain   . Lumbar herniated disc     L4-L5   . History of mixed drug abuse   . Anxiety state, unspecified   . Acute cystitis   . Personal history of arthritis   . Other chronic cystitis   . Depressive disorder, not elsewhere classified   . Unspecified symptom associated with female genital organs   . Chest pain     Negative dobutamine stress echo, 06/2008; NL cardiac catheterization, 2004  . Heartburn   . Microscopic hematuria   . Personal history of venous thrombosis and embolism    Past Surgical History  Procedure Laterality Date  . Appendectomy  2004?or 11/03?  [name] Cardiac catheterization  2004    negative   . Partial hysterectomy  2004    + one ovary   . Right knee arthroscopy  8/05  . Right knee replacement  8/05  . Carpal tunnel release      x2   . Shoulder surgery    . Roctocele- lomax  05/2001  . Liver biopsy  2002    Dr. Katrinka Blazing   . Bladder repair    . Rectal prolapse repair     . Back surgery     History   Social History  . Marital Status: Single    Spouse Name: N/A    Number of Children: 5  . Years of Education: N/A   Occupational History  . disabled     Social History Main Topics  . Smoking status: Never Smoker   . Smokeless tobacco: Never Used  . Alcohol Use: No  . Drug Use: Not on file     Comment: history of abuse   . Sexually Active: Not on file   Other Topics Concern  . Not on file   Social History Narrative  . No narrative on file   Family History  Problem Relation Age of Onset  . Heart attack      family history   . Coronary artery disease      Family History   . Breast cancer Mother   . Breast cancer Mother    Current Outpatient Prescriptions on File Prior to Visit  Medication Sig Dispense Refill  . aspirin EC 81 MG tablet Take  by mouth. Take 81 mg by mouth daily.      . Calcium Carbonate-Vitamin D (CALCIUM 500 + D) 500-125 MG-UNIT TABS Take by mouth. Take 1 tablet by mouth every morning.      . chlorthalidone (HYGROTON) 25 MG tablet Take 1 tablet (25 mg total) by mouth 2 (two) times daily.  60 tablet  4  . Cholecalciferol (VITAMIN D-3 PO) Take 1 capsule by mouth every morning.       . clonazePAM (KLONOPIN) 2 MG tablet Take 1 tablet (2 mg total) by mouth 2 (two) times daily as needed for anxiety.  60 tablet  2  . cyclobenzaprine (FLEXERIL) 10 MG tablet Take 1 tablet (10 mg total) by mouth 3 (three) times daily as needed. For muscle spasms  90 tablet  2  . cycloSPORINE (RESTASIS) 0.05 % ophthalmic emulsion Place 1 drop into both eyes 2 (two) times daily. Apply to eye. Place 1 drop into both eyes 2 (two) times daily.  0.4 mL  2  . estradiol (ESTRACE) 0.1 MG/GM vaginal cream Place vaginally. Place 2 g vaginally daily.      [name] ezetimibe-simvastatin (VYTORIN) 10-40 MG per tablet Take 1 tablet by mouth at bedtime.  30 tablet  4  . levothyroxine (SYNTHROID, LEVOTHROID) 50 MCG tablet Take 50 mcg by mouth daily before breakfast.      .  lubiprostone (AMITIZA) 24 MCG capsule Take 24 mcg by mouth 2 (two) times daily with a meal.       . Magnesium 250 MG TABS Take 500 mg by mouth daily.      . metoprolol succinate (TOPROL-XL) 50 MG 24 hr tablet Take 1 tablet (50 mg total) by mouth 2 (two) times daily. Take with or immediately following a meal.  60 tablet  11  . niacin (NIASPAN) 500 MG CR tablet Take 3 tablets (1,500 mg total) by mouth at bedtime.  90 tablet  2  . omega-3 acid ethyl esters (LOVAZA) 1 G capsule Take 1 g by mouth 2 (two) times daily.      [name] omeprazole (PRILOSEC) 20 MG capsule Take 1 capsule (20 mg total) by mouth 2 (two) times daily.  60 capsule  4  . phenazopyridine (PYRIDIUM) 200 MG tablet Take 200 mg by mouth every 6 (six) hours as needed for pain.      . potassium chloride SA (K-DUR,KLOR-CON) 20 MEQ tablet Take 1 tablet (20 mEq total) by mouth 3 (three) times daily.  90 tablet  1  . triamterene-hydrochlorothiazide (DYAZIDE) 37.5-25 MG per capsule Take 1 each (1 capsule total) by mouth every morning.  30 capsule  4  . trimethoprim (TRIMPEX) 100 MG tablet Take by mouth. Take 50 mg by mouth daily.      . Venlafaxine HCl 150 MG TB24 Take 1 tablet (150 mg total) by mouth every morning.  30 each  2   No current facility-administered medications on file prior to visit.   Allergies  Allergen Reactions  . Clindamycin     REACTION: caused C-diff per patient  . Diphenhydramine Hcl Other (See Comments)    REACTION:  Severely drowsy    There is no immunization history on file for this patient. Prior to Admission medications   Medication Sig Start Date End Date Taking? Authorizing Provider  aspirin EC 81 MG tablet Take by mouth. Take 81 mg by mouth daily.   Yes Historical Provider, MD  Calcium Carbonate-Vitamin D (CALCIUM 500 + D) 500-125 MG-UNIT TABS Take by mouth. Take 1  tablet by mouth every morning.   Yes Historical Provider, MD  chlorthalidone (HYGROTON) 25 MG tablet Take 1 tablet (25 mg total) by mouth 2 (two)  times daily. 11/11/12  Yes Ileana Ladd, MD  Cholecalciferol (VITAMIN D-3 PO) Take 1 capsule by mouth every morning.    Yes Historical Provider, MD  clonazePAM (KLONOPIN) 2 MG tablet Take 1 tablet (2 mg total) by mouth 2 (two) times daily as needed for anxiety. 12/11/12  Yes Mary-Margaret Daphine Deutscher, FNP  cyclobenzaprine (FLEXERIL) 10 MG tablet Take 1 tablet (10 mg total) by mouth 3 (three) times daily as needed. For muscle spasms 11/11/12  Yes Ileana Ladd, MD  cycloSPORINE (RESTASIS) 0.05 % ophthalmic emulsion Place 1 drop into both eyes 2 (two) times daily. Apply to eye. Place 1 drop into both eyes 2 (two) times daily. 11/11/12  Yes Ileana Ladd, MD  estradiol (ESTRACE) 0.1 MG/GM vaginal cream Place vaginally. Place 2 g vaginally daily.   Yes Historical Provider, MD  ezetimibe-simvastatin (VYTORIN) 10-40 MG per tablet Take 1 tablet by mouth at bedtime. 10/30/12  Yes Ileana Ladd, MD  levothyroxine (SYNTHROID, LEVOTHROID) 50 MCG tablet Take 50 mcg by mouth daily before breakfast.   Yes Historical Provider, MD  lubiprostone (AMITIZA) 24 MCG capsule Take 24 mcg by mouth 2 (two) times daily with a meal.    Yes Historical Provider, MD  Magnesium 250 MG TABS Take 500 mg by mouth daily.   Yes Historical Provider, MD  metoprolol succinate (TOPROL-XL) 50 MG 24 hr tablet Take 1 tablet (50 mg total) by mouth 2 (two) times daily. Take with or immediately following a meal. 12/11/12  Yes Prescott Parma, PA-C  niacin (NIASPAN) 500 MG CR tablet Take 3 tablets (1,500 mg total) by mouth at bedtime. 12/11/12  Yes Ileana Ladd, MD  omega-3 acid ethyl esters (LOVAZA) 1 G capsule Take 1 g by mouth 2 (two) times daily.   Yes Historical Provider, MD  omeprazole (PRILOSEC) 20 MG capsule Take 1 capsule (20 mg total) by mouth 2 (two) times daily. 11/11/12  Yes Ileana Ladd, MD  phenazopyridine (PYRIDIUM) 200 MG tablet Take 200 mg by mouth every 6 (six) hours as needed for pain.   Yes Historical Provider, MD  potassium chloride SA  (K-DUR,KLOR-CON) 20 MEQ tablet Take 1 tablet (20 mEq total) by mouth 3 (three) times daily. 12/11/12  Yes Mary-Margaret Daphine Deutscher, FNP  triamterene-hydrochlorothiazide (DYAZIDE) 37.5-25 MG per capsule Take 1 each (1 capsule total) by mouth every morning. 11/11/12  Yes Ileana Ladd, MD  trimethoprim (TRIMPEX) 100 MG tablet Take by mouth. Take 50 mg by mouth daily.   Yes Historical Provider, MD  Venlafaxine HCl 150 MG TB24 Take 1 tablet (150 mg total) by mouth every morning. 11/11/12  Yes Ileana Ladd, MD  ciprofloxacin (CIPRO) 250 MG tablet Take 1 tablet (250 mg total) by mouth 2 (two) times daily. 01/02/13   Ileana Ladd, MD  meloxicam (MOBIC) 15 MG tablet Take 1 tablet (15 mg total) by mouth daily. 01/02/13   Ileana Ladd, MD     ROS: As above in the HPI. All other systems are stable or negative.  OBJECTIVE: APPEARANCE:  Patient in no acute distress.The patient appeared well nourished and normally developed. Acyanotic. Waist: VITAL SIGNS:BP 99/56  Pulse 86  Temp(Src) 97.2 F (36.2 C) (Oral)  Wt 212 lb 6.4 oz (96.344 kg)  BMI 35.35 kg/m2  Obese WF in a wheelchair.  SKIN: warm and  Dry without overt rashes, tattoos and scars  HEAD and Neck: without JVD, Head and scalp: normal Eyes:No scleral icterus. Fundi normal, eye movements normal. Ears: Auricle normal, canal normal, Tympanic membranes normal, insufflation normal. Nose: normal Throat: normal Neck & thyroid: normal  CHEST & LUNGS: Chest wall: normal Lungs: Clear  CVS: Reveals the PMI to be normally located. Regular rhythm, First and Second Heart sounds are normal,  absence of murmurs, rubs or gallops. Peripheral vasculature: Radial pulses: normal Dorsal pedis pulses: normal Posterior pulses: normal  ABDOMEN:  Appearance: normal Benign, no organomegaly, no masses, no Abdominal Aortic enlargement. No Guarding , no rebound. No Bruits. Bowel sounds: normal  RECTAL: N/A GU: N/A  EXTREMETIES: nonedematous. Both  Femoral and Pedal pulses are normal.  MUSCULOSKELETAL:  Spine: normal Right sciatic notch was very positive for pain.trigger positive.  NEUROLOGIC: oriented to time,place and person; nonfocal. Strength is normal Sensory is normal Reflexes are normal Cranial Nerves are normal.   Results for orders placed in visit on 01/02/13  POCT URINALYSIS DIPSTICK      Result Value Range   Color, UA amber     Clarity, UA cloudy     Glucose, UA neg     Bilirubin, UA large     Ketones, UA 5     Spec Grav, UA 1.015     Blood, UA trace     pH, UA 6.0     Protein, UA 30+     Urobilinogen, UA 0.2     Nitrite, UA neg     Leukocytes, UA large (3+)    POCT UA - MICROSCOPIC ONLY      Result Value Range   WBC, Ur, HPF, POC 15-20     RBC, urine, microscopic 5-10     Bacteria, U Microscopic occ     Mucus, UA mod     Epithelial cells, urine per micros occ     Crystals, Ur, HPF, POC neg     Casts, Ur, LPF, POC neg     Yeast, UA neg      ASSESSMENT: Sciatica neuralgia, right - Plan: meloxicam (MOBIC) 15 MG tablet, methylPREDNISolone acetate (DEPO-MEDROL) injection 80 mg  Frequency - Plan: POCT urinalysis dipstick, POCT UA - Microscopic Only  HYPERTENSION, UNSPECIFIED - Plan: COMPLETE METABOLIC PANEL WITH GFR  HYPERLIPIDEMIA-MIXED - Plan: COMPLETE METABOLIC PANEL WITH GFR, NMR Lipoprofile with Lipids  Unspecified vitamin D deficiency - Plan: Vitamin D 25 hydroxy  Infection of urinary tract - Plan: Urine culture, ciprofloxacin (CIPRO) 250 MG tablet  PLAN: Orders Placed This Encounter  Procedures  . Urine culture  . COMPLETE METABOLIC PANEL WITH GFR  . NMR Lipoprofile with Lipids  . Vitamin D 25 hydroxy  . POCT urinalysis dipstick  . POCT UA - Microscopic Only   Meds ordered this encounter  Medications  . ciprofloxacin (CIPRO) 250 MG tablet    Sig: Take 1 tablet (250 mg total) by mouth 2 (two) times daily.    Dispense:  10 tablet    Refill:  0  . meloxicam (MOBIC) 15 MG tablet     Sig: Take 1 tablet (15 mg total) by mouth daily.    Dispense:  30 tablet    Refill:  0  . methylPREDNISolone acetate (DEPO-MEDROL) injection 80 mg    Sig:     Return in about 4 weeks (around 01/30/2013) for Recheck urinary tract infection.  Vinay Ertl P. Modesto Charon, [name]

## 2013-01-02 NOTE — Patient Instructions (Addendum)
Methylprednisolone Suspension for Injection What is this medicine? METHYLPREDNISOLONE (meth ill pred NISS oh lone) is a corticosteroid. It is commonly used to treat inflammation of the skin, joints, lungs, and other organs. Common conditions treated include asthma, allergies, and arthritis. It is also used for other conditions, such as blood disorders and diseases of the adrenal glands. This medicine may be used for other purposes; ask your health care provider or pharmacist if you have questions. What should I tell my health care provider before I take this medicine? They need to know if you have any of these conditions: -cataracts or glaucoma -Cushings -heart disease -high blood pressure -infection including tuberculosis -low calcium or potassium levels in the blood -recent surgery -seizures -stomach or intestinal disease, including colitis -threadworms -thyroid problems -an unusual or allergic reaction to methylprednisolone, corticosteroids, benzyl alcohol, other medicines, foods, dyes, or preservatives -pregnant or trying to get pregnant -breast-feeding How should I use this medicine? This medicine is for injection into a muscle, joint, or other tissue. It is given by a health care professional in a hospital or clinic setting. Talk to your pediatrician regarding the use of this medicine in children. While this drug may be prescribed for selected conditions, precautions do apply. Overdosage: If you think you have taken too much of this medicine contact a poison control center or emergency room at once. NOTE: This medicine is only for you. Do not share this medicine with others. What if I miss a dose? This does not apply. What may interact with this medicine? Do not take this medicine with any of the following medications: -mifepristone -radiopaque contrast agents This medicine may also interact with the following medications: -aspirin and aspirin-like  medicines -cyclosporin -ketoconazole -phenobarbital -phenytoin -rifampin -tacrolimus -troleandomycin -vaccines -warfarin This list may not describe all possible interactions. Give your health care provider a list of all the medicines, herbs, non-prescription drugs, or dietary supplements you use. Also tell them if you smoke, drink alcohol, or use illegal drugs. Some items may interact with your medicine. What should I watch for while using this medicine? Visit your doctor or health care professional for regular checks on your progress. If you are taking this medicine for a long time, carry an identification card with your name and address, the type and dose of your medicine, and your doctor's name and address. The medicine may increase your risk of getting an infection. Stay away from people who are sick. Tell your doctor or health care professional if you are around anyone with measles or chickenpox. You may need to avoid some vaccines. Talk to your health care provider for more information. If you are going to have surgery, tell your doctor or health care professional that you have taken this medicine within the last twelve months. Ask your doctor or health care professional about your diet. You may need to lower the amount of salt you eat. The medicine can increase your blood sugar. If you are a diabetic check with your doctor if you need help adjusting the dose of your diabetic medicine. What side effects may I notice from receiving this medicine? Side effects that you should report to your doctor or health care professional as soon as possible: -allergic reactions like skin rash, itching or hives, swelling of the face, lips, or tongue -bloody or tarry stools -changes in vision -eye pain or bulging eyes -fever, sore throat, sneezing, cough, or other signs of infection, wounds that will not heal -increased thirst -irregular heartbeat -muscle cramps -pain   in hips, back, ribs, arms,  shoulders, or legs -swelling of the ankles, feet, hands -trouble passing urine or change in the amount of urine -unusual bleeding or bruising -unusually weak or tired -weight gain or weight loss Side effects that usually do not require medical attention (report to your doctor or health care professional if they continue or are bothersome): -changes in emotions or moods -constipation or diarrhea -headache -irritation at site where injected -nausea, vomiting -skin problems, acne, thin and shiny skin -trouble sleeping -unusual hair growth on the face or body This list may not describe all possible side effects. Call your doctor for medical advice about side effects. You may report side effects to FDA at 1-800-FDA-1088. Where should I keep my medicine? This drug is given in a hospital or clinic and will not be stored at home. NOTE: This sheet is a summary. It may not cover all possible information. If you have questions about this medicine, talk to your doctor, pharmacist, or health care provider.  2013, [employer]. (12/18/2007 2:36:31 PM)  

## 2013-01-03 LAB — COMPLETE METABOLIC PANEL WITH GFR
ALT: 43 U/L — ABNORMAL HIGH (ref 0–35)
AST: 37 U/L (ref 0–37)
Albumin: 3.9 g/dL (ref 3.5–5.2)
Alkaline Phosphatase: 120 U/L — ABNORMAL HIGH (ref 39–117)
BUN: 18 mg/dL (ref 6–23)
CO2: 27 mEq/L (ref 19–32)
Calcium: 10 mg/dL (ref 8.4–10.5)
Chloride: 94 mEq/L — ABNORMAL LOW (ref 96–112)
Creat: 0.99 mg/dL (ref 0.50–1.10)
GFR, Est African American: 71 mL/min
GFR, Est Non African American: 62 mL/min
Glucose, Bld: 101 mg/dL — ABNORMAL HIGH (ref 70–99)
Potassium: 3.2 mEq/L — ABNORMAL LOW (ref 3.5–5.3)
Sodium: 136 mEq/L (ref 135–145)
Total Bilirubin: 0.7 mg/dL (ref 0.3–1.2)
Total Protein: 7.3 g/dL (ref 6.0–8.3)

## 2013-01-03 LAB — VITAMIN D 25 HYDROXY (VIT D DEFICIENCY, FRACTURES): Vit D, 25-Hydroxy: 39 ng/mL (ref 30–89)

## 2013-01-04 LAB — URINE CULTURE: Colony Count: >=100000

## 2013-01-06 ENCOUNTER — Other Ambulatory Visit: Payer: Self-pay | Admitting: Family Medicine

## 2013-01-06 LAB — NMR LIPOPROFILE WITH LIPIDS
Cholesterol, Total: 166 mg/dL (ref <200)
HDL Particle Number: 43 umol/L (ref >=30.5)
HDL Size: 10.6 nm (ref >=9.2)
HDL-C: 75 mg/dL (ref >=40)
LDL (calc): 70 mg/dL (ref <100)
LDL Particle Number: <300 nmol/L (ref <1000)
LP-IR Score: <25 (ref <=45)
Large HDL-P: 17.8 umol/L (ref >=4.8)
Large VLDL-P: <0.8 nmol/L (ref <=2.7)
Small LDL Particle Number: <90 nmol/L (ref <=527)
Triglycerides: 103 mg/dL (ref <150)
VLDL Size: 44.1 nm (ref <=46.6)

## 2013-01-06 MED ORDER — CEPHALEXIN 500 MG PO CAPS: 500.0000 mg | ORAL_CAPSULE | Freq: Two times a day (BID) | ORAL | Status: DC

## 2013-01-06 NOTE — Progress Notes (Signed)
Quick Note:  Labs abnormal.  Potassium is low. The urine culture shows Strep. The cipro won't work as well. Need to change to Cephalexin 500 mg twice A day for 7 days. Need to stop one of her diuretic: the [place] or chlorthalidone(generic) increase the potassium for 2 days to 2 tabs twice D ay. RTC in 1 week to recheck the potassium and urine. Ordered antibiotic in EPIC ______

## 2013-01-13 ENCOUNTER — Encounter: Payer: Self-pay | Admitting: *Deleted

## 2013-01-16 ENCOUNTER — Other Ambulatory Visit (INDEPENDENT_AMBULATORY_CARE_PROVIDER_SITE_OTHER): Payer: Medicaid Other

## 2013-01-16 DIAGNOSIS — R5383 Other fatigue: Secondary | ICD-10-CM

## 2013-01-16 DIAGNOSIS — R5381 Other malaise: Secondary | ICD-10-CM

## 2013-01-16 DIAGNOSIS — N390 Urinary tract infection, site not specified: Secondary | ICD-10-CM

## 2013-01-16 LAB — POCT UA - MICROSCOPIC ONLY
Bacteria, U Microscopic: NEGATIVE
Casts, Ur, LPF, POC: NEGATIVE
Crystals, Ur, HPF, POC: NEGATIVE
Yeast, UA: NEGATIVE

## 2013-01-16 LAB — POCT URINALYSIS DIPSTICK
Bilirubin, UA: NEGATIVE
Glucose, UA: NEGATIVE
Ketones, UA: NEGATIVE
Nitrite, UA: NEGATIVE
Protein, UA: NEGATIVE
Spec Grav, UA: 1.015
Urobilinogen, UA: NEGATIVE
pH, UA: 7.5

## 2013-01-17 LAB — BMP8+EGFR
BUN/Creatinine Ratio: 26 (ref 11–26)
BUN: 20 mg/dL (ref 8–27)
CO2: 25 mmol/L (ref 18–29)
Calcium: 9.7 mg/dL (ref 8.6–10.2)
Chloride: 103 mmol/L (ref 97–108)
Creatinine, Ser: 0.78 mg/dL (ref 0.57–1.00)
GFR calc Af Amer: 95 mL/min/{1.73_m2} (ref >59)
GFR calc non Af Amer: 82 mL/min/{1.73_m2} (ref >59)
Glucose: 87 mg/dL (ref 65–99)
Potassium: 3.5 mmol/L (ref 3.5–5.2)
Sodium: 141 mmol/L (ref 134–144)

## 2013-01-17 NOTE — Progress Notes (Signed)
Quick Note:  Call patient. Labs normal.UTI cleared up.no bacteria Seen.  No change in plan. ______

## 2013-01-29 ENCOUNTER — Other Ambulatory Visit: Payer: Self-pay | Admitting: *Deleted

## 2013-01-29 MED ORDER — EZETIMIBE-SIMVASTATIN 10-40 MG PO TABS: 1.0000 | ORAL_TABLET | Freq: Every day | ORAL | Status: DC

## 2013-01-30 ENCOUNTER — Ambulatory Visit: Payer: Medicaid Other | Admitting: Family Medicine

## 2013-02-12 ENCOUNTER — Other Ambulatory Visit: Payer: Self-pay | Admitting: *Deleted

## 2013-02-12 MED ORDER — LEVOTHYROXINE SODIUM 50 MCG PO TABS: 50.0000 ug | ORAL_TABLET | Freq: Every day | ORAL | Status: DC

## 2013-02-13 ENCOUNTER — Other Ambulatory Visit: Payer: Self-pay

## 2013-02-13 NOTE — Telephone Encounter (Signed)
Last seen 01/02/13  [facility]   If approved Clonopin  Route to nurse and call in

## 2013-02-14 MED ORDER — POTASSIUM CHLORIDE CRYS ER 20 MEQ PO TBCR: 20.0000 meq | EXTENDED_RELEASE_TABLET | Freq: Three times a day (TID) | ORAL | Status: DC

## 2013-02-14 MED ORDER — VENLAFAXINE HCL ER 150 MG PO TB24: 1.0000 | ORAL_TABLET | Freq: Every morning | ORAL | Status: DC

## 2013-02-14 MED ORDER — LUBIPROSTONE 24 MCG PO CAPS: 24.0000 ug | ORAL_CAPSULE | Freq: Two times a day (BID) | ORAL | Status: DC

## 2013-02-14 MED ORDER — CYCLOSPORINE 0.05 % OP EMUL: 1.0000 [drp] | Freq: Two times a day (BID) | OPHTHALMIC | Status: DC

## 2013-02-14 MED ORDER — CLONAZEPAM 2 MG PO TABS: 2.0000 mg | ORAL_TABLET | Freq: Two times a day (BID) | ORAL | Status: DC | PRN

## 2013-02-24 ENCOUNTER — Encounter: Payer: Self-pay | Admitting: *Deleted

## 2013-03-05 ENCOUNTER — Telehealth: Payer: Self-pay | Admitting: Family Medicine

## 2013-03-06 NOTE — Telephone Encounter (Signed)
Noted in pt immunization report

## 2013-03-10 ENCOUNTER — Telehealth: Payer: Self-pay | Admitting: Family Medicine

## 2013-03-11 ENCOUNTER — Other Ambulatory Visit: Payer: Self-pay

## 2013-03-11 MED ORDER — CLONAZEPAM 2 MG PO TABS: 2.0000 mg | ORAL_TABLET | Freq: Two times a day (BID) | ORAL | Status: DC | PRN

## 2013-03-11 MED ORDER — NIACIN ER (ANTIHYPERLIPIDEMIC) 500 MG PO TBCR: 1500.0000 mg | EXTENDED_RELEASE_TABLET | Freq: Every day | ORAL | Status: DC

## 2013-03-11 NOTE — Telephone Encounter (Signed)
Rx ready for Phone in. 

## 2013-03-11 NOTE — Telephone Encounter (Signed)
Last seen 01/02/13  [facility]  Route wrong on 02/14/13 and never got called in  If approved route to [facility]

## 2013-03-11 NOTE — Telephone Encounter (Signed)
Done 02/14/13

## 2013-03-13 NOTE — Telephone Encounter (Signed)
[facility] notified of refill for [employer]

## 2013-03-20 ENCOUNTER — Other Ambulatory Visit: Payer: Self-pay

## 2013-03-20 DIAGNOSIS — Z1231 Encounter for screening mammogram for malignant neoplasm of breast: Secondary | ICD-10-CM

## 2013-04-04 ENCOUNTER — Ambulatory Visit: Payer: Medicaid Other | Admitting: Family Medicine

## 2013-04-09 ENCOUNTER — Ambulatory Visit: Payer: Medicaid Other | Admitting: Family Medicine

## 2013-04-10 ENCOUNTER — Other Ambulatory Visit: Payer: Self-pay

## 2013-04-10 NOTE — Telephone Encounter (Signed)
Last seen 01/02/13  [facility] 

## 2013-04-11 ENCOUNTER — Other Ambulatory Visit: Payer: Self-pay | Admitting: Family Medicine

## 2013-04-11 ENCOUNTER — Ambulatory Visit (INDEPENDENT_AMBULATORY_CARE_PROVIDER_SITE_OTHER): Payer: Medicaid Other | Admitting: Family Medicine

## 2013-04-11 ENCOUNTER — Encounter: Payer: Self-pay | Admitting: Family Medicine

## 2013-04-11 VITALS — BP 137/76 | HR 75 | Temp 97.1°F | Ht 63.5 in | Wt 216.6 lb

## 2013-04-11 DIAGNOSIS — R350 Frequency of micturition: Secondary | ICD-10-CM

## 2013-04-11 DIAGNOSIS — I10 Essential (primary) hypertension: Secondary | ICD-10-CM

## 2013-04-11 DIAGNOSIS — I471 Supraventricular tachycardia, unspecified: Secondary | ICD-10-CM

## 2013-04-11 DIAGNOSIS — IMO0001 Reserved for inherently not codable concepts without codable children: Secondary | ICD-10-CM

## 2013-04-11 DIAGNOSIS — F4321 Adjustment disorder with depressed mood: Secondary | ICD-10-CM | POA: Insufficient documentation

## 2013-04-11 DIAGNOSIS — I498 Other specified cardiac arrhythmias: Secondary | ICD-10-CM

## 2013-04-11 DIAGNOSIS — E785 Hyperlipidemia, unspecified: Secondary | ICD-10-CM

## 2013-04-11 DIAGNOSIS — R197 Diarrhea, unspecified: Secondary | ICD-10-CM | POA: Insufficient documentation

## 2013-04-11 LAB — POCT URINALYSIS DIPSTICK
Bilirubin, UA: NEGATIVE
Blood, UA: NEGATIVE
Glucose, UA: NEGATIVE
Nitrite, UA: NEGATIVE
Urobilinogen, UA: NEGATIVE

## 2013-04-11 LAB — POCT UA - MICROSCOPIC ONLY: Crystals, Ur, HPF, POC: NEGATIVE

## 2013-04-11 MED ORDER — TRIAMTERENE-HCTZ 37.5-25 MG PO CAPS: 1.0000 | ORAL_CAPSULE | ORAL | Status: DC

## 2013-04-11 MED ORDER — OMEPRAZOLE 20 MG PO CPDR: 20.0000 mg | DELAYED_RELEASE_CAPSULE | Freq: Two times a day (BID) | ORAL | Status: DC

## 2013-04-11 MED ORDER — CYCLOBENZAPRINE HCL 10 MG PO TABS: 10.0000 mg | ORAL_TABLET | Freq: Three times a day (TID) | ORAL | Status: DC | PRN

## 2013-04-11 NOTE — Progress Notes (Addendum)
Patient ID: [name], female   DOB: [birth year], 62 y.o.   MRN: [id number] SUBJECTIVE: CC: Chief Complaint  Patient presents with  . Follow-up    3 month follow up states having fecal incont.     HPI: Has been eating a lot of grapes. Also, the cardiologist has told her to take magnesium tablets but she is unsure why. She has had loose stools and went to urinate to give a sample and she had a squirt of diarrhea loose stools and soiled her underwear. She had a normal colonoscope last year.  She is here for follow up of her medical problems  Patient is here for follow up of hyperlipidemia/hypertension: denies Headache;denies Chest Pain;denies weakness;denies Shortness of Breath and orthopnea;denies Visual changes;denies palpitations;denies cough;denies pedal edema;denies symptoms of TIA or stroke;deniesClaudication symptoms. admits to Compliance with medications; denies Problems with medications.   Past Medical History  Diagnosis Date  . Borderline hypertension   . Obesity   . Hyperlipidemia   . Hypothyroidism     [name]   . Fe deficiency anemia   . Cardiomegaly     NL LVF (EF 55-60%), diastolic dysfunction, no significant valvular abnormalities, 02/2010  . Hiatal hernia   . PVT (paroxysmal ventricular tachycardia) 8/05    rt, leg ortho Sx   . BBB (bundle branch block)     as per EKG   . Recurrent cystitis     Dr. Aldean Ast   . Chronic lower back pain   . Lumbar herniated disc     L4-L5   . History of mixed drug abuse   . Anxiety state, unspecified   . Acute cystitis   . Personal history of arthritis   . Other chronic cystitis   . Depressive disorder, not elsewhere classified   . Unspecified symptom associated with female genital organs   . Chest pain     Negative dobutamine stress echo, 06/2008; NL cardiac catheterization, 2004  . Heartburn   . Microscopic hematuria   . Personal history of venous thrombosis and embolism    Past Surgical History  Procedure  Laterality Date  . Appendectomy  2004?or 11/03?  [name] Cardiac catheterization  2004    negative   . Partial hysterectomy  2004    + one ovary   . Right knee arthroscopy  8/05  . Right knee replacement  8/05  . Carpal tunnel release      x2   . Shoulder surgery    . Roctocele- lomax  05/2001  . Liver biopsy  2002    Dr. Katrinka Blazing   . Bladder repair    . Rectal prolapse repair    . Back surgery     History   Social History  . Marital Status: Single    Spouse Name: N/A    Number of Children: 5  . Years of Education: N/A   Occupational History  . disabled     Social History Main Topics  . Smoking status: Never Smoker   . Smokeless tobacco: Never Used  . Alcohol Use: No  . Drug Use: Not on file     Comment: history of abuse   . Sexual Activity: Not on file   Other Topics Concern  . Not on file   Social History Narrative  . No narrative on file   Family History  Problem Relation Age of Onset  . Heart attack      family history   . Coronary artery disease  Family History   . Breast cancer Mother   . Breast cancer Mother    Current Outpatient Prescriptions on File Prior to Visit  Medication Sig Dispense Refill  . aspirin EC 81 MG tablet Take by mouth. Take 81 mg by mouth daily.      . Calcium Carbonate-Vitamin D (CALCIUM 500 + D) 500-125 MG-UNIT TABS Take by mouth. Take 1 tablet by mouth every morning.      . Cholecalciferol (VITAMIN D-3 PO) Take 1 capsule by mouth every morning.       . clonazePAM (KLONOPIN) 2 MG tablet Take 1 tablet (2 mg total) by mouth 2 (two) times daily as needed for anxiety.  60 tablet  2  . cyclobenzaprine (FLEXERIL) 10 MG tablet Take 1 tablet (10 mg total) by mouth 3 (three) times daily as needed. For muscle spasms  90 tablet  2  . cycloSPORINE (RESTASIS) 0.05 % ophthalmic emulsion Place 1 drop into both eyes 2 (two) times daily. Apply to eye. Place 1 drop into both eyes 2 (two) times daily.  0.4 mL  2  . estradiol (ESTRACE) 0.1 MG/GM vaginal  cream Place vaginally. Place 2 g vaginally daily.      [name] ezetimibe-simvastatin (VYTORIN) 10-40 MG per tablet Take 1 tablet by mouth at bedtime.  30 tablet  4  . levothyroxine (SYNTHROID, LEVOTHROID) 50 MCG tablet Take 1 tablet (50 mcg total) by mouth daily before breakfast.  30 tablet  6  . lubiprostone (AMITIZA) 24 MCG capsule Take 1 capsule (24 mcg total) by mouth 2 (two) times daily with a meal.  60 capsule  2  . Magnesium 250 MG TABS Take 500 mg by mouth daily.      . meloxicam (MOBIC) 15 MG tablet Take 1 tablet (15 mg total) by mouth daily.  30 tablet  0  . metoprolol succinate (TOPROL-XL) 50 MG 24 hr tablet Take 1 tablet (50 mg total) by mouth 2 (two) times daily. Take with or immediately following a meal.  60 tablet  11  . niacin (NIASPAN) 500 MG CR tablet Take 3 tablets (1,500 mg total) by mouth at bedtime.  90 tablet  3  . omega-3 acid ethyl esters (LOVAZA) 1 G capsule Take 1 g by mouth 2 (two) times daily.      [name] omeprazole (PRILOSEC) 20 MG capsule Take 1 capsule (20 mg total) by mouth 2 (two) times daily.  60 capsule  2  . potassium chloride SA (K-DUR,KLOR-CON) 20 MEQ tablet Take 1 tablet (20 mEq total) by mouth 3 (three) times daily.  90 tablet  2  . triamterene-hydrochlorothiazide (DYAZIDE) 37.5-25 MG per capsule Take 1 each (1 capsule total) by mouth every morning.  30 capsule  2  . Venlafaxine HCl 150 MG TB24 Take 1 tablet (150 mg total) by mouth every morning.  30 each  2   No current facility-administered medications on file prior to visit.   Allergies  Allergen Reactions  . Clindamycin     REACTION: caused C-diff per patient  . Diphenhydramine Hcl Other (See Comments)    REACTION:  Severely drowsy   Immunization History  Administered Date(s) Administered  . Influenza-Generic 03/06/2013   Prior to Admission medications   Medication Sig Start Date End Date Taking? Authorizing Provider  aspirin EC 81 MG tablet Take by mouth. Take 81 mg by mouth daily.   Yes Historical  Provider, MD  Calcium Carbonate-Vitamin D (CALCIUM 500 + D) 500-125 MG-UNIT TABS Take by mouth. Take 1  tablet by mouth every morning.   Yes Historical Provider, MD  Cholecalciferol (VITAMIN D-3 PO) Take 1 capsule by mouth every morning.    Yes Historical Provider, MD  clonazePAM (KLONOPIN) 2 MG tablet Take 1 tablet (2 mg total) by mouth 2 (two) times daily as needed for anxiety. 03/11/13  Yes Ileana Ladd, MD  cyclobenzaprine (FLEXERIL) 10 MG tablet Take 1 tablet (10 mg total) by mouth 3 (three) times daily as needed. For muscle spasms 04/10/13  Yes Ileana Ladd, MD  cycloSPORINE (RESTASIS) 0.05 % ophthalmic emulsion Place 1 drop into both eyes 2 (two) times daily. Apply to eye. Place 1 drop into both eyes 2 (two) times daily. 02/13/13  Yes Ileana Ladd, MD  estradiol (ESTRACE) 0.1 MG/GM vaginal cream Place vaginally. Place 2 g vaginally daily.   Yes Historical Provider, MD  ezetimibe-simvastatin (VYTORIN) 10-40 MG per tablet Take 1 tablet by mouth at bedtime. 01/29/13  Yes Mary-Margaret Daphine Deutscher, FNP  levothyroxine (SYNTHROID, LEVOTHROID) 50 MCG tablet Take 1 tablet (50 mcg total) by mouth daily before breakfast. 02/12/13  Yes Mary-Margaret Daphine Deutscher, FNP  lubiprostone (AMITIZA) 24 MCG capsule Take 1 capsule (24 mcg total) by mouth 2 (two) times daily with a meal. 02/13/13  Yes Ileana Ladd, MD  Magnesium 250 MG TABS Take 500 mg by mouth daily.   Yes Historical Provider, MD  meloxicam (MOBIC) 15 MG tablet Take 1 tablet (15 mg total) by mouth daily. 01/02/13  Yes Ileana Ladd, MD  metoprolol succinate (TOPROL-XL) 50 MG 24 hr tablet Take 1 tablet (50 mg total) by mouth 2 (two) times daily. Take with or immediately following a meal. 12/11/12  Yes Rande Brunt, PA-C  niacin (NIASPAN) 500 MG CR tablet Take 3 tablets (1,500 mg total) by mouth at bedtime. 03/11/13  Yes [name], MD  omega-3 acid ethyl esters (LOVAZA) 1 G capsule Take 1 g by mouth 2 (two) times daily.   Yes Historical Provider, MD   omeprazole (PRILOSEC) 20 MG capsule Take 1 capsule (20 mg total) by mouth 2 (two) times daily. 04/10/13  Yes Ileana Ladd, MD  potassium chloride SA (K-DUR,KLOR-CON) 20 MEQ tablet Take 1 tablet (20 mEq total) by mouth 3 (three) times daily. 02/13/13  Yes Ileana Ladd, MD  triamterene-hydrochlorothiazide (DYAZIDE) 37.5-25 MG per capsule Take 1 each (1 capsule total) by mouth every morning. 04/10/13  Yes Ileana Ladd, MD  Venlafaxine HCl 150 MG TB24 Take 1 tablet (150 mg total) by mouth every morning. 02/13/13  Yes Ileana Ladd, MD     ROS: As above in the HPI. All other systems are stable or negative.  OBJECTIVE: APPEARANCE:  Patient in no acute distress.The patient appeared well nourished and normally developed. Acyanotic. Waist: VITAL SIGNS:BP 137/76  Pulse 75  Temp(Src) 97.1 F (36.2 C) (Oral)  Ht 5' 3.5" (1.613 m)  Wt 216 lb 9.6 oz (98.249 kg)  BMI 37.76 kg/m2 Obese WF Sad  SKIN: warm and  Dry without overt rashes, tattoos and scars  HEAD and Neck: without JVD, Head and scalp: normal Eyes:No scleral icterus. Fundi normal, eye movements normal. Ears: Auricle normal, canal normal, Tympanic membranes normal, insufflation normal. Nose: normal Throat: normal Neck & thyroid: normal  CHEST & LUNGS: Chest wall: normal Lungs: Clear  CVS: Reveals the PMI to be normally located. Regular rhythm, First and Second Heart sounds are normal,  absence of murmurs, rubs or gallops. Peripheral vasculature: Radial pulses: normal Dorsal pedis pulses: normal  Posterior pulses: normal  ABDOMEN:  Appearance: Obese Benign, no organomegaly, no masses, no Abdominal Aortic enlargement. No Guarding , no rebound. No Bruits. Bowel sounds: normal  RECTAL: normal tone and normal stools. No impaction. Heme negative. GU: N/A  EXTREMETIES: nonedematous.  MUSCULOSKELETAL:  Spine: normal Joints: intact  NEUROLOGIC: oriented to time,place and person; nonfocal. Strength is  normal Sensory is normal Reflexes are normal Cranial Nerves are normal. Psychiatry: grieving over the death of her sister  ASSESSMENT: Frequency - Plan: POCT UA - Microscopic Only, POCT urinalysis dipstick, Urine culture  HYPERLIPIDEMIA-MIXED - Plan: CMP14+EGFR, NMR, lipoprofile  HYPERTENSION, UNSPECIFIED - Plan: CMP14+EGFR  Grieving - sister died recently  Supraventricular tachycardia, nonsustained - Plan: Magnesium  Diarrhea Suspect that the magnesium is causing frequency of stools.  PLAN: Grief counseling with hospice Supportive therapy. Reducing the excess fiber aggravating the loose stools.  Orders Placed This Encounter  Procedures  . Urine culture  . CMP14+EGFR  . NMR, lipoprofile  . Magnesium  . POCT UA - Microscopic Only  . POCT urinalysis dipstick  no change in medication regimen for her lipids and BP.  No orders of the defined types were placed in this encounter.   Medications Discontinued During This Encounter  Medication Reason  . cephALEXin (KEFLEX) 500 MG capsule Completed Course  . phenazopyridine (PYRIDIUM) 200 MG tablet Completed Course  . trimethoprim (TRIMPEX) 100 MG tablet Completed Course   Return in about 3 months (around 07/12/2013) for Recheck medical problems.  Meribeth Vitug P. [place] Charon, [name]

## 2013-04-11 NOTE — Telephone Encounter (Signed)
Call patient : Prescription refilled & sent to pharmacy in [place]. 

## 2013-04-11 NOTE — Telephone Encounter (Signed)
Pt seen by dr [place] Charon today and was addressed

## 2013-04-11 NOTE — Progress Notes (Signed)
Patient ID: [name], female   DOB: Mar 14, 1951, 62 y.o.   MRN: [id number] SUBJECTIVE: CC: Chief Complaint  Patient presents with  . Follow-up    3 month follow up states having fecal incont.     HPI:  Past Medical History  Diagnosis Date  . Borderline hypertension   . Obesity   . Hyperlipidemia   . Hypothyroidism     [name]   . Fe deficiency anemia   . Cardiomegaly     NL LVF (EF 55-60%), diastolic dysfunction, no significant valvular abnormalities, 02/2010  . Hiatal hernia   . PVT (paroxysmal ventricular tachycardia) 8/05    rt, leg ortho Sx   . BBB (bundle branch block)     as per EKG   . Recurrent cystitis     Dr. Aldean Ast   . Chronic lower back pain   . Lumbar herniated disc     L4-L5   . History of mixed drug abuse   . Anxiety state, unspecified   . Acute cystitis   . Personal history of arthritis   . Other chronic cystitis   . Depressive disorder, not elsewhere classified   . Unspecified symptom associated with female genital organs   . Chest pain     Negative dobutamine stress echo, 06/2008; NL cardiac catheterization, 2004  . Heartburn   . Microscopic hematuria   . Personal history of venous thrombosis and embolism    Past Surgical History  Procedure Laterality Date  . Appendectomy  2004?or 11/03?  [name] Cardiac catheterization  2004    negative   . Partial hysterectomy  2004    + one ovary   . Right knee arthroscopy  8/05  . Right knee replacement  8/05  . Carpal tunnel release      x2   . Shoulder surgery    . Roctocele- lomax  05/2001  . Liver biopsy  2002    Dr. Katrinka Blazing   . Bladder repair    . Rectal prolapse repair    . Back surgery     History   Social History  . Marital Status: Single    Spouse Name: N/A    Number of Children: 5  . Years of Education: N/A   Occupational History  . disabled     Social History Main Topics  . Smoking status: Never Smoker   . Smokeless tobacco: Never Used  . Alcohol Use: No  . Drug Use: Not on  file     Comment: history of abuse   . Sexual Activity: Not on file   Other Topics Concern  . Not on file   Social History Narrative  . No narrative on file   Family History  Problem Relation Age of Onset  . Heart attack      family history   . Coronary artery disease      Family History   . Breast cancer Mother   . Breast cancer Mother    Current Outpatient Prescriptions on File Prior to Visit  Medication Sig Dispense Refill  . aspirin EC 81 MG tablet Take by mouth. Take 81 mg by mouth daily.      . Calcium Carbonate-Vitamin D (CALCIUM 500 + D) 500-125 MG-UNIT TABS Take by mouth. Take 1 tablet by mouth every morning.      . cephALEXin (KEFLEX) 500 MG capsule Take 1 capsule (500 mg total) by mouth 2 (two) times daily.  14 capsule  0  . Cholecalciferol (VITAMIN D-3 PO)  Take 1 capsule by mouth every morning.       . clonazePAM (KLONOPIN) 2 MG tablet Take 1 tablet (2 mg total) by mouth 2 (two) times daily as needed for anxiety.  60 tablet  2  . cyclobenzaprine (FLEXERIL) 10 MG tablet Take 1 tablet (10 mg total) by mouth 3 (three) times daily as needed. For muscle spasms  90 tablet  2  . cycloSPORINE (RESTASIS) 0.05 % ophthalmic emulsion Place 1 drop into both eyes 2 (two) times daily. Apply to eye. Place 1 drop into both eyes 2 (two) times daily.  0.4 mL  2  . estradiol (ESTRACE) 0.1 MG/GM vaginal cream Place vaginally. Place 2 g vaginally daily.      [name] ezetimibe-simvastatin (VYTORIN) 10-40 MG per tablet Take 1 tablet by mouth at bedtime.  30 tablet  4  . levothyroxine (SYNTHROID, LEVOTHROID) 50 MCG tablet Take 1 tablet (50 mcg total) by mouth daily before breakfast.  30 tablet  6  . lubiprostone (AMITIZA) 24 MCG capsule Take 1 capsule (24 mcg total) by mouth 2 (two) times daily with a meal.  60 capsule  2  . Magnesium 250 MG TABS Take 500 mg by mouth daily.      . meloxicam (MOBIC) 15 MG tablet Take 1 tablet (15 mg total) by mouth daily.  30 tablet  0  . metoprolol succinate  (TOPROL-XL) 50 MG 24 hr tablet Take 1 tablet (50 mg total) by mouth 2 (two) times daily. Take with or immediately following a meal.  60 tablet  11  . niacin (NIASPAN) 500 MG CR tablet Take 3 tablets (1,500 mg total) by mouth at bedtime.  90 tablet  3  . omega-3 acid ethyl esters (LOVAZA) 1 G capsule Take 1 g by mouth 2 (two) times daily.      [name] omeprazole (PRILOSEC) 20 MG capsule Take 1 capsule (20 mg total) by mouth 2 (two) times daily.  60 capsule  2  . phenazopyridine (PYRIDIUM) 200 MG tablet Take 200 mg by mouth every 6 (six) hours as needed for pain.      . potassium chloride SA (K-DUR,KLOR-CON) 20 MEQ tablet Take 1 tablet (20 mEq total) by mouth 3 (three) times daily.  90 tablet  2  . triamterene-hydrochlorothiazide (DYAZIDE) 37.5-25 MG per capsule Take 1 each (1 capsule total) by mouth every morning.  30 capsule  2  . trimethoprim (TRIMPEX) 100 MG tablet Take by mouth. Take 50 mg by mouth daily.      . Venlafaxine HCl 150 MG TB24 Take 1 tablet (150 mg total) by mouth every morning.  30 each  2   No current facility-administered medications on file prior to visit.   Allergies  Allergen Reactions  . Clindamycin     REACTION: caused C-diff per patient  . Diphenhydramine Hcl Other (See Comments)    REACTION:  Severely drowsy   Immunization History  Administered Date(s) Administered  . Influenza-Generic 03/06/2013   Prior to Admission medications   Medication Sig Start Date End Date Taking? Authorizing Provider  aspirin EC 81 MG tablet Take by mouth. Take 81 mg by mouth daily.   Yes Historical Provider, MD  Calcium Carbonate-Vitamin D (CALCIUM 500 + D) 500-125 MG-UNIT TABS Take by mouth. Take 1 tablet by mouth every morning.   Yes Historical Provider, MD  cephALEXin (KEFLEX) 500 MG capsule Take 1 capsule (500 mg total) by mouth 2 (two) times daily. 01/06/13  Yes Ileana Ladd, MD  Cholecalciferol (VITAMIN  D-3 PO) Take 1 capsule by mouth every morning.    Yes Historical Provider, MD   clonazePAM (KLONOPIN) 2 MG tablet Take 1 tablet (2 mg total) by mouth 2 (two) times daily as needed for anxiety. 03/11/13  Yes Ileana Ladd, MD  cyclobenzaprine (FLEXERIL) 10 MG tablet Take 1 tablet (10 mg total) by mouth 3 (three) times daily as needed. For muscle spasms 04/10/13  Yes Ileana Ladd, MD  cycloSPORINE (RESTASIS) 0.05 % ophthalmic emulsion Place 1 drop into both eyes 2 (two) times daily. Apply to eye. Place 1 drop into both eyes 2 (two) times daily. 02/13/13  Yes Ileana Ladd, MD  estradiol (ESTRACE) 0.1 MG/GM vaginal cream Place vaginally. Place 2 g vaginally daily.   Yes Historical Provider, MD  ezetimibe-simvastatin (VYTORIN) 10-40 MG per tablet Take 1 tablet by mouth at bedtime. 01/29/13  Yes Mary-Margaret Daphine Deutscher, FNP  levothyroxine (SYNTHROID, LEVOTHROID) 50 MCG tablet Take 1 tablet (50 mcg total) by mouth daily before breakfast. 02/12/13  Yes Mary-Margaret Daphine Deutscher, FNP  lubiprostone (AMITIZA) 24 MCG capsule Take 1 capsule (24 mcg total) by mouth 2 (two) times daily with a meal. 02/13/13  Yes Ileana Ladd, MD  Magnesium 250 MG TABS Take 500 mg by mouth daily.   Yes Historical Provider, MD  meloxicam (MOBIC) 15 MG tablet Take 1 tablet (15 mg total) by mouth daily. 01/02/13  Yes Ileana Ladd, MD  metoprolol succinate (TOPROL-XL) 50 MG 24 hr tablet Take 1 tablet (50 mg total) by mouth 2 (two) times daily. Take with or immediately following a meal. 12/11/12  Yes Rande Brunt, PA-C  niacin (NIASPAN) 500 MG CR tablet Take 3 tablets (1,500 mg total) by mouth at bedtime. 03/11/13  Yes [name], MD  omega-3 acid ethyl esters (LOVAZA) 1 G capsule Take 1 g by mouth 2 (two) times daily.   Yes Historical Provider, MD  omeprazole (PRILOSEC) 20 MG capsule Take 1 capsule (20 mg total) by mouth 2 (two) times daily. 04/10/13  Yes Ileana Ladd, MD  phenazopyridine (PYRIDIUM) 200 MG tablet Take 200 mg by mouth every 6 (six) hours as needed for pain.   Yes Historical Provider, MD  potassium  chloride SA (K-DUR,KLOR-CON) 20 MEQ tablet Take 1 tablet (20 mEq total) by mouth 3 (three) times daily. 02/13/13  Yes Ileana Ladd, MD  triamterene-hydrochlorothiazide (DYAZIDE) 37.5-25 MG per capsule Take 1 each (1 capsule total) by mouth every morning. 04/10/13  Yes Ileana Ladd, MD  trimethoprim (TRIMPEX) 100 MG tablet Take by mouth. Take 50 mg by mouth daily.   Yes Historical Provider, MD  Venlafaxine HCl 150 MG TB24 Take 1 tablet (150 mg total) by mouth every morning. 02/13/13  Yes Ileana Ladd, MD     ROS: As above in the HPI. All other systems are stable or negative.  OBJECTIVE: APPEARANCE:  Patient in no acute distress.The patient appeared well nourished and normally developed. Acyanotic. Waist: VITAL SIGNS:BP 137/76  Pulse 75  Temp(Src) 97.1 F (36.2 C) (Oral)  Ht 5' 3.5" (1.613 m)  Wt 216 lb 9.6 oz (98.249 kg)  BMI 37.76 kg/m2   SKIN: warm and  Dry without overt rashes, tattoos and scars  HEAD and Neck: without JVD, Head and scalp: normal Eyes:No scleral icterus. Fundi normal, eye movements normal. Ears: Auricle normal, canal normal, Tympanic membranes normal, insufflation normal. Nose: normal Throat: normal Neck & thyroid: normal  CHEST & LUNGS: Chest wall: normal Lungs: Clear  CVS: Reveals  the PMI to be normally located. Regular rhythm, First and Second Heart sounds are normal,  absence of murmurs, rubs or gallops. Peripheral vasculature: Radial pulses: normal Dorsal pedis pulses: normal Posterior pulses: normal  ABDOMEN:  Appearance: normal Benign, no organomegaly, no masses, no Abdominal Aortic enlargement. No Guarding , no rebound. No Bruits. Bowel sounds: normal  RECTAL: N/A GU: N/A  EXTREMETIES: nonedematous.  MUSCULOSKELETAL:  Spine: normal Joints: intact  NEUROLOGIC: oriented to time,place and person; nonfocal. Strength is normal Sensory is normal Reflexes are normal Cranial Nerves are normal.  ASSESSMENT: Frequency - Plan:  POCT UA - Microscopic Only, POCT urinalysis dipstick  PLAN:  Orders Placed This Encounter  Procedures  . POCT UA - Microscopic Only  . POCT urinalysis dipstick   No orders of the defined types were placed in this encounter.   There are no discontinued medications. No Follow-up on file.  Yonatan Guitron P. [place] Charon, [name]

## 2013-04-13 LAB — CMP14+EGFR
ALT: 23 IU/L (ref 0–32)
AST: 26 IU/L (ref 0–40)
Albumin/Globulin Ratio: 1.8 (ref 1.1–2.5)
Albumin: 4.3 g/dL (ref 3.6–4.8)
Alkaline Phosphatase: 76 IU/L (ref 39–117)
BUN/Creatinine Ratio: 24 (ref 11–26)
BUN: 24 mg/dL (ref 8–27)
CO2: 25 mmol/L (ref 18–29)
Calcium: 10 mg/dL (ref 8.6–10.2)
Chloride: 99 mmol/L (ref 97–108)
Creatinine, Ser: 0.99 mg/dL (ref 0.57–1.00)
GFR calc Af Amer: 71 mL/min/{1.73_m2} (ref >59)
GFR calc non Af Amer: 62 mL/min/{1.73_m2} (ref >59)
Globulin, Total: 2.4 g/dL (ref 1.5–4.5)
Glucose: 90 mg/dL (ref 65–99)
Potassium: 3.6 mmol/L (ref 3.5–5.2)
Sodium: 141 mmol/L (ref 134–144)
Total Bilirubin: 0.4 mg/dL (ref 0.0–1.2)
Total Protein: 6.7 g/dL (ref 6.0–8.5)

## 2013-04-13 LAB — NMR, LIPOPROFILE
Cholesterol: 165 mg/dL (ref <200)
HDL Cholesterol by NMR: 80 mg/dL (ref >=40)
HDL Particle Number: 45.1 umol/L (ref >=30.5)
LDL Particle Number: 505 nmol/L (ref <1000)
LDL Size: 21 nm (ref >20.5)
LDLC SERPL CALC-MCNC: 63 mg/dL (ref <100)
LP-IR Score: 29 (ref <=45)
Small LDL Particle Number: <90 nmol/L (ref <=527)
Triglycerides by NMR: 109 mg/dL (ref <150)

## 2013-04-13 LAB — MAGNESIUM: Magnesium: 1.9 mg/dL (ref 1.6–2.6)

## 2013-04-13 LAB — URINE CULTURE

## 2013-04-14 ENCOUNTER — Telehealth: Payer: Self-pay | Admitting: Family Medicine

## 2013-04-15 NOTE — Telephone Encounter (Signed)
Appt given for tomorrow

## 2013-04-16 ENCOUNTER — Ambulatory Visit (INDEPENDENT_AMBULATORY_CARE_PROVIDER_SITE_OTHER): Payer: Medicaid Other | Admitting: Family Medicine

## 2013-04-16 ENCOUNTER — Encounter: Payer: Self-pay | Admitting: Family Medicine

## 2013-04-16 VITALS — BP 101/61 | HR 69 | Temp 97.9°F | Ht 64.25 in | Wt 216.0 lb

## 2013-04-16 DIAGNOSIS — R928 Other abnormal and inconclusive findings on diagnostic imaging of breast: Secondary | ICD-10-CM

## 2013-04-16 DIAGNOSIS — N63 Unspecified lump in unspecified breast: Secondary | ICD-10-CM

## 2013-04-16 NOTE — Progress Notes (Signed)
  Subjective:    Patient ID: [name], female    DOB: 1950-06-22, 62 y.o.   MRN: [id number]  HPI Pt presents today with chief complaint of breast mass Pt was doing self check at home 2-3 days ago and felt palpable painful mass at 12oclock position in periareolar region on L breast. Mildly tender.  Pt had normal mammogram 05/2012 and reports never having abnormal mammogram.  + family hx/o terminal breast ca in mother who died at 70 from metastatic disease per pt.     Review of Systems  All other systems reviewed and are negative.       Objective:   Physical Exam  Constitutional: She appears well-developed and well-nourished.  HENT:  Head: Normocephalic and atraumatic.  Eyes: Conjunctivae are normal. Pupils are equal, round, and reactive to light.  Neck: Normal range of motion.  Cardiovascular: Normal rate and regular rhythm.   Pulmonary/Chest: Effort normal and breath sounds normal.    ? Palpable mass in this region. Minimally to mildly tender. No erythema or drainage.   Abdominal: Soft.  Musculoskeletal: Normal range of motion.  Neurological: She is alert.  Skin: Skin is warm.          Assessment & Plan:  Breast nodule - Plan: US Breast Bilateral  Will send pt for diagnostic breast ultrasound for further evaluation of mass.  Discussed general care and follow up.  Unclear if pt would benefit from genetic testing given family history. Has had several normal mammograms in the past.  Follow up pending breast ultrasound.

## 2013-04-16 NOTE — Patient Instructions (Signed)
Call [phone] if you haven't heard anything about the breast ultrasound on Thursday.

## 2013-04-17 ENCOUNTER — Other Ambulatory Visit: Payer: Self-pay | Admitting: Family Medicine

## 2013-04-17 DIAGNOSIS — N63 Unspecified lump in unspecified breast: Secondary | ICD-10-CM

## 2013-04-28 ENCOUNTER — Ambulatory Visit
Admission: RE | Admit: 2013-04-28 | Discharge: 2013-04-28 | Disposition: A | Discharge status: Home / Self Care | Payer: Medicaid Other | Source: Ambulatory Visit | Attending: Family Medicine | Admitting: Family Medicine

## 2013-04-28 DIAGNOSIS — N63 Unspecified lump in unspecified breast: Secondary | ICD-10-CM

## 2013-05-05 ENCOUNTER — Other Ambulatory Visit: Payer: Medicaid Other

## 2013-05-09 ENCOUNTER — Other Ambulatory Visit: Payer: Self-pay | Admitting: *Deleted

## 2013-05-09 MED ORDER — VENLAFAXINE HCL ER 150 MG PO TB24: 1.0000 | ORAL_TABLET | Freq: Every morning | ORAL | Status: DC

## 2013-05-09 MED ORDER — LUBIPROSTONE 24 MCG PO CAPS: 24.0000 ug | ORAL_CAPSULE | Freq: Two times a day (BID) | ORAL | Status: DC

## 2013-05-09 MED ORDER — CYCLOSPORINE 0.05 % OP EMUL: 1.0000 [drp] | Freq: Two times a day (BID) | OPHTHALMIC | Status: DC

## 2013-05-09 MED ORDER — POTASSIUM CHLORIDE CRYS ER 20 MEQ PO TBCR: 20.0000 meq | EXTENDED_RELEASE_TABLET | Freq: Three times a day (TID) | ORAL | Status: DC

## 2013-05-14 ENCOUNTER — Ambulatory Visit (INDEPENDENT_AMBULATORY_CARE_PROVIDER_SITE_OTHER): Payer: Medicaid Other | Admitting: Cardiology

## 2013-05-14 ENCOUNTER — Encounter: Payer: Self-pay | Admitting: Cardiology

## 2013-05-14 VITALS — BP 106/71 | HR 70 | Ht 65.0 in | Wt 216.1 lb

## 2013-05-14 DIAGNOSIS — E785 Hyperlipidemia, unspecified: Secondary | ICD-10-CM

## 2013-05-14 DIAGNOSIS — I471 Supraventricular tachycardia: Secondary | ICD-10-CM

## 2013-05-14 NOTE — Progress Notes (Signed)
Clinical Summary [name] is a 62 y.o.female last seen in July by Mr. Serpe PA-C, a former patient of Dr. Andee Lineman. This is our first meeting in the office. History reviewed.  She reports no palpitations since last visit, no obvious breakthrough SVT. She states that she has been compliant with her Toprol-XL. Avoids caffeine. ECG from earlier this year reviewed finding sinus rhythm with incomplete right bundle branch block.  Echocardiogram from July showed mild LVH with LVEF 55-60%, normal diastolic parameters, mild left atrial, mild tricuspid regurgitation, RV-RA gradient 22 mm mercury.   Allergies  Allergen Reactions  . Clindamycin     REACTION: caused C-diff per patient  . Diphenhydramine Hcl Other (See Comments)    REACTION:  Severely drowsy    Current Outpatient Prescriptions  Medication Sig Dispense Refill  . aspirin EC 81 MG tablet Take by mouth. Take 81 mg by mouth daily.      . Calcium Carbonate-Vitamin D (CALCIUM 500 + D) 500-125 MG-UNIT TABS Take by mouth. Take 1 tablet by mouth every morning.      . Cholecalciferol (VITAMIN D-3 PO) Take 1 capsule by mouth every morning.       . clonazePAM (KLONOPIN) 2 MG tablet Take 1 tablet (2 mg total) by mouth 2 (two) times daily as needed for anxiety.  60 tablet  2  . cyclobenzaprine (FLEXERIL) 10 MG tablet Take 1 tablet (10 mg total) by mouth 3 (three) times daily as needed. For muscle spasms  90 tablet  2  . cycloSPORINE (RESTASIS) 0.05 % ophthalmic emulsion Place 1 drop into both eyes 2 (two) times daily. Apply to eye. Place 1 drop into both eyes 2 (two) times daily.  0.4 mL  2  . estradiol (ESTRACE) 0.1 MG/GM vaginal cream Place vaginally. Place 2 g vaginally daily.      [name] ezetimibe-simvastatin (VYTORIN) 10-40 MG per tablet Take 1 tablet by mouth at bedtime.  30 tablet  4  . levothyroxine (SYNTHROID, LEVOTHROID) 50 MCG tablet Take 1 tablet (50 mcg total) by mouth daily before breakfast.  30 tablet  6  . lubiprostone (AMITIZA) 24  MCG capsule Take 1 capsule (24 mcg total) by mouth 2 (two) times daily with a meal.  60 capsule  2  . metoprolol succinate (TOPROL-XL) 50 MG 24 hr tablet Take 1 tablet (50 mg total) by mouth 2 (two) times daily. Take with or immediately following a meal.  60 tablet  11  . niacin (NIASPAN) 500 MG CR tablet Take 3 tablets (1,500 mg total) by mouth at bedtime.  90 tablet  3  . omega-3 acid ethyl esters (LOVAZA) 1 G capsule Take 1 g by mouth 2 (two) times daily.      [name] omeprazole (PRILOSEC) 20 MG capsule Take 1 capsule (20 mg total) by mouth 2 (two) times daily.  60 capsule  2  . potassium chloride SA (K-DUR,KLOR-CON) 20 MEQ tablet Take 1 tablet (20 mEq total) by mouth 3 (three) times daily.  90 tablet  2  . triamterene-hydrochlorothiazide (DYAZIDE) 37.5-25 MG per capsule Take 1 each (1 capsule total) by mouth every morning.  30 capsule  2  . Venlafaxine HCl 150 MG TB24 Take 1 tablet (150 mg total) by mouth every morning.  30 each  2   No current facility-administered medications for this visit.    Past Medical History  Diagnosis Date  . Borderline hypertension   . Obesity   . Hyperlipidemia   . Hypothyroidism  [name]   . Fe deficiency anemia   . Hiatal hernia   . PSVT (paroxysmal supraventricular tachycardia) 8/05  . Recurrent cystitis     Dr. Aldean Ast   . Chronic lower back pain   . Lumbar herniated disc     L4-L5   . History of mixed drug abuse   . Anxiety   . Arthritis   . Depression   . History of cardiac catheterization     Normal coronaries  . Heartburn   . Microscopic hematuria   . Personal history of venous thrombosis and embolism     Social History Ms. Goering reports that she has never smoked. She has never used smokeless tobacco. Ms. Mcquade reports that she does not drink alcohol.  Review of Systems Tells me that she has been undergoing some imaging of her left breast due to question of palpable nodule. Otherwise negative.  Physical Examination Filed  Vitals:   05/14/13 1042  BP: 106/71  Pulse: 70   Filed Weights   05/14/13 1042  Weight: 216 lb 1.8 oz (98.028 kg)   Patient appears comfortable at rest. HEENT: Conjunctiva and lids normal, oropharynx clear. Neck: Supple, no elevated JVP or carotid bruits, no thyromegaly. Lungs: Clear to auscultation, nonlabored breathing at rest. Cardiac: Regular rate and rhythm, no S3 or significant systolic murmur, no pericardial rub. Extremities: No pitting edema, distal pulses 2+. Skin: Warm and dry. Musculoskeletal: No kyphosis. Neuropsychiatric: Alert and oriented x3, affect grossly appropriate.   Problem List and Plan   PSVT (paroxysmal supraventricular tachycardia) Symptomatically stable on beta blocker. LVEF 55-60% with normal diastolic parameters in July of this year. Continue observation and annual followup.  HYPERLIPIDEMIA-MIXED Followed by Dr. Modesto Charon on Niaspan, Vytorin, and omega-3 supplements.    Jonelle Sidle, [name], F.A.C.C.

## 2013-05-14 NOTE — Assessment & Plan Note (Signed)
Symptomatically stable on beta blocker. LVEF 55-60% with normal diastolic parameters in July of this year. Continue observation and annual followup.

## 2013-05-14 NOTE — Patient Instructions (Signed)

## 2013-05-14 NOTE — Assessment & Plan Note (Signed)
Followed by Dr. Modesto Charon on Niaspan, Vytorin, and omega-3 supplements.

## 2013-05-15 ENCOUNTER — Other Ambulatory Visit: Payer: Self-pay | Admitting: *Deleted

## 2013-06-09 ENCOUNTER — Other Ambulatory Visit: Payer: Self-pay | Admitting: *Deleted

## 2013-06-09 NOTE — Telephone Encounter (Signed)
Last filled 05/06/13, last seen 04/11/13. Route to [facility]. Also, i dont see chlorthalidone on med list

## 2013-06-10 MED ORDER — CHLORTHALIDONE 25 MG PO TABS: 25.0000 mg | ORAL_TABLET | Freq: Two times a day (BID) | ORAL | Status: DC

## 2013-06-10 MED ORDER — CLONAZEPAM 2 MG PO TABS: 2.0000 mg | ORAL_TABLET | Freq: Two times a day (BID) | ORAL | Status: DC | PRN

## 2013-06-10 NOTE — Telephone Encounter (Signed)
Called ifn

## 2013-06-10 NOTE — Telephone Encounter (Signed)
Rx ready for nurse to Phone in. 

## 2013-06-30 ENCOUNTER — Ambulatory Visit: Payer: Medicaid Other

## 2013-07-01 ENCOUNTER — Ambulatory Visit (INDEPENDENT_AMBULATORY_CARE_PROVIDER_SITE_OTHER): Payer: Medicaid Other

## 2013-07-01 ENCOUNTER — Ambulatory Visit
Admission: RE | Admit: 2013-07-01 | Discharge: 2013-07-01 | Disposition: A | Discharge status: Home / Self Care | Payer: Medicaid Other | Source: Ambulatory Visit

## 2013-07-01 ENCOUNTER — Ambulatory Visit (INDEPENDENT_AMBULATORY_CARE_PROVIDER_SITE_OTHER): Payer: Medicaid Other | Admitting: Nurse Practitioner

## 2013-07-01 VITALS — BP 110/61 | HR 69 | Temp 96.9°F | Ht 65.0 in | Wt 216.0 lb

## 2013-07-01 DIAGNOSIS — Z1231 Encounter for screening mammogram for malignant neoplasm of breast: Secondary | ICD-10-CM

## 2013-07-01 DIAGNOSIS — M25511 Pain in right shoulder: Secondary | ICD-10-CM

## 2013-07-01 DIAGNOSIS — M25512 Pain in left shoulder: Secondary | ICD-10-CM

## 2013-07-01 DIAGNOSIS — R32 Unspecified urinary incontinence: Secondary | ICD-10-CM

## 2013-07-01 DIAGNOSIS — M25519 Pain in unspecified shoulder: Secondary | ICD-10-CM

## 2013-07-01 LAB — POCT UA - MICROSCOPIC ONLY
Bacteria, U Microscopic: NEGATIVE
CASTS, UR, LPF, POC: NEGATIVE
Crystals, Ur, HPF, POC: NEGATIVE
Epithelial cells, urine per micros: NEGATIVE
RBC, urine, microscopic: NEGATIVE
Yeast, UA: NEGATIVE

## 2013-07-01 LAB — POCT URINALYSIS DIPSTICK
Bilirubin, UA: NEGATIVE
Blood, UA: NEGATIVE
Glucose, UA: NEGATIVE
Ketones, UA: NEGATIVE
NITRITE UA: NEGATIVE
PH UA: 5
PROTEIN UA: NEGATIVE
Spec Grav, UA: 1.025
Urobilinogen, UA: NEGATIVE

## 2013-07-01 NOTE — Patient Instructions (Signed)
Urinary Incontinence Urinary incontinence is the involuntary loss of urine from your bladder. CAUSES  There are many causes of urinary incontinence. They include:  Medicines.  Infections.  Prostatic enlargement, leading to overflow of urine from your bladder.  Surgery.  Neurological diseases.  Emotional factors. SIGNS AND SYMPTOMS Urinary Incontinence can be divided into four types: 1. Urge incontinence. Urge incontinence is the involuntary loss of urine before you have the opportunity to go to the bathroom. There is a sudden urge to void but not enough time to reach a bathroom. 2. Stress incontinence. Stress incontinence is the sudden loss of urine with any activity that forces urine to pass. It is commonly caused by anatomical changes to the pelvis and sphincter areas of your body. 3. Overflow incontinence. Overflow incontinence is the loss of urine from an obstructed opening to your bladder. This results in a backup of urine and a resultant buildup of pressure within the bladder. When the pressure within the bladder exceeds the closing pressure of the sphincter, the urine overflows, which causes incontinence, similar to water overflowing a dam. 4. Total incontinence. Total incontinence is the loss of urine as a result of the inability to store urine within your bladder. DIAGNOSIS  Evaluating the cause of incontinence may require:  A thorough and complete medical and obstetric history.  A complete physical exam.  Laboratory tests such as a urine culture and sensitivities. When additional tests are indicated, they can include:  An ultrasound exam.  Kidney and bladder X-rays.  Cystoscopy. This is an exam of the bladder using a narrow scope.  Urodynamic testing to test the nerve function to the bladder and sphincter areas. TREATMENT  Treatment for urinary incontinence depends on the cause:  For urge incontinence caused by a bacterial infection, antibiotics will be prescribed.  If the urge incontinence is related to medicines you take, your health care provider may have you change the medicine.  For stress incontinence, surgery to re-establish anatomical support to the bladder or sphincter, or both, will often correct the condition.  For overflow incontinence caused by an enlarged prostate, an operation to open the channel through the enlarged prostate will allow the flow of urine out of the bladder. In women with fibroids, a hysterectomy may be recommended.  For total incontinence, surgery on your urinary sphincter may help. An artificial urinary sphincter (an inflatable cuff placed around the urethra) may be required. In women who have developed a hole-like passage between their bladder and vagina (vesicovaginal fistula), surgery to close the fistula often is required. HOME CARE INSTRUCTIONS  Normal daily hygiene and the use of pads or adult diapers that are changed regularly will help prevent odors and skin damage.  Avoid caffeine. It can overstimulate your bladder.  Use the bathroom regularly. Try about every 2 3 hours to go to the bathroom, even if you do not feel the need to do so. Take time to empty your bladder completely. After urinating, wait a minute. Then try to urinate again.  For causes involving nerve dysfunction, keep a log of the medicines you take and a journal of the times you go to the bathroom. SEEK MEDICAL CARE IF:  You experience worsening of pain instead of improvement in pain after your procedure.  Your incontinence becomes worse instead of better. SEE IMMEDIATE MEDICAL CARE IF:  You experience fever or shaking chills.  You are unable to pass your urine.  You have redness spreading into your groin or down into your thighs. MAKE   SURE YOU:   Understand these instructions.   Will watch your condition.  Will get help right away if you are not doing well or get worse. Document Released: 07/06/2004 Document Revised: 03/19/2013 Document  Reviewed: 11/05/2012 [employer] Patient Information 2014 [employer].  

## 2013-07-01 NOTE — Progress Notes (Signed)
   Subjective:    Patient ID: Lavella HammockSandra Stockham, female    DOB: 04/30/1951, 63 y.o.   MRN: [id number]  HPI Pt seen today for c/o shoulder pain x 2 weeks.  Aching pain is rated 8/10 and is constant.  Denies alleviating and aggravating factors.  Denies fever, chills, sob, numbness/tingling arms or hands, injury.  C/o urinary incontinence over the last week during sleep.  States urine is dark colored and foul smelling.  Admits hx of bladder infection.  Denies frequency or burning.     Review of Systems  Constitutional: Negative.  Negative for fatigue.  HENT: Negative.   Eyes: Negative.   Respiratory: Negative.  Negative for chest tightness.   Cardiovascular: Negative for chest pain and palpitations.  Gastrointestinal: Negative.  Negative for abdominal pain.  Endocrine: Negative.   Genitourinary: Positive for enuresis. Negative for urgency, frequency, hematuria and flank pain.  Musculoskeletal: Positive for back pain.  Allergic/Immunologic: Negative.   Neurological: Negative.   Psychiatric/Behavioral: Negative.   All other systems reviewed and are negative.       Objective:   Physical Exam  Constitutional: She appears well-developed and well-nourished.  Cardiovascular: Normal rate, regular rhythm and normal heart sounds.   Pulmonary/Chest: Effort normal and breath sounds normal.  Abdominal: Soft. Bowel sounds are normal. She exhibits no distension. There is no tenderness.  Genitourinary:  No CVA tenderness  Musculoskeletal: She exhibits tenderness (paraspinal tenderness bil).  FROM of bil shoulders with pain on internal roattion bil Grips equal bilaterally Motor strength and sensation intact bil  Neurological: She has normal reflexes.    BP 110/61  Pulse 69  Temp(Src) 96.9 F (36.1 C) (Oral)  Ht 5\' 5"  (1.651 m)  Wt 216 lb (97.977 kg)  BMI 35.94 kg/m2 Results for orders placed in visit on 07/01/13  POCT URINALYSIS DIPSTICK      Result Value Range   Color, UA yellow     Clarity, UA clear     Glucose, UA negative     Bilirubin, UA negative     Ketones, UA negative     Spec Grav, UA 1.025     Blood, UA negative     pH, UA 5.0     Protein, UA negative     Urobilinogen, UA negative     Nitrite, UA negative     Leukocytes, UA Trace    POCT UA - MICROSCOPIC ONLY      Result Value Range   WBC, Ur, HPF, POC 5-7     RBC, urine, microscopic negative     Bacteria, U Microscopic negative     Mucus, UA neagtive     Epithelial cells, urine per micros negative     Crystals, Ur, HPF, POC negative     Casts, Ur, LPF, POC negative     Yeast, UA negative          Assessment & Plan:  1. Incontinence Do not drink 2 hours prior to bedtime kegel exercises Will send to urologist if doesn't improve - POCT urinalysis dipstick - POCT UA - Microscopic Only  2. Shoulder pain, bilateral *moist heat Shoulder stretching - DG Shoulder Left; Future - DG Shoulder Right; Future  Mary-Margaret Daphine DeutscherMartin, FNP

## 2013-07-02 ENCOUNTER — Telehealth: Payer: Self-pay | Admitting: *Deleted

## 2013-07-02 DIAGNOSIS — M25511 Pain in right shoulder: Secondary | ICD-10-CM

## 2013-07-02 NOTE — Telephone Encounter (Signed)
Referral made 

## 2013-07-02 NOTE — Telephone Encounter (Signed)
PT WANTS TO [facility] FOR SHOULDER IF THIS IS OKAY WITH YOU.

## 2013-07-09 ENCOUNTER — Other Ambulatory Visit: Payer: Self-pay | Admitting: *Deleted

## 2013-07-09 MED ORDER — TRIAMTERENE-HCTZ 37.5-25 MG PO CAPS: 1.0000 | ORAL_CAPSULE | ORAL | Status: DC

## 2013-07-09 MED ORDER — OMEPRAZOLE 20 MG PO CPDR: 20.0000 mg | DELAYED_RELEASE_CAPSULE | Freq: Two times a day (BID) | ORAL | Status: DC

## 2013-07-15 ENCOUNTER — Ambulatory Visit: Payer: Medicaid Other | Admitting: Urology

## 2013-07-18 ENCOUNTER — Ambulatory Visit (INDEPENDENT_AMBULATORY_CARE_PROVIDER_SITE_OTHER): Payer: Medicaid Other | Admitting: Family Medicine

## 2013-07-18 ENCOUNTER — Encounter: Payer: Self-pay | Admitting: Family Medicine

## 2013-07-18 VITALS — BP 107/61 | HR 66 | Temp 97.2°F | Ht 64.25 in | Wt 221.6 lb

## 2013-07-18 DIAGNOSIS — E559 Vitamin D deficiency, unspecified: Secondary | ICD-10-CM

## 2013-07-18 DIAGNOSIS — I471 Supraventricular tachycardia, unspecified: Secondary | ICD-10-CM

## 2013-07-18 DIAGNOSIS — E785 Hyperlipidemia, unspecified: Secondary | ICD-10-CM

## 2013-07-18 DIAGNOSIS — I10 Essential (primary) hypertension: Secondary | ICD-10-CM

## 2013-07-18 DIAGNOSIS — R350 Frequency of micturition: Secondary | ICD-10-CM

## 2013-07-18 DIAGNOSIS — E039 Hypothyroidism, unspecified: Secondary | ICD-10-CM | POA: Insufficient documentation

## 2013-07-18 DIAGNOSIS — F4321 Adjustment disorder with depressed mood: Secondary | ICD-10-CM

## 2013-07-18 LAB — POCT URINALYSIS DIPSTICK
Bilirubin, UA: NEGATIVE
Blood, UA: NEGATIVE
Glucose, UA: NEGATIVE
Ketones, UA: NEGATIVE
Nitrite, UA: NEGATIVE
Protein, UA: NEGATIVE
Spec Grav, UA: 1.01
Urobilinogen, UA: NEGATIVE
pH, UA: 6.5

## 2013-07-18 LAB — POCT UA - MICROSCOPIC ONLY
Bacteria, U Microscopic: NEGATIVE
Casts, Ur, LPF, POC: NEGATIVE
Crystals, Ur, HPF, POC: NEGATIVE
Mucus, UA: NEGATIVE
RBC, urine, microscopic: NEGATIVE
Yeast, UA: NEGATIVE
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
## 2013-07-18 NOTE — Patient Instructions (Signed)
      Dr Lashawnna Lambrecht's Recommendations  For nutrition information, I recommend books:  1).Eat to Live by Dr Joel Fuhrman. 2).Prevent and Reverse Heart Disease by Dr Caldwell Esselstyn. 3) Dr Neal Barnard's Book:  Program to Reverse Diabetes  Exercise recommendations are:  If unable to walk, then the patient can exercise in a chair 3 times a day. By flapping arms like a bird gently and raising legs outwards to the front.  If ambulatory, the patient can go for walks for 30 minutes 3 times a week. Then increase the intensity and duration as tolerated.  Goal is to try to attain exercise frequency to 5 times a week.  If applicable: Best to perform resistance exercises (machines or weights) 2 days a week and cardio type exercises 3 days per week.  

## 2013-07-20 ENCOUNTER — Other Ambulatory Visit: Payer: Self-pay | Admitting: Family Medicine

## 2013-07-20 DIAGNOSIS — N390 Urinary tract infection, site not specified: Secondary | ICD-10-CM

## 2013-07-20 LAB — NMR, LIPOPROFILE
Cholesterol: 184 mg/dL (ref <200)
HDL Cholesterol by NMR: 86 mg/dL (ref >=40)
HDL Particle Number: 51.3 umol/L (ref >=30.5)
LDL Particle Number: 860 nmol/L (ref <1000)
LDL Size: 21.3 nm (ref >20.5)
LDLC SERPL CALC-MCNC: 80 mg/dL (ref <100)
LP-IR Score: 29 (ref <=45)
Small LDL Particle Number: 247 nmol/L (ref <=527)
Triglycerides by NMR: 92 mg/dL (ref <150)

## 2013-07-20 LAB — CMP14+EGFR
ALT: 19 IU/L (ref 0–32)
AST: 18 IU/L (ref 0–40)
Albumin/Globulin Ratio: 1.9 (ref 1.1–2.5)
Albumin: 4.3 g/dL (ref 3.6–4.8)
Alkaline Phosphatase: 72 IU/L (ref 39–117)
BUN/Creatinine Ratio: 26 (ref 11–26)
BUN: 23 mg/dL (ref 8–27)
CO2: 28 mmol/L (ref 18–29)
Calcium: 9.6 mg/dL (ref 8.7–10.3)
Chloride: 97 mmol/L (ref 97–108)
Creatinine, Ser: 0.88 mg/dL (ref 0.57–1.00)
GFR calc Af Amer: 81 mL/min/{1.73_m2} (ref >59)
GFR calc non Af Amer: 71 mL/min/{1.73_m2} (ref >59)
Globulin, Total: 2.3 g/dL (ref 1.5–4.5)
Glucose: 86 mg/dL (ref 65–99)
Potassium: 3.3 mmol/L — ABNORMAL LOW (ref 3.5–5.2)
Sodium: 141 mmol/L (ref 134–144)
Total Bilirubin: 0.3 mg/dL (ref 0.0–1.2)
Total Protein: 6.6 g/dL (ref 6.0–8.5)

## 2013-07-20 LAB — URINE CULTURE

## 2013-07-20 LAB — TSH: TSH: 3.98 u[IU]/mL (ref 0.450–4.500)

## 2013-07-20 LAB — VITAMIN D 25 HYDROXY (VIT D DEFICIENCY, FRACTURES): Vit D, 25-Hydroxy: 32.4 ng/mL (ref 30.0–100.0)

## 2013-07-20 MED ORDER — AMOXICILLIN 875 MG PO TABS: 875.0000 mg | ORAL_TABLET | Freq: Two times a day (BID) | ORAL | Status: DC

## 2013-07-20 NOTE — Progress Notes (Signed)
Quick Note:  Call Patient Labs that are abnormal:  Potassium was low Urine Culture grew Group B strep The rest are at goal  Recommendations: Start on amoxil Rx in [facility]. She should stop the hygroton because it is causing her to lose potassium. Also she is supposed to be on Potassium tablets. Is she taking it? Recheck BMP/Potassium in 1 week.   ______

## 2013-07-22 ENCOUNTER — Telehealth: Payer: Self-pay | Admitting: Family Medicine

## 2013-07-22 ENCOUNTER — Ambulatory Visit: Payer: Medicaid Other | Admitting: Urology

## 2013-07-22 NOTE — Telephone Encounter (Signed)
Notified pt of lab results. Pt verbalized understanding and informed her to call if further questions

## 2013-08-05 ENCOUNTER — Other Ambulatory Visit (INDEPENDENT_AMBULATORY_CARE_PROVIDER_SITE_OTHER): Payer: Medicaid Other

## 2013-08-05 ENCOUNTER — Ambulatory Visit: Payer: Medicaid Other | Admitting: Urology

## 2013-08-05 ENCOUNTER — Telehealth: Payer: Self-pay | Admitting: Family Medicine

## 2013-08-05 DIAGNOSIS — R7989 Other specified abnormal findings of blood chemistry: Secondary | ICD-10-CM

## 2013-08-05 LAB — POCT URINALYSIS DIPSTICK
Bilirubin, UA: NEGATIVE
Blood, UA: NEGATIVE
Glucose, UA: NEGATIVE
Ketones, UA: NEGATIVE
Nitrite, UA: NEGATIVE
Protein, UA: NEGATIVE
Spec Grav, UA: 1.01
Urobilinogen, UA: NEGATIVE
pH, UA: 7.5

## 2013-08-05 LAB — POCT UA - MICROSCOPIC ONLY
Bacteria, U Microscopic: NEGATIVE
Casts, Ur, LPF, POC: NEGATIVE
Crystals, Ur, HPF, POC: NEGATIVE
Mucus, UA: NEGATIVE
RBC, urine, microscopic: NEGATIVE
Yeast, UA: NEGATIVE

## 2013-08-05 NOTE — Progress Notes (Signed)
Pt came in for labs only 

## 2013-08-05 NOTE — Addendum Note (Signed)
Addended by: Prescott GumLAND, Cleofas Hudgins M on: 08/05/2013 01:19 PM   Modules accepted: Orders

## 2013-08-06 LAB — BMP8+EGFR
BUN/Creatinine Ratio: 15 (ref 11–26)
BUN: 13 mg/dL (ref 8–27)
CO2: 23 mmol/L (ref 18–29)
Calcium: 9.4 mg/dL (ref 8.7–10.3)
Chloride: 99 mmol/L (ref 97–108)
Creatinine, Ser: 0.87 mg/dL (ref 0.57–1.00)
GFR calc Af Amer: 83 mL/min/{1.73_m2} (ref >59)
GFR calc non Af Amer: 72 mL/min/{1.73_m2} (ref >59)
Glucose: 84 mg/dL (ref 65–99)
Potassium: 3.6 mmol/L (ref 3.5–5.2)
Sodium: 141 mmol/L (ref 134–144)

## 2013-08-07 ENCOUNTER — Other Ambulatory Visit: Payer: Self-pay | Admitting: Family Medicine

## 2013-08-07 ENCOUNTER — Other Ambulatory Visit: Payer: Self-pay | Admitting: *Deleted

## 2013-08-07 DIAGNOSIS — N390 Urinary tract infection, site not specified: Secondary | ICD-10-CM

## 2013-08-07 LAB — URINE CULTURE

## 2013-08-07 MED ORDER — POTASSIUM CHLORIDE CRYS ER 20 MEQ PO TBCR: 20.0000 meq | EXTENDED_RELEASE_TABLET | Freq: Three times a day (TID) | ORAL | Status: DC

## 2013-08-07 MED ORDER — CYCLOSPORINE 0.05 % OP EMUL: 1.0000 [drp] | Freq: Two times a day (BID) | OPHTHALMIC | Status: DC

## 2013-08-07 MED ORDER — VENLAFAXINE HCL ER 150 MG PO TB24: 1.0000 | ORAL_TABLET | Freq: Every morning | ORAL | Status: DC

## 2013-08-07 MED ORDER — NIACIN ER (ANTIHYPERLIPIDEMIC) 500 MG PO TBCR: 1500.0000 mg | EXTENDED_RELEASE_TABLET | Freq: Every day | ORAL | Status: DC

## 2013-08-07 MED ORDER — CYCLOBENZAPRINE HCL 10 MG PO TABS: 10.0000 mg | ORAL_TABLET | Freq: Three times a day (TID) | ORAL | Status: DC | PRN

## 2013-08-07 NOTE — Telephone Encounter (Signed)
rx refilled.

## 2013-08-07 NOTE — Progress Notes (Signed)
Quick Note:  Call Patient Labs that are abnormal: Urine culture grew 2 different bacteria.  The potassium is at goal  Recommendations: Needs to repeat the urine culture. Needs to be a mid-stream urine.   ______

## 2013-08-07 NOTE — Telephone Encounter (Signed)
Call patient : Prescription refilled & sent to pharmacy in [place]. 

## 2013-08-07 NOTE — Addendum Note (Signed)
Addended by: Tamera PuntWRAY, WENDY S on: 08/07/2013 04:35 PM   Modules accepted: Orders

## 2013-08-08 ENCOUNTER — Telehealth (INDEPENDENT_AMBULATORY_CARE_PROVIDER_SITE_OTHER): Payer: Medicaid Other | Admitting: Family Medicine

## 2013-08-08 DIAGNOSIS — N390 Urinary tract infection, site not specified: Secondary | ICD-10-CM

## 2013-08-08 NOTE — Telephone Encounter (Signed)
She is aware of lab results and need to repeat urine specimen. Viral infections aren't cured by antibiotics.  Ask her to try mucinex,delsym cough meds and tylenol or motrin for comfort.

## 2013-08-08 NOTE — Telephone Encounter (Signed)
Will need to be seen tomorrow

## 2013-08-09 ENCOUNTER — Ambulatory Visit (INDEPENDENT_AMBULATORY_CARE_PROVIDER_SITE_OTHER): Payer: Medicaid Other | Admitting: Family Medicine

## 2013-08-09 ENCOUNTER — Encounter: Payer: Self-pay | Admitting: Family Medicine

## 2013-08-09 VITALS — BP 122/70 | HR 79 | Temp 98.0°F | Ht 64.25 in | Wt 222.2 lb

## 2013-08-09 DIAGNOSIS — J069 Acute upper respiratory infection, unspecified: Secondary | ICD-10-CM

## 2013-08-09 DIAGNOSIS — H6980 Other specified disorders of Eustachian tube, unspecified ear: Secondary | ICD-10-CM

## 2013-08-09 DIAGNOSIS — R350 Frequency of micturition: Secondary | ICD-10-CM

## 2013-08-09 MED ORDER — LEVOFLOXACIN 500 MG PO TABS: 500.0000 mg | ORAL_TABLET | Freq: Every day | ORAL | Status: DC

## 2013-08-09 NOTE — Progress Notes (Signed)
   Subjective:    Patient ID: [name], female    DOB: 06/01/1951, 63 y.o.   MRN: [id number]  HPI This 63 y.o. female presents for evaluation of URI sx's for over a week.  She has been Having urinary frequency and she states she did a ua and cx and it came back with 2  Bacteria in it.  Review of chart reveals that she may have had possible contaminated UA And was supposed to return next week for repeat UA.  She is also c/o left ear discomfort.   Review of Systems C/o uri sx's and left ear discomfort.  She c/o ua frequency   No chest pain, SOB, HA, dizziness, vision change, N/V, diarrhea, constipation, dysuria, urinary urgency or frequency, myalgias, arthralgias or rash.  Objective:   Physical Exam  Vital signs noted  Well developed well nourished female.  HEENT - Head atraumatic Normocephalic                Eyes - PERRLA, Conjuctiva - clear Sclera- Clear EOMI                Ears - EAC's Wnl TM's Wnl Gross Hearing WNL                Throat - oropharanx wnl Respiratory - Lungs CTA bilateral Cardiac - RRR S1 and S2 without murmur GI - Abdomen soft Nontender and bowel sounds active x 4 Extremities - No edema. Neuro - Grossly intact.      Assessment & Plan:  URI (upper respiratory infection) Levaquin 500mg  one po qd x 10 days Push po fluids, rest, tylenol and motrin otc prn as directed for fever, arthralgias, and myalgias.  Follow up prn if sx's continue or persist.  ETD (eustachian tube dysfunction)  Explained this is not ear infection and can be remedied with valsalva maneuver and mucinex  Urinary frequency - Levaquin 500mg  one po qd x 10 days and then repeat UA in 2 weeks.  [name] CanterWilliam J Oxford FNP

## 2013-08-16 ENCOUNTER — Encounter (HOSPITAL_COMMUNITY): Payer: Self-pay | Admitting: Emergency Medicine

## 2013-08-16 ENCOUNTER — Emergency Department (HOSPITAL_COMMUNITY)
Admission: EM | Admit: 2013-08-16 | Discharge: 2013-08-16 | Disposition: A | Discharge status: Home / Self Care | Payer: Medicaid Other | Attending: Emergency Medicine | Admitting: Emergency Medicine

## 2013-08-16 DIAGNOSIS — F329 Major depressive disorder, single episode, unspecified: Secondary | ICD-10-CM | POA: Insufficient documentation

## 2013-08-16 DIAGNOSIS — Z9889 Other specified postprocedural states: Secondary | ICD-10-CM | POA: Insufficient documentation

## 2013-08-16 DIAGNOSIS — E669 Obesity, unspecified: Secondary | ICD-10-CM | POA: Insufficient documentation

## 2013-08-16 DIAGNOSIS — Z792 Long term (current) use of antibiotics: Secondary | ICD-10-CM | POA: Insufficient documentation

## 2013-08-16 DIAGNOSIS — Z79899 Other long term (current) drug therapy: Secondary | ICD-10-CM | POA: Insufficient documentation

## 2013-08-16 DIAGNOSIS — R002 Palpitations: Secondary | ICD-10-CM | POA: Insufficient documentation

## 2013-08-16 DIAGNOSIS — F411 Generalized anxiety disorder: Secondary | ICD-10-CM | POA: Insufficient documentation

## 2013-08-16 DIAGNOSIS — Z7982 Long term (current) use of aspirin: Secondary | ICD-10-CM | POA: Insufficient documentation

## 2013-08-16 DIAGNOSIS — Z8719 Personal history of other diseases of the digestive system: Secondary | ICD-10-CM | POA: Insufficient documentation

## 2013-08-16 DIAGNOSIS — M549 Dorsalgia, unspecified: Secondary | ICD-10-CM | POA: Insufficient documentation

## 2013-08-16 DIAGNOSIS — G8929 Other chronic pain: Secondary | ICD-10-CM | POA: Insufficient documentation

## 2013-08-16 DIAGNOSIS — Z862 Personal history of diseases of the blood and blood-forming organs and certain disorders involving the immune mechanism: Secondary | ICD-10-CM | POA: Insufficient documentation

## 2013-08-16 DIAGNOSIS — F3289 Other specified depressive episodes: Secondary | ICD-10-CM | POA: Insufficient documentation

## 2013-08-16 DIAGNOSIS — M19019 Primary osteoarthritis, unspecified shoulder: Secondary | ICD-10-CM

## 2013-08-16 DIAGNOSIS — Z86718 Personal history of other venous thrombosis and embolism: Secondary | ICD-10-CM | POA: Insufficient documentation

## 2013-08-16 DIAGNOSIS — Z8742 Personal history of other diseases of the female genital tract: Secondary | ICD-10-CM | POA: Insufficient documentation

## 2013-08-16 DIAGNOSIS — E039 Hypothyroidism, unspecified: Secondary | ICD-10-CM | POA: Insufficient documentation

## 2013-08-16 DIAGNOSIS — E785 Hyperlipidemia, unspecified: Secondary | ICD-10-CM | POA: Insufficient documentation

## 2013-08-16 DIAGNOSIS — M542 Cervicalgia: Secondary | ICD-10-CM | POA: Insufficient documentation

## 2013-08-16 HISTORY — DX: Pain in unspecified shoulder: M25.519

## 2013-08-16 HISTORY — DX: Other chronic pain: G89.29

## 2013-08-16 MED ORDER — PREDNISONE 10 MG PO TABS: ORAL_TABLET | ORAL | Status: DC

## 2013-08-16 MED ORDER — MELOXICAM 7.5 MG PO TABS: ORAL_TABLET | ORAL | Status: DC

## 2013-08-16 MED ORDER — IBUPROFEN 800 MG PO TABS
800.0000 mg | ORAL_TABLET | Freq: Once | ORAL | Status: AC
  Administered 2013-08-16: 800 mg via ORAL
  Filled 2013-08-16: qty 1

## 2013-08-16 MED ORDER — PREDNISONE 10 MG PO TABS
60.0000 mg | ORAL_TABLET | Freq: Once | ORAL | Status: AC
Start: 2013-08-16 — End: 2013-08-16
  Administered 2013-08-16: 60 mg via ORAL
  Filled 2013-08-16 (×2): qty 1

## 2013-08-16 MED ORDER — TRAMADOL HCL 50 MG PO TABS
50.0000 mg | ORAL_TABLET | Freq: Once | ORAL | Status: DC
  Filled 2013-08-16: qty 1

## 2013-08-16 MED ORDER — TRAMADOL HCL 50 MG PO TABS: 50.0000 mg | ORAL_TABLET | Freq: Four times a day (QID) | ORAL | Status: DC | PRN

## 2013-08-16 MED ORDER — ONDANSETRON HCL 4 MG PO TABS
4.0000 mg | ORAL_TABLET | Freq: Once | ORAL | Status: AC
  Administered 2013-08-16: 4 mg via ORAL
  Filled 2013-08-16: qty 1

## 2013-08-16 NOTE — ED Provider Notes (Signed)
CSN: [id number]     Arrival date & time 08/16/13  1351 History   First MD Initiated Contact with Patient 08/16/13 1613     Chief Complaint  Patient presents with  . Shoulder Pain     (Consider location/radiation/quality/duration/timing/severity/associated sxs/prior Treatment) HPI Comments: Patient is a 63 year old female who presents to the emergency department with bilateral shoulder and arm pain. The patient states that she's been having this problem off and on for more than a month. Approximately a month ago she was seen by a" specialist" and was told that she had a bursitis and arthritis. At that time the pain was mostly in the right shoulder. It has since moved to  both  shoulders and both armspatient states she was seen by her primary physician own Tuesday, March 3. She was given an injection of steroid, states this has not helped at all. She actually feels that the pain is getting worse. The pain is not associated with any sweats, shortness of breath, nausea, jaw pain, fatigue, or loss of consciousness. She has not had any new injury or trauma to the shoulder areas..   Patient is a 63 y.o. female presenting with shoulder pain. The history is provided by the patient.  Shoulder Pain Associated symptoms include arthralgias, myalgias and neck pain. Pertinent negatives include no abdominal pain, chest pain or coughing.    Past Medical History  Diagnosis Date  . Borderline hypertension   . Obesity   . Hyperlipidemia   . Fe deficiency anemia   . Hiatal hernia   . PSVT (paroxysmal supraventricular tachycardia) 8/05  . Recurrent cystitis     Dr. Aldean Ast   . Chronic lower back pain   . Lumbar herniated disc     L4-L5   . History of mixed drug abuse   . Anxiety   . Arthritis   . Depression   . History of cardiac catheterization     Normal coronaries  . Heartburn   . Microscopic hematuria   . Personal history of venous thrombosis and embolism   . Hypothyroidism     [name]   .  Chronic shoulder pain    Past Surgical History  Procedure Laterality Date  . Appendectomy  2004?or 11/03?  [name] Partial hysterectomy  2004    One ovary   . Right knee arthroscopy  8/05  . Right knee replacement  8/05  . Carpal tunnel release      x2   . Shoulder surgery    . Rectocele repair  05/2001  . Liver biopsy  2002    Dr. Katrinka Blazing   . Bladder repair    . Rectal prolapse repair    . Back surgery     Family History  Problem Relation Age of Onset  . Heart attack      Family history   . Coronary artery disease      Family History   . Breast cancer Mother   . Breast cancer Sister    History  Substance Use Topics  . Smoking status: Never Smoker   . Smokeless tobacco: Never Used  . Alcohol Use: No   OB History   Grav Para Term Preterm Abortions TAB SAB Ect Mult Living   6 5 5  0 1 0 1 0 0 5     Review of Systems  Constitutional: Negative for activity change.       All ROS Neg except as noted in HPI  HENT: Negative for nosebleeds.   Eyes:  Negative for photophobia and discharge.  Respiratory: Negative for cough, shortness of breath and wheezing.   Cardiovascular: Positive for palpitations. Negative for chest pain.  Gastrointestinal: Negative for abdominal pain and blood in stool.  Genitourinary: Negative for dysuria, frequency and hematuria.  Musculoskeletal: Positive for arthralgias, back pain, myalgias, neck pain and neck stiffness.  Skin: Negative.   Neurological: Negative for dizziness, seizures and speech difficulty.  Psychiatric/Behavioral: Negative for hallucinations and confusion. The patient is nervous/anxious.        Depression      Allergies  Clindamycin and Diphenhydramine hcl  Home Medications   Current Outpatient Rx  Name  Route  Sig  Dispense  Refill  . aspirin EC 81 MG tablet      Take by mouth. Take 81 mg by mouth daily.         . Calcium Carbonate-Vitamin D (CALCIUM 500 + D) 500-125 MG-UNIT TABS      Take by mouth. Take 1 tablet by  mouth every morning.         . Cholecalciferol (VITAMIN D-3 PO)   Oral   Take 1 capsule by mouth every morning.          . clonazePAM (KLONOPIN) 2 MG tablet   Oral   Take 1 tablet (2 mg total) by mouth 2 (two) times daily as needed for anxiety.   60 tablet   2   . cyclobenzaprine (FLEXERIL) 10 MG tablet   Oral   Take 1 tablet (10 mg total) by mouth 3 (three) times daily as needed. For muscle spasms   90 tablet   2   . cycloSPORINE (RESTASIS) 0.05 % ophthalmic emulsion   Both Eyes   Place 1 drop into both eyes 2 (two) times daily. Apply to eye. Place 1 drop into both eyes 2 (two) times daily.   0.4 mL   2   . estradiol (ESTRACE) 0.1 MG/GM vaginal cream      Place vaginally. Place 2 g vaginally daily.         [name]. ezetimibe-simvastatin (VYTORIN) 10-40 MG per tablet   Oral   Take 1 tablet by mouth at bedtime.   30 tablet   4   . levofloxacin (LEVAQUIN) 500 MG tablet   Oral   Take 1 tablet (500 mg total) by mouth daily.   10 tablet   0   . levothyroxine (SYNTHROID, LEVOTHROID) 50 MCG tablet   Oral   Take 1 tablet (50 mcg total) by mouth daily before breakfast.   30 tablet   6   . lubiprostone (AMITIZA) 24 MCG capsule   Oral   Take 1 capsule (24 mcg total) by mouth 2 (two) times daily with a meal.   60 capsule   2   . metoprolol succinate (TOPROL-XL) 50 MG 24 hr tablet   Oral   Take 1 tablet (50 mg total) by mouth 2 (two) times daily. Take with or immediately following a meal.   60 tablet   11   . niacin (NIASPAN) 500 MG CR tablet   Oral   Take 3 tablets (1,500 mg total) by mouth at bedtime.   90 tablet   3   . omega-3 acid ethyl esters (LOVAZA) 1 G capsule   Oral   Take 1 g by mouth 2 (two) times daily.         [name]. omeprazole (PRILOSEC) 20 MG capsule   Oral   Take 1 capsule (20 mg total) by mouth 2 (  two) times daily.   60 capsule   5   . potassium chloride SA (K-DUR,KLOR-CON) 20 MEQ tablet   Oral   Take 1 tablet (20 mEq total) by mouth 3  (three) times daily.   90 tablet   2   . traMADol (ULTRAM) 50 MG tablet   Oral   Take 50 mg by mouth every 6 (six) hours as needed (GUILDRORD ORTHROPEDICS).         [name] triamterene-hydrochlorothiazide (DYAZIDE) 37.5-25 MG per capsule   Oral   Take 1 each (1 capsule total) by mouth every morning.   30 capsule   2   . Venlafaxine HCl 150 MG TB24   Oral   Take 1 tablet (150 mg total) by mouth every morning.   30 each   2    BP 125/84  Pulse 77  Temp(Src) 97.8 F (36.6 C) (Oral)  Resp 18  Ht 5\' 5"  (1.651 m)  Wt 220 lb (99.791 kg)  BMI 36.61 kg/m2  SpO2 97% Physical Exam  Nursing note and vitals reviewed. Constitutional: She is oriented to person, place, and time. She appears well-developed and well-nourished.  Non-toxic appearance.  HENT:  Head: Normocephalic.  Right Ear: Tympanic membrane and external ear normal.  Left Ear: Tympanic membrane and external ear normal.  Eyes: EOM and lids are normal. Pupils are equal, round, and reactive to light.  Neck: Normal range of motion. Neck supple. Carotid bruit is not present.  Cardiovascular: Normal rate, regular rhythm, normal heart sounds, intact distal pulses and normal pulses.   Patient has a normal sinus rhythm by auscultation. No rub or gallop appreciated. No jugular venous distention noted on examination.  Pulmonary/Chest: Breath sounds normal. No respiratory distress.  Abdominal: Soft. Bowel sounds are normal. There is no tenderness. There is no guarding.  Musculoskeletal: She exhibits no edema.  There is mild soreness with range of motion of the right and left shoulder. There is pain in the a.c. joint area on the right. No hot joints on the right or the left. There is pain at the upper trapezius on the right and the left. There is some crepitus with range of motion noted. There is full range of motion of both elbows and wrists. Capillary refill is less than 2 seconds. Radial pulses are 2+.  Lymphadenopathy:       Head  (right side): No submandibular adenopathy present.       Head (left side): No submandibular adenopathy present.    She has no cervical adenopathy.  Neurological: She is alert and oriented to person, place, and time. She has normal strength. No cranial nerve deficit or sensory deficit.  Skin: Skin is warm and dry.  Psychiatric: She has a normal mood and affect. Her speech is normal.    ED Course  Procedures (including critical care time) Labs Review Labs Reviewed - No data to display Imaging Review No results found.   EKG Interpretation None      MDM Patient presents to the emergency department with bilateral shoulder and arm pain. She has a history of chronic shoulder pain. She was given a steroid injection in the right shoulder recently, but states it has not helped. No evidence of a hot joint. No dislocation noted.  I have reviewed the previous shoulder x-rays today are consistent with degenerative joint disease, and widening of the a.c. joint on the right.  Plan at this time is for the patient be placed on Ultram, prednisone taper, and Mobic. Patient  is to follow with primary physician for additional evaluation.    Final diagnoses:  None    **I have reviewed nursing notes, vital signs, and all appropriate lab and imaging results for this patient.Kathie Dike, PA-C 08/16/13 1706

## 2013-08-16 NOTE — ED Notes (Signed)
Patient c/o bilateral shoulder and arm pain. Per patient was sent to specialist a month ago for shoulder pain and was given a steroid shot that didn't help. Patient reports going to PCP and given medication this week but unsure of the name. Patient was told to come here for blood work by nurse.

## 2013-08-16 NOTE — Discharge Instructions (Signed)
I have reviewed your x-rays, which revealed degenerative changes. Your electrocardiogram is negative for acute event. Please apply heat to your shoulders and upper back, please use Toradol every 6 hours for pain. Please use prednisone and Mobic daily until all taken. Please take these with food. Please see your primary physician for additional evaluation and management of this pain. Osteoarthritis Osteoarthritis is a disease that causes soreness and swelling (inflammation) of a joint. It occurs when the cartilage at the affected joint wears down. Cartilage acts as a cushion, covering the ends of bones where they meet to form a joint. Osteoarthritis is the most common form of arthritis. It often occurs in older people. The joints affected most often by this condition include those in the:  Ends of the fingers.  Thumbs.  Neck.  Lower back.  Knees.  Hips. CAUSES  Over time, the cartilage that covers the ends of bones begins to wear away. This causes bone to rub on bone, producing pain and stiffness in the affected joints.  RISK FACTORS Certain factors can increase your chances of having osteoarthritis, including:  Older age.  Excessive body weight.  Overuse of joints. SIGNS AND SYMPTOMS   Pain, swelling, and stiffness in the joint.  Over time, the joint may lose its normal shape.  Small deposits of bone (osteophytes) may grow on the edges of the joint.  Bits of bone or cartilage can break off and float inside the joint space. This may cause more pain and damage. DIAGNOSIS  Your health care provider will do a physical exam and ask about your symptoms. Various tests may be ordered, such as:  X-rays of the affected joint.  An MRI scan.  Blood tests to rule out other types of arthritis.  Joint fluid tests. This involves using a needle to draw fluid from the joint and examining the fluid under a microscope. TREATMENT  Goals of treatment are to control pain and improve joint  function. Treatment plans may include:  A prescribed exercise program that allows for rest and joint relief.  A weight control plan.  Pain relief techniques, such as:  Properly applied heat and cold.  Electric pulses delivered to nerve endings under the skin (transcutaneous electrical nerve stimulation, TENS).  Massage.  Certain nutritional supplements.  Medicines to control pain, such as:  Acetaminophen.  Nonsteroidal anti-inflammatory drugs (NSAIDs), such as naproxen.  Narcotic or central-acting agents, such as tramadol.  Corticosteroids. These can be given orally or as an injection.  Surgery to reposition the bones and relieve pain (osteotomy) or to remove loose pieces of bone and cartilage. Joint replacement may be needed in advanced states of osteoarthritis. HOME CARE INSTRUCTIONS   Only take over-the-counter or prescription medicines as directed by your health care provider. Take all medicines exactly as instructed.  Maintain a healthy weight. Follow your health care provider's instructions for weight control. This may include dietary instructions.  Exercise as directed. Your health care provider can recommend specific types of exercise. These may include:  Strengthening exercises These are done to strengthen the muscles that support joints affected by arthritis. They can be performed with weights or with exercise bands to add resistance.  Aerobic activities These are exercises, such as brisk walking or low-impact aerobics, that get your heart pumping.  Range-of-motion activities These keep your joints limber.  Balance and agility exercises These help you maintain daily living skills.  Rest your affected joints as directed by your health care provider.  Follow up with your health  care provider as directed. SEEK MEDICAL CARE IF:   Your skin turns red.  You develop a rash in addition to your joint pain.  You have worsening joint pain. SEEK IMMEDIATE MEDICAL  CARE IF:  You have a significant loss of weight or appetite.  You have a fever along with joint or muscle aches.  You have night sweats. FOR MORE INFORMATION  National Institute of Arthritis and Musculoskeletal and Skin Diseases: www.niams.http://www.myers.net/ [employer] on Aging: https://walker.com/ American College of Rheumatology: www.rheumatology.org Document Released: 05/29/2005 Document Revised: 03/19/2013 Document Reviewed: 02/03/2013 [facility] Patient Information 2014 [place], [place].

## 2013-08-17 NOTE — ED Provider Notes (Signed)
Medical screening examination/treatment/procedure(s) were performed by non-physician practitioner and as supervising physician I was immediately available for consultation/collaboration.   Rana Adorno M Antonae Zbikowski, MD 08/17/13 1335 

## 2013-08-25 ENCOUNTER — Telehealth: Payer: Self-pay | Admitting: Family Medicine

## 2013-08-25 ENCOUNTER — Other Ambulatory Visit: Payer: Self-pay | Admitting: Family Medicine

## 2013-08-25 NOTE — Telephone Encounter (Signed)
pT HAD ABD PAIN ON Saturday BUT NONE NOW.

## 2013-08-26 ENCOUNTER — Encounter: Payer: Self-pay | Admitting: General Practice

## 2013-08-26 ENCOUNTER — Ambulatory Visit (INDEPENDENT_AMBULATORY_CARE_PROVIDER_SITE_OTHER): Payer: Medicaid Other | Admitting: General Practice

## 2013-08-26 ENCOUNTER — Ambulatory Visit (INDEPENDENT_AMBULATORY_CARE_PROVIDER_SITE_OTHER): Payer: Medicaid Other

## 2013-08-26 VITALS — BP 129/66 | HR 90 | Temp 97.9°F | Ht 65.0 in | Wt 216.6 lb

## 2013-08-26 DIAGNOSIS — R109 Unspecified abdominal pain: Secondary | ICD-10-CM

## 2013-08-26 DIAGNOSIS — R102 Pelvic and perineal pain: Secondary | ICD-10-CM

## 2013-08-26 LAB — POCT UA - MICROSCOPIC ONLY
Bacteria, U Microscopic: NEGATIVE
CASTS, UR, LPF, POC: NEGATIVE
Crystals, Ur, HPF, POC: NEGATIVE
Mucus, UA: NEGATIVE
RBC, urine, microscopic: NEGATIVE
YEAST UA: NEGATIVE

## 2013-08-26 LAB — POCT URINALYSIS DIPSTICK
BILIRUBIN UA: NEGATIVE
Bilirubin, UA: NEGATIVE
Blood, UA: NEGATIVE
Blood, UA: NEGATIVE
GLUCOSE UA: NEGATIVE
Glucose, UA: NEGATIVE
Ketones, UA: NEGATIVE
Ketones, UA: NEGATIVE
Nitrite, UA: NEGATIVE
Nitrite, UA: NEGATIVE
PH UA: 6
Protein, UA: NEGATIVE
Protein, UA: NEGATIVE
Spec Grav, UA: 1.02
Spec Grav, UA: 1.02
UROBILINOGEN UA: NEGATIVE
Urobilinogen, UA: NEGATIVE
pH, UA: 6

## 2013-08-26 NOTE — Progress Notes (Signed)
   Subjective:    Patient ID: [name], female    DOB: 02/12/1951, 63 y.o.   MRN: [id number]  Abdominal Pain This is a new problem. The current episode started in the past 7 days. The onset quality is sudden. The problem occurs intermittently. The problem has been gradually worsening. The pain is located in the suprapubic region. The pain is at a severity of 0/10. The patient is experiencing no pain. The abdominal pain does not radiate. Pertinent negatives include no constipation, diarrhea, dysuria, fever, frequency, headaches, hematuria, nausea or vomiting. Nothing aggravates the pain. She has tried nothing for the symptoms. Her past medical history is significant for GERD. There is no history of gallstones.  Patient denies having any abdominal pain since Saturday.     Review of Systems  Constitutional: Negative for fever and chills.  Respiratory: Negative for chest tightness and shortness of breath.   Cardiovascular: Negative for chest pain and palpitations.  Gastrointestinal: Negative for nausea, vomiting, abdominal pain, diarrhea, constipation and blood in stool.  Genitourinary: Negative for dysuria, frequency, hematuria, vaginal discharge and difficulty urinating.  Neurological: Negative for dizziness, weakness and headaches.       Objective:   Physical Exam  Constitutional: She is oriented to person, place, and time. She appears well-developed and well-nourished.  Cardiovascular: Normal rate, regular rhythm and normal heart sounds.   Pulmonary/Chest: Effort normal and breath sounds normal. No respiratory distress. She exhibits no tenderness.  Abdominal: Soft. Bowel sounds are normal. She exhibits no distension. There is no tenderness.  Neurological: She is alert and oriented to person, place, and time.  Skin: Skin is warm and dry.  Psychiatric: She has a normal mood and affect.    [facility] reading (PRIMARY) by Coralie KeensMae E. Shannin Naab, FNP-C, no acute changes.   Results for orders  placed in visit on 08/26/13  POCT UA - MICROSCOPIC ONLY      Result Value Ref Range   WBC, Ur, HPF, POC 5-10     RBC, urine, microscopic neg     Bacteria, U Microscopic neg     Mucus, UA neg     Epithelial cells, urine per micros occ     Crystals, Ur, HPF, POC neg     Casts, Ur, LPF, POC neg     Yeast, UA neg    POCT URINALYSIS DIPSTICK      Result Value Ref Range   Color, UA yellow     Clarity, UA clear     Glucose, UA neg     Bilirubin, UA neg     Ketones, UA neg     Spec Grav, UA 1.020     Blood, UA neg     pH, UA 6.0     Protein, UA neg     Urobilinogen, UA negative     Nitrite, UA neg     Leukocytes, UA Trace          Assessment & Plan:  1. Suprapubic abdominal pain - POCT UA - Microscopic Only - POCT urinalysis dipstick - DG Abd 1 View; Future -discussed drinking adequate fluids (refrain from caffeine) -RTO if symptoms return May seek emergency medical treatment Patient verbalized understanding Coralie KeensMae E. Alezandra Egli, FNP-C

## 2013-08-27 NOTE — Telephone Encounter (Signed)
Last lipid 10/14. 

## 2013-08-31 NOTE — Telephone Encounter (Signed)
Quick Note:  Call patient. Labs normal. No change in plan. ______ 

## 2013-09-02 ENCOUNTER — Other Ambulatory Visit: Payer: Self-pay | Admitting: Family Medicine

## 2013-09-02 ENCOUNTER — Other Ambulatory Visit: Payer: Self-pay | Admitting: Nurse Practitioner

## 2013-09-04 NOTE — Telephone Encounter (Signed)
Phoned in and left on [facility]'s voicemail.

## 2013-09-04 NOTE — Telephone Encounter (Signed)
Rx ready for nurse to Phone in. 

## 2013-09-19 ENCOUNTER — Encounter: Payer: Self-pay | Admitting: *Deleted

## 2013-10-02 ENCOUNTER — Other Ambulatory Visit: Payer: Self-pay | Admitting: Family Medicine

## 2013-10-08 ENCOUNTER — Other Ambulatory Visit: Payer: Self-pay | Admitting: *Deleted

## 2013-10-08 NOTE — Telephone Encounter (Signed)
Patient needs to be seen. Patient has exceeded limit since last visit. Refill denied. Bring all medications at next office visit. 

## 2013-10-08 NOTE — Telephone Encounter (Signed)
This med is not on pt epic med list. Received refill request from [facility]. Last refill 09/02/13.

## 2013-10-13 ENCOUNTER — Ambulatory Visit (INDEPENDENT_AMBULATORY_CARE_PROVIDER_SITE_OTHER): Payer: Medicaid Other | Admitting: Family Medicine

## 2013-10-13 ENCOUNTER — Other Ambulatory Visit: Payer: Self-pay | Admitting: Family Medicine

## 2013-10-13 ENCOUNTER — Encounter: Payer: Self-pay | Admitting: Family Medicine

## 2013-10-13 VITALS — BP 114/64 | HR 70 | Temp 97.2°F | Ht 65.0 in | Wt 216.4 lb

## 2013-10-13 DIAGNOSIS — I471 Supraventricular tachycardia, unspecified: Secondary | ICD-10-CM

## 2013-10-13 DIAGNOSIS — E559 Vitamin D deficiency, unspecified: Secondary | ICD-10-CM

## 2013-10-13 DIAGNOSIS — N390 Urinary tract infection, site not specified: Secondary | ICD-10-CM | POA: Insufficient documentation

## 2013-10-13 DIAGNOSIS — E785 Hyperlipidemia, unspecified: Secondary | ICD-10-CM

## 2013-10-13 DIAGNOSIS — I10 Essential (primary) hypertension: Secondary | ICD-10-CM

## 2013-10-13 DIAGNOSIS — F4321 Adjustment disorder with depressed mood: Secondary | ICD-10-CM

## 2013-10-13 DIAGNOSIS — E039 Hypothyroidism, unspecified: Secondary | ICD-10-CM

## 2013-10-13 NOTE — Progress Notes (Signed)
Patient ID: [name], female   DOB: Mar 01, 1951, 63 y.o.   MRN: [id number] SUBJECTIVE: CC: Chief Complaint  Patient presents with  . Hypertension    3 month recheck  . Hyperlipidemia  . Hypothyroidism    HPI: Patient is here for follow up of hyperlipidemia/HTN/Hypothyroidism: denies Headache;denies Chest Pain;denies weakness;denies Shortness of Breath and orthopnea;denies Visual changes;denies palpitations;denies cough;denies pedal edema;denies symptoms of TIA or stroke;deniesClaudication symptoms. admits to Compliance with medications; denies Problems with medications.  Still grieving about family losses.   Past Medical History  Diagnosis Date  . Borderline hypertension   . Obesity   . Hyperlipidemia   . Fe deficiency anemia   . Hiatal hernia   . PSVT (paroxysmal supraventricular tachycardia) 8/05  . Recurrent cystitis     Dr. Serita Butcher   . Chronic lower back pain   . Lumbar herniated disc     L4-L5   . History of mixed drug abuse   . Anxiety   . Arthritis   . Depression   . History of cardiac catheterization     Normal coronaries  . Heartburn   . Microscopic hematuria   . Personal history of venous thrombosis and embolism   . Hypothyroidism     Dr. Derrill Memo   . Chronic shoulder pain    Past Surgical History  Procedure Laterality Date  . Appendectomy  2004?or 11/03?  [name] Partial hysterectomy  2004    One ovary   . Right knee arthroscopy  8/05  . Right knee replacement  8/05  . Carpal tunnel release      x2   . Shoulder surgery    . Rectocele repair  05/2001  . Liver biopsy  2002    Dr. Tamala Julian   . Bladder repair    . Rectal prolapse repair    . Back surgery     History   Social History  . Marital Status: Single    Spouse Name: N/A    Number of Children: 5  . Years of Education: N/A   Occupational History  . Disabled    .     Social History Main Topics  . Smoking status: Never Smoker   . Smokeless tobacco: Never Used  . Alcohol Use: No  . Drug  Use: No     Comment: history of abuse   . Sexual Activity: Not on file   Other Topics Concern  . Not on file   Social History Narrative  . No narrative on file   Family History  Problem Relation Age of Onset  . Heart attack      Family history   . Coronary artery disease      Family History   . Breast cancer Mother   . Breast cancer Sister    Current Outpatient Prescriptions on File Prior to Visit  Medication Sig Dispense Refill  . AMITIZA 24 MCG capsule TAKE 1 CAPSULE TWICE A DAY WITH FOOD  60 capsule  3  . aspirin EC 81 MG tablet Take by mouth. Take 81 mg by mouth daily.      . Calcium Carbonate-Vitamin D (CALCIUM 500 + D) 500-125 MG-UNIT TABS Take by mouth. Take 1 tablet by mouth every morning.      . chlorthalidone (HYGROTON) 25 MG tablet TAKE ONE TABLET BY MOUTH TWICE DAILY  60 tablet  2  . Cholecalciferol (VITAMIN D-3 PO) Take 1 capsule by mouth every morning.       . clonazePAM (KLONOPIN) 2 MG tablet  TAKE ONE TABLET TWICE DAILY AS NEEDED FOR ANXIETY  60 tablet  0  . cyclobenzaprine (FLEXERIL) 10 MG tablet Take 1 tablet (10 mg total) by mouth 3 (three) times daily as needed. For muscle spasms  90 tablet  2  . cycloSPORINE (RESTASIS) 0.05 % ophthalmic emulsion Place 1 drop into both eyes 2 (two) times daily. Apply to eye. Place 1 drop into both eyes 2 (two) times daily.  0.4 mL  2  . estradiol (ESTRACE) 0.1 MG/GM vaginal cream Place vaginally. Place 2 g vaginally daily.      [name] ezetimibe-simvastatin (VYTORIN) 10-40 MG per tablet Take 1 tablet by mouth at bedtime.  30 tablet  4  . levothyroxine (SYNTHROID, LEVOTHROID) 50 MCG tablet TAKE ONE TABLET EVERY MORNING  30 tablet  6  . metoprolol succinate (TOPROL-XL) 50 MG 24 hr tablet Take 1 tablet (50 mg total) by mouth 2 (two) times daily. Take with or immediately following a meal.  60 tablet  11  . niacin (NIASPAN) 500 MG CR tablet Take 3 tablets (1,500 mg total) by mouth at bedtime.  90 tablet  3  . omega-3 acid ethyl esters  (LOVAZA) 1 G capsule Take 1 g by mouth 2 (two) times daily.      [name] omeprazole (PRILOSEC) 20 MG capsule Take 1 capsule (20 mg total) by mouth 2 (two) times daily.  60 capsule  5  . potassium chloride SA (K-DUR,KLOR-CON) 20 MEQ tablet Take 1 tablet (20 mEq total) by mouth 3 (three) times daily.  90 tablet  2  . Venlafaxine HCl 150 MG TB24 Take 1 tablet (150 mg total) by mouth every morning.  30 each  2  . triamterene-hydrochlorothiazide (MAXZIDE-25) 37.5-25 MG per tablet TAKE ONE TABLET EVERY MORNING  30 tablet  3   No current facility-administered medications on file prior to visit.   Allergies  Allergen Reactions  . Clindamycin     REACTION: caused C-diff per patient  . Diphenhydramine Hcl Other (See Comments)    REACTION:  Severely drowsy  . Naproxen     Burning in lower abdominal area.   Immunization History  Administered Date(s) Administered  . Influenza-Unspecified 03/06/2013   Prior to Admission medications   Medication Sig Start Date End Date Taking? Authorizing Provider  AMITIZA 24 MCG capsule TAKE 1 CAPSULE TWICE A DAY WITH FOOD 10/02/13  Yes Lysbeth Penner, FNP  aspirin EC 81 MG tablet Take by mouth. Take 81 mg by mouth daily.   Yes Historical Provider, MD  Calcium Carbonate-Vitamin D (CALCIUM 500 + D) 500-125 MG-UNIT TABS Take by mouth. Take 1 tablet by mouth every morning.   Yes Historical Provider, MD  chlorthalidone (HYGROTON) 25 MG tablet TAKE ONE TABLET BY MOUTH TWICE DAILY   Yes Vernie Shanks, MD  Cholecalciferol (VITAMIN D-3 PO) Take 1 capsule by mouth every morning.    Yes Historical Provider, MD  clonazePAM (KLONOPIN) 2 MG tablet TAKE ONE TABLET TWICE DAILY AS NEEDED FOR ANXIETY   Yes Vernie Shanks, MD  cyclobenzaprine (FLEXERIL) 10 MG tablet Take 1 tablet (10 mg total) by mouth 3 (three) times daily as needed. For muscle spasms 08/07/13  Yes Vernie Shanks, MD  cycloSPORINE (RESTASIS) 0.05 % ophthalmic emulsion Place 1 drop into both eyes 2 (two) times daily. Apply  to eye. Place 1 drop into both eyes 2 (two) times daily. 08/07/13  Yes Vernie Shanks, MD  estradiol (ESTRACE) 0.1 MG/GM vaginal cream Place vaginally. Place 2 g  vaginally daily.   Yes Historical Provider, MD  ezetimibe-simvastatin (VYTORIN) 10-40 MG per tablet Take 1 tablet by mouth at bedtime. 01/29/13  Yes Mary-Margaret Hassell Done, FNP  levothyroxine (SYNTHROID, LEVOTHROID) 50 MCG tablet TAKE ONE TABLET EVERY MORNING   Yes Vernie Shanks, MD  metoprolol succinate (TOPROL-XL) 50 MG 24 hr tablet Take 1 tablet (50 mg total) by mouth 2 (two) times daily. Take with or immediately following a meal. 12/11/12  Yes Donney Dice, PA-C  niacin (NIASPAN) 500 MG CR tablet Take 3 tablets (1,500 mg total) by mouth at bedtime. 08/07/13  Yes Vernie Shanks, MD  omega-3 acid ethyl esters (LOVAZA) 1 G capsule Take 1 g by mouth 2 (two) times daily.   Yes Historical Provider, MD  omeprazole (PRILOSEC) 20 MG capsule Take 1 capsule (20 mg total) by mouth 2 (two) times daily. 07/09/13  Yes Vernie Shanks, MD  potassium chloride SA (K-DUR,KLOR-CON) 20 MEQ tablet Take 1 tablet (20 mEq total) by mouth 3 (three) times daily. 08/07/13  Yes Vernie Shanks, MD  Venlafaxine HCl 150 MG TB24 Take 1 tablet (150 mg total) by mouth every morning. 08/07/13  Yes Vernie Shanks, MD  triamterene-hydrochlorothiazide [facility]) 37.5-25 MG per tablet TAKE ONE TABLET EVERY MORNING 10/02/13   Lysbeth Penner, FNP     ROS: As above in the HPI. All other systems are stable or negative.  OBJECTIVE: APPEARANCE:  Patient in no acute distress.The patient appeared well nourished and normally developed. Acyanotic. Waist: VITAL SIGNS:BP 114/64  Pulse 70  Temp(Src) 97.2 F (36.2 C) (Oral)  Ht 5' 5"  (1.651 m)  Wt 216 lb 6.4 oz (98.158 kg)  BMI 36.01 kg/m2  Obese WF SKIN: warm and  Dry without overt rashes, tattoos and scars  HEAD and Neck: without JVD, Head and scalp: normal Eyes:No scleral icterus. Fundi normal, eye movements normal. Ears:  Auricle normal, canal normal, Tympanic membranes normal, insufflation normal. Nose: normal Throat: normal Neck & thyroid: normal  CHEST & LUNGS: Chest wall: normal Lungs: Clear  CVS: Reveals the PMI to be normally located. Regular rhythm, First and Second Heart sounds are normal,  absence of murmurs, rubs or gallops. Peripheral vasculature: Radial pulses: normal Dorsal pedis pulses: normal Posterior pulses: normal  ABDOMEN:  Appearance: normal Benign, no organomegaly, no masses, no Abdominal Aortic enlargement. No Guarding , no rebound. No Bruits. Bowel sounds: normal  RECTAL: N/A GU: N/A  EXTREMETIES: nonedematous.  MUSCULOSKELETAL:  Spine: normal Joints: intact  NEUROLOGIC: oriented to time,place and person; nonfocal. Strength is normal Sensory is normal Reflexes are normal Cranial Nerves are normal.  Results for orders placed in visit on 08/26/13  POCT UA - MICROSCOPIC ONLY      Result Value Ref Range   WBC, Ur, HPF, POC 5-10     RBC, urine, microscopic neg     Bacteria, U Microscopic neg     Mucus, UA neg     Epithelial cells, urine per micros occ     Crystals, Ur, HPF, POC neg     Casts, Ur, LPF, POC neg     Yeast, UA neg    POCT URINALYSIS DIPSTICK      Result Value Ref Range   Color, UA yellow     Clarity, UA clear     Glucose, UA neg     Bilirubin, UA neg     Ketones, UA neg     Spec Grav, UA 1.020     Blood, UA neg  pH, UA 6.0     Protein, UA neg     Urobilinogen, UA negative     Nitrite, UA neg     Leukocytes, UA Trace      ASSESSMENT: HYPERLIPIDEMIA-MIXED - Plan: CMP14+EGFR, Lipid panel  Essential hypertension, benign - Plan: CMP14+EGFR  Unspecified vitamin D deficiency - Plan: Vit D  25 hydroxy (rtn osteoporosis monitoring)  PSVT (paroxysmal supraventricular tachycardia)  Grieving  Hypothyroidism  UTI (urinary tract infection) - Plan: Urine culture F/U on UTI   PLAN: DASH diet in the AVS  Stress  management. Supportive therapy.  Patient doesn't feel like they need to see a therapist or hospice for grief counselling.  Orders Placed This Encounter  Procedures  . Urine culture  . CMP14+EGFR  . Lipid panel  . Vit D  25 hydroxy (rtn osteoporosis monitoring)   No orders of the defined types were placed in this encounter.   Medications Discontinued During This Encounter  Medication Reason  . levofloxacin (LEVAQUIN) 500 MG tablet Completed Course  . meloxicam (MOBIC) 7.5 MG tablet Completed Course  . VYTORIN 10-40 MG per tablet Duplicate   Return in about 3 months (around 01/13/2014) for Recheck medical problems.  Shakiyah Cirilo P. Jacelyn Grip, [name]

## 2013-10-13 NOTE — Patient Instructions (Signed)
DASH Diet  The DASH diet stands for "Dietary Approaches to Stop Hypertension." It is a healthy eating plan that has been shown to reduce high blood pressure (hypertension) in as little as 14 days, while also possibly providing other significant health benefits. These other health benefits include reducing the risk of breast cancer after menopause and reducing the risk of type 2 diabetes, heart disease, colon cancer, and stroke. Health benefits also include weight loss and slowing kidney failure in patients with chronic kidney disease.   DIET GUIDELINES  · Limit salt (sodium). Your diet should contain less than 1500 mg of sodium daily.  · Limit refined or processed carbohydrates. Your diet should include mostly whole grains. Desserts and added sugars should be used sparingly.  · Include small amounts of heart-healthy fats. These types of fats include nuts, oils, and tub margarine. Limit saturated and trans fats. These fats have been shown to be harmful in the body.  CHOOSING FOODS   The following food groups are based on a 2000 calorie diet. See your Registered Dietitian for individual calorie needs.  Grains and Grain Products (6 to 8 servings daily)  · Eat More Often: Whole-wheat bread, brown rice, whole-grain or wheat pasta, quinoa, popcorn without added fat or salt (air popped).  · Eat Less Often: White bread, white pasta, white rice, cornbread.  Vegetables (4 to 5 servings daily)  · Eat More Often: Fresh, frozen, and canned vegetables. Vegetables may be raw, steamed, roasted, or grilled with a minimal amount of fat.  · Eat Less Often/Avoid: Creamed or fried vegetables. Vegetables in a cheese sauce.  Fruit (4 to 5 servings daily)  · Eat More Often: All fresh, canned (in natural juice), or frozen fruits. Dried fruits without added sugar. One hundred percent fruit juice (½ cup [237 mL] daily).  · Eat Less Often: Dried fruits with added sugar. Canned fruit in light or heavy syrup.  Lean Meats, Fish, and Poultry (2  servings or less daily. One serving is 3 to 4 oz [85-114 g]).  · Eat More Often: Ninety percent or leaner ground beef, tenderloin, sirloin. Round cuts of beef, chicken breast, turkey breast. All fish. Grill, bake, or broil your meat. Nothing should be fried.  · Eat Less Often/Avoid: Fatty cuts of meat, turkey, or chicken leg, thigh, or wing. Fried cuts of meat or fish.  Dairy (2 to 3 servings)  · Eat More Often: Low-fat or fat-free milk, low-fat plain or light yogurt, reduced-fat or part-skim cheese.  · Eat Less Often/Avoid: Milk (whole, 2%). Whole milk yogurt. Full-fat cheeses.  Nuts, Seeds, and Legumes (4 to 5 servings per week)  · Eat More Often: All without added salt.  · Eat Less Often/Avoid: Salted nuts and seeds, canned beans with added salt.  Fats and Sweets (limited)  · Eat More Often: Vegetable oils, tub margarines without trans fats, sugar-free gelatin. Mayonnaise and salad dressings.  · Eat Less Often/Avoid: Coconut oils, palm oils, butter, stick margarine, cream, half and half, cookies, candy, pie.  FOR MORE INFORMATION  The Dash Diet Eating Plan: www.dashdiet.org  Document Released: 05/18/2011 Document Revised: 08/21/2011 Document Reviewed: 05/18/2011  ExitCare® Patient Information ©2014 [employer].

## 2013-10-14 LAB — CMP14+EGFR
ALT: 27 IU/L (ref 0–32)
AST: 30 IU/L (ref 0–40)
Albumin/Globulin Ratio: 1.9 (ref 1.1–2.5)
Albumin: 4.3 g/dL (ref 3.6–4.8)
Alkaline Phosphatase: 71 IU/L (ref 39–117)
BUN/Creatinine Ratio: 21 (ref 11–26)
BUN: 18 mg/dL (ref 8–27)
CO2: 27 mmol/L (ref 18–29)
Calcium: 9.5 mg/dL (ref 8.7–10.3)
Chloride: 98 mmol/L (ref 97–108)
Creatinine, Ser: 0.87 mg/dL (ref 0.57–1.00)
GFR calc Af Amer: 83 mL/min/{1.73_m2} (ref >59)
GFR calc non Af Amer: 72 mL/min/{1.73_m2} (ref >59)
Globulin, Total: 2.3 g/dL (ref 1.5–4.5)
Glucose: 99 mg/dL (ref 65–99)
Potassium: 3.2 mmol/L — ABNORMAL LOW (ref 3.5–5.2)
Sodium: 142 mmol/L (ref 134–144)
Total Bilirubin: 0.4 mg/dL (ref 0.0–1.2)
Total Protein: 6.6 g/dL (ref 6.0–8.5)

## 2013-10-14 LAB — LIPID PANEL
Chol/HDL Ratio: 1.9 ratio units (ref 0.0–4.4)
Cholesterol, Total: 167 mg/dL (ref 100–199)
HDL: 87 mg/dL (ref >39)
LDL Calculated: 54 mg/dL (ref 0–99)
Triglycerides: 128 mg/dL (ref 0–149)
VLDL Cholesterol Cal: 26 mg/dL (ref 5–40)

## 2013-10-14 LAB — URINE CULTURE

## 2013-10-14 LAB — VITAMIN D 25 HYDROXY (VIT D DEFICIENCY, FRACTURES): Vit D, 25-Hydroxy: 29.9 ng/mL — ABNORMAL LOW (ref 30.0–100.0)

## 2013-10-14 NOTE — Telephone Encounter (Signed)
Do not see med on current med list. Please advise 

## 2013-10-14 NOTE — Telephone Encounter (Signed)
Call patient : Prescription refilled & sent to pharmacy in [place]. 

## 2013-10-16 NOTE — Progress Notes (Signed)
Quick Note:  Call Patient Labs that are abnormal: Potassium is low.  The rest are at goal  Recommendations: Stop the chlorthalidone.she may be taking too many diuretics with the chlorthalidone and the triamterene /HCTZ.. Is She taking the potassium?  Please come in the next few days with all her medications to see Tammy to review her medications and make adjustments.   ______

## 2013-10-17 ENCOUNTER — Ambulatory Visit: Payer: Medicaid Other | Admitting: Family Medicine

## 2013-10-20 ENCOUNTER — Telehealth: Payer: Self-pay | Admitting: Family Medicine

## 2013-10-20 NOTE — Telephone Encounter (Signed)
PAtient aware 

## 2013-10-27 ENCOUNTER — Other Ambulatory Visit: Payer: Self-pay | Admitting: General Practice

## 2013-10-27 ENCOUNTER — Ambulatory Visit: Payer: Medicaid Other

## 2013-11-02 ENCOUNTER — Other Ambulatory Visit: Payer: Self-pay | Admitting: *Deleted

## 2013-11-02 MED ORDER — CYCLOBENZAPRINE HCL 10 MG PO TABS: 10.0000 mg | ORAL_TABLET | Freq: Three times a day (TID) | ORAL | Status: DC | PRN

## 2013-11-02 MED ORDER — CYCLOSPORINE 0.05 % OP EMUL
1.0000 [drp] | Freq: Two times a day (BID) | OPHTHALMIC | Status: DC
Start: 2013-11-02 — End: 2014-01-22

## 2013-11-02 MED ORDER — VENLAFAXINE HCL ER 150 MG PO TB24: 1.0000 | ORAL_TABLET | Freq: Every morning | ORAL | Status: DC

## 2013-11-02 MED ORDER — POTASSIUM CHLORIDE CRYS ER 20 MEQ PO TBCR: 20.0000 meq | EXTENDED_RELEASE_TABLET | Freq: Three times a day (TID) | ORAL | Status: DC

## 2013-11-02 NOTE — Telephone Encounter (Signed)
Last filled 09/09/13, last seen by you 07/01/13, last seen by [place] Charon 10/13/13. Call into [facility] Store

## 2013-11-17 ENCOUNTER — Ambulatory Visit: Payer: Medicaid Other

## 2013-11-28 ENCOUNTER — Other Ambulatory Visit: Payer: Self-pay | Admitting: Nurse Practitioner

## 2013-11-28 NOTE — Telephone Encounter (Signed)
done

## 2013-11-29 ENCOUNTER — Telehealth: Payer: Self-pay | Admitting: Nurse Practitioner

## 2013-11-29 MED ORDER — CLONAZEPAM 2 MG PO TABS: ORAL_TABLET | ORAL | Status: DC

## 2013-11-29 NOTE — Telephone Encounter (Signed)
I sent the Rx to the pharmacy.

## 2013-12-02 ENCOUNTER — Other Ambulatory Visit: Payer: Self-pay | Admitting: Family Medicine

## 2013-12-09 ENCOUNTER — Ambulatory Visit (INDEPENDENT_AMBULATORY_CARE_PROVIDER_SITE_OTHER): Payer: Medicaid Other | Admitting: Pharmacist Clinician (PhC)/ Clinical Pharmacy Specialist

## 2013-12-09 ENCOUNTER — Encounter: Payer: Self-pay | Admitting: Pharmacist Clinician (PhC)/ Clinical Pharmacy Specialist

## 2013-12-09 DIAGNOSIS — R7309 Other abnormal glucose: Secondary | ICD-10-CM

## 2013-12-09 DIAGNOSIS — E876 Hypokalemia: Secondary | ICD-10-CM

## 2013-12-09 DIAGNOSIS — I10 Essential (primary) hypertension: Secondary | ICD-10-CM

## 2013-12-09 LAB — POCT GLYCOSYLATED HEMOGLOBIN (HGB A1C): Hemoglobin A1C: 5.5

## 2013-12-09 NOTE — Progress Notes (Signed)
[name] HammockSandra [name] was referred by Dr. Modesto CharonWong for a consultation for hypokalemia.  Patient had several medications that had been stopped that were still listed on her med list.  I discontinued her triamterene/HCTZ and estrace that she has been off of for several months.  Her cardiologist added chlorthalidone and she is still taking potassium supplementation.  Patient is on vytorin 10-40, niacin 1500mg , and fish oil.  Stopped Niacin today due to adverse effects and lack of clinical trial data.  She has labs checked today:  BMP, NMR, and A1C.  Patient unable to exercise.  Reviewed healthy heart diet with patient during visit and reviewed medications and their indications with her.  Patient set up for a physical with Dr. Hyacinth MeekerMiller during today's visit.

## 2013-12-10 LAB — BMP8+EGFR
BUN/Creatinine Ratio: 18 (ref 11–26)
BUN: 17 mg/dL (ref 8–27)
CALCIUM: 10.1 mg/dL (ref 8.7–10.3)
CO2: 25 mmol/L (ref 18–29)
CREATININE: 0.96 mg/dL (ref 0.57–1.00)
Chloride: 97 mmol/L (ref 97–108)
GFR calc non Af Amer: 64 mL/min/{1.73_m2} (ref >59)
GFR, EST AFRICAN AMERICAN: 73 mL/min/{1.73_m2} (ref >59)
Glucose: 109 mg/dL — ABNORMAL HIGH (ref 65–99)
POTASSIUM: 2.9 mmol/L — AB (ref 3.5–5.2)
Sodium: 141 mmol/L (ref 134–144)

## 2013-12-10 LAB — NMR, LIPOPROFILE
CHOLESTEROL: 170 mg/dL (ref 100–199)
HDL CHOLESTEROL BY NMR: 92 mg/dL (ref >39)
HDL Particle Number: 45.4 umol/L (ref >=30.5)
LDL PARTICLE NUMBER: 494 nmol/L (ref <1000)
LDL SIZE: 20.9 nm (ref >20.5)
LDLC SERPL CALC-MCNC: 57 mg/dL (ref 0–99)
LP-IR Score: 34 (ref <=45)
Small LDL Particle Number: <90 nmol/L (ref <=527)
TRIGLYCERIDES BY NMR: 106 mg/dL (ref 0–149)

## 2013-12-11 ENCOUNTER — Telehealth: Payer: Self-pay | Admitting: Pharmacist Clinician (PhC)/ Clinical Pharmacy Specialist

## 2013-12-15 NOTE — Telephone Encounter (Signed)
Patient's potassium was still low - Recommend hold chlorthalidone.  Per Dr Kathi DerMoore's note he is recommending recheck BMET this week . Tried to call patient no answer.

## 2013-12-17 NOTE — Addendum Note (Signed)
Addended by: Baltazar ApoJOYCE, Tacari Repass W on: 12/17/2013 12:46 PM   Modules accepted: Orders

## 2013-12-18 ENCOUNTER — Encounter: Payer: Self-pay | Admitting: Nurse Practitioner

## 2013-12-18 ENCOUNTER — Other Ambulatory Visit: Payer: Medicaid Other

## 2013-12-18 ENCOUNTER — Other Ambulatory Visit: Payer: Medicaid Other | Admitting: Family Medicine

## 2013-12-18 ENCOUNTER — Ambulatory Visit (INDEPENDENT_AMBULATORY_CARE_PROVIDER_SITE_OTHER): Payer: Medicaid Other | Admitting: Nurse Practitioner

## 2013-12-18 VITALS — BP 118/72 | HR 79 | Temp 98.6°F | Wt 217.0 lb

## 2013-12-18 DIAGNOSIS — N390 Urinary tract infection, site not specified: Secondary | ICD-10-CM

## 2013-12-18 DIAGNOSIS — R7989 Other specified abnormal findings of blood chemistry: Secondary | ICD-10-CM

## 2013-12-18 LAB — POCT URINALYSIS DIPSTICK
Bilirubin, UA: NEGATIVE
Glucose, UA: NEGATIVE
KETONES UA: NEGATIVE
Nitrite, UA: POSITIVE
SPEC GRAV UA: 1.01
Urobilinogen, UA: NEGATIVE
pH, UA: 8

## 2013-12-18 LAB — POCT UA - MICROSCOPIC ONLY
Casts, Ur, LPF, POC: NEGATIVE
Crystals, Ur, HPF, POC: NEGATIVE
Mucus, UA: NEGATIVE
Yeast, UA: NEGATIVE

## 2013-12-18 MED ORDER — CIPROFLOXACIN HCL 500 MG PO TABS: 500.0000 mg | ORAL_TABLET | Freq: Two times a day (BID) | ORAL | Status: DC

## 2013-12-18 NOTE — Progress Notes (Addendum)
   Subjective:    Patient ID: [name], female    DOB: 02/26/1951, 63 y.o.   MRN: [id number]  Dysuria  This is a new (seeing a urologist for bladder infection. chronic problem. ) problem. The current episode started in the past 7 days. The problem occurs every urination. The problem has been gradually worsening. The pain is at a severity of 8/10 (feels like pressure with urination.). There has been no fever. She is not sexually active. There is no history of pyelonephritis. Associated symptoms include frequency. Pertinent negatives include no chills, hematuria, hesitancy or urgency. She has tried nothing for the symptoms. Her past medical history is significant for recurrent UTIs.      Review of Systems  Constitutional: Negative.  Negative for chills.  HENT: Negative.   Eyes: Negative.   Respiratory: Negative.   Cardiovascular: Negative.   Gastrointestinal: Negative.   Endocrine: Negative.   Genitourinary: Positive for dysuria and frequency. Negative for hesitancy, urgency and hematuria.  Allergic/Immunologic: Negative.   Neurological: Negative.   Hematological: Negative.        Objective:   Physical Exam  Constitutional: She is oriented to person, place, and time. She appears well-developed and well-nourished.  HENT:  Head: Normocephalic.  Eyes: Pupils are equal, round, and reactive to light.  Neck: Normal range of motion.  Cardiovascular: Normal rate.   Pulmonary/Chest: Effort normal.  Abdominal: Soft.  Musculoskeletal: Normal range of motion.  Neurological: She is alert and oriented to person, place, and time.  Skin: Skin is warm.   BP 118/72  Pulse 79  Temp(Src) 98.6 F (37 C)  Wt 217 lb (98.431 kg)  Results for orders placed in visit on 12/18/13  POCT UA - MICROSCOPIC ONLY      Result Value Ref Range   WBC, Ur, HPF, POC 20-25     RBC, urine, microscopic 10-15     Bacteria, U Microscopic many     Mucus, UA neg     Epithelial cells, urine per micros occ     Crystals, Ur, HPF, POC neg     Casts, Ur, LPF, POC neg     Yeast, UA neg    POCT URINALYSIS DIPSTICK      Result Value Ref Range   Color, UA yellow     Clarity, UA cloudy     Glucose, UA neg     Bilirubin, UA neg     Ketones, UA neg     Spec Grav, UA 1.010     Blood, UA mod     pH, UA 8.0     Protein, UA 2++     Urobilinogen, UA negative     Nitrite, UA pos     Leukocytes, UA large (3+)            Assessment & Plan:   1. Other abnormal blood chemistry   2. Urinary tract infection without hematuria, site unspecified    Meds ordered this encounter  Medications  . ciprofloxacin (CIPRO) 500 MG tablet    Sig: Take 1 tablet (500 mg total) by mouth 2 (two) times daily.    Dispense:  14 tablet    Refill:  0    Order Specific Question:  Supervising Provider    Answer:  Deborra MedinaMOORE, DONALD W [[id number]]   Force fluids AZO over the counter X2 days RTO prn Culture pending  Mary-Margaret Daphine DeutscherMartin, FNP

## 2013-12-18 NOTE — Patient Instructions (Signed)

## 2013-12-19 LAB — BMP8+EGFR
BUN/Creatinine Ratio: 19 (ref 11–26)
BUN: 18 mg/dL (ref 8–27)
CO2: 26 mmol/L (ref 18–29)
CREATININE: 0.96 mg/dL (ref 0.57–1.00)
Calcium: 9.7 mg/dL (ref 8.7–10.3)
Chloride: 98 mmol/L (ref 97–108)
GFR calc non Af Amer: 64 mL/min/{1.73_m2} (ref >59)
GFR, EST AFRICAN AMERICAN: 73 mL/min/{1.73_m2} (ref >59)
GLUCOSE: 82 mg/dL (ref 65–99)
Potassium: 3.6 mmol/L (ref 3.5–5.2)
Sodium: 140 mmol/L (ref 134–144)

## 2013-12-20 ENCOUNTER — Telehealth: Payer: Self-pay | Admitting: Nurse Practitioner

## 2013-12-22 LAB — URINE CULTURE

## 2013-12-25 ENCOUNTER — Encounter: Payer: Self-pay | Admitting: Nurse Practitioner

## 2013-12-26 ENCOUNTER — Other Ambulatory Visit: Payer: Self-pay | Admitting: Nurse Practitioner

## 2013-12-26 ENCOUNTER — Other Ambulatory Visit: Payer: Self-pay | Admitting: Family Medicine

## 2013-12-26 NOTE — Telephone Encounter (Signed)
done

## 2013-12-26 NOTE — Telephone Encounter (Signed)
Please call in klonopin with 0 refills- do not fill till 12/29/13

## 2013-12-26 NOTE — Telephone Encounter (Signed)
Patient last seen in office on 12-18-13. Rx last filled on 11-29-13 for #60. Please advise. If approved please route to Pool B so nurse can phone in to pharmacy

## 2014-01-06 ENCOUNTER — Ambulatory Visit (INDEPENDENT_AMBULATORY_CARE_PROVIDER_SITE_OTHER): Payer: Medicaid Other | Admitting: Urology

## 2014-01-06 DIAGNOSIS — N3020 Other chronic cystitis without hematuria: Secondary | ICD-10-CM

## 2014-01-06 DIAGNOSIS — N952 Postmenopausal atrophic vaginitis: Secondary | ICD-10-CM

## 2014-01-21 ENCOUNTER — Ambulatory Visit (INDEPENDENT_AMBULATORY_CARE_PROVIDER_SITE_OTHER): Payer: Medicaid Other | Admitting: Family Medicine

## 2014-01-21 VITALS — BP 125/79 | HR 72 | Temp 98.3°F | Ht 65.0 in | Wt 224.8 lb

## 2014-01-21 DIAGNOSIS — E785 Hyperlipidemia, unspecified: Secondary | ICD-10-CM

## 2014-01-21 DIAGNOSIS — R300 Dysuria: Secondary | ICD-10-CM

## 2014-01-21 DIAGNOSIS — I10 Essential (primary) hypertension: Secondary | ICD-10-CM

## 2014-01-21 DIAGNOSIS — N390 Urinary tract infection, site not specified: Secondary | ICD-10-CM

## 2014-01-21 LAB — POCT UA - MICROSCOPIC ONLY
Casts, Ur, LPF, POC: NEGATIVE
Crystals, Ur, HPF, POC: NEGATIVE
RBC, urine, microscopic: NEGATIVE
Yeast, UA: NEGATIVE

## 2014-01-21 LAB — POCT URINALYSIS DIPSTICK

## 2014-01-21 MED ORDER — CIPROFLOXACIN HCL 500 MG PO TABS: 500.0000 mg | ORAL_TABLET | Freq: Two times a day (BID) | ORAL | Status: DC

## 2014-01-21 NOTE — Progress Notes (Signed)
   Subjective:    Patient ID: [name], female    DOB: 03/04/1951, 63 y.o.   MRN: [id number]  HPI C/o urinary sx's.  She has gained 10 pounds this week.  She has recently seen nephrology and had her chlorthalidone dc'd and she states she has gained 10 pounds.  She has been having potassium wasting.  She has been seeing cardiolgy and states that cardiology put her on the chlorthalidone.    Review of Systems No chest pain, SOB, HA, dizziness, vision change, N/V, diarrhea, constipation, dysuria, urinary urgency or frequency, myalgias, arthralgias or rash.     Objective:   Physical Exam  Vital signs noted  Well developed well nourished female.  HEENT - Head atraumatic Normocephalic Respiratory - Lungs CTA bilateral Cardiac - RRR S1 and S2 without murmur GI - Abdomen soft Nontender and bowel sounds active x 4 Extremities - Bilaterarl 1 plus pre tibial and pedal edema Neuro - Grossly intact.  Results for orders placed in visit on 01/21/14  POCT UA - MICROSCOPIC ONLY      Result Value Ref Range   WBC, Ur, HPF, POC 10-20     RBC, urine, microscopic negative     Bacteria, U Microscopic few     Mucus, UA few     Epithelial cells, urine per micros occ     Crystals, Ur, HPF, POC negative     Casts, Ur, LPF, POC negative     Yeast, UA negative    POCT URINALYSIS DIPSTICK      Result Value Ref Range   Color, UA orange     Clarity, UA clear        Assessment & Plan:  Dysuria - Plan: POCT UA - Microscopic Only, POCT urinalysis dipstick, ciprofloxacin (CIPRO) 500 MG tablet, Urine culture, POCT urinalysis dipstick, POCT UA - Microscopic Only  UTI (lower urinary tract infection) - Plan: ciprofloxacin (CIPRO) 500 MG tablet, Urine culture  Other and unspecified hyperlipidemia - Plan: Lipid panel  Essential hypertension, benign - Plan: POCT CBC, CMP14+EGFR, Magnesium  Edema - Get back on chlorthalidone and follow up in 3 weeks for cpe and follow up and check bmp then.  William J  [place] FNP  

## 2014-01-22 ENCOUNTER — Other Ambulatory Visit: Payer: Self-pay | Admitting: Family Medicine

## 2014-01-22 ENCOUNTER — Other Ambulatory Visit: Payer: Self-pay | Admitting: Physician Assistant

## 2014-01-22 ENCOUNTER — Other Ambulatory Visit: Payer: Self-pay | Admitting: Nurse Practitioner

## 2014-01-22 LAB — URINE CULTURE: Organism ID, Bacteria: NO GROWTH

## 2014-01-22 NOTE — Telephone Encounter (Signed)
Please call in kloinopin with 1p refills

## 2014-01-23 ENCOUNTER — Other Ambulatory Visit (INDEPENDENT_AMBULATORY_CARE_PROVIDER_SITE_OTHER): Payer: Medicaid Other

## 2014-01-23 DIAGNOSIS — I10 Essential (primary) hypertension: Secondary | ICD-10-CM

## 2014-01-23 DIAGNOSIS — E785 Hyperlipidemia, unspecified: Secondary | ICD-10-CM

## 2014-01-23 DIAGNOSIS — R300 Dysuria: Secondary | ICD-10-CM

## 2014-01-23 LAB — POCT URINALYSIS DIPSTICK
Bilirubin, UA: NEGATIVE
Glucose, UA: NEGATIVE
Ketones, UA: NEGATIVE
Nitrite, UA: NEGATIVE
Protein, UA: NEGATIVE
Spec Grav, UA: 1.02
Urobilinogen, UA: NEGATIVE
pH, UA: 6

## 2014-01-23 LAB — POCT UA - MICROSCOPIC ONLY
Bacteria, U Microscopic: NEGATIVE
Casts, Ur, LPF, POC: NEGATIVE
Crystals, Ur, HPF, POC: NEGATIVE
Mucus, UA: NEGATIVE
Yeast, UA: NEGATIVE

## 2014-01-23 LAB — POCT CBC
Granulocyte percent: 61.6 %G (ref 37–80)
HCT, POC: 40.9 % (ref 37.7–47.9)
Hemoglobin: 14 g/dL (ref 12.2–16.2)
Lymph, poc: 2.5 (ref 0.6–3.4)
MCH, POC: 30.2 pg (ref 27–31.2)
MCHC: 34.3 g/dL (ref 31.8–35.4)
MCV: 88.3 fL (ref 80–97)
MPV: 7.3 fL (ref 0–99.8)
POC Granulocyte: 4.6 (ref 2–6.9)
POC LYMPH PERCENT: 33.9 %L (ref 10–50)
Platelet Count, POC: 205 10*3/uL (ref 142–424)
RBC: 4.6 M/uL (ref 4.04–5.48)
RDW, POC: 12.1 %
WBC: 7.5 10*3/uL (ref 4.6–10.2)

## 2014-01-23 NOTE — Progress Notes (Signed)
Patient came in for labs only.

## 2014-01-23 NOTE — Telephone Encounter (Signed)
Called in.

## 2014-01-24 LAB — CMP14+EGFR
ALT: 17 IU/L (ref 0–32)
AST: 19 IU/L (ref 0–40)
Albumin/Globulin Ratio: 1.7 (ref 1.1–2.5)
Albumin: 4.2 g/dL (ref 3.6–4.8)
Alkaline Phosphatase: 67 IU/L (ref 39–117)
BUN/Creatinine Ratio: 20 (ref 11–26)
BUN: 21 mg/dL (ref 8–27)
CO2: 25 mmol/L (ref 18–29)
Calcium: 9.8 mg/dL (ref 8.7–10.3)
Chloride: 100 mmol/L (ref 97–108)
Creatinine, Ser: 1.05 mg/dL — ABNORMAL HIGH (ref 0.57–1.00)
GFR calc Af Amer: 66 mL/min/{1.73_m2} (ref >59)
GFR calc non Af Amer: 57 mL/min/{1.73_m2} — ABNORMAL LOW (ref >59)
Globulin, Total: 2.5 g/dL (ref 1.5–4.5)
Glucose: 97 mg/dL (ref 65–99)
Potassium: 3.6 mmol/L (ref 3.5–5.2)
Sodium: 141 mmol/L (ref 134–144)
Total Bilirubin: 0.5 mg/dL (ref 0.0–1.2)
Total Protein: 6.7 g/dL (ref 6.0–8.5)

## 2014-01-24 LAB — LIPID PANEL
Chol/HDL Ratio: 2.1 ratio units (ref 0.0–4.4)
Cholesterol, Total: 182 mg/dL (ref 100–199)
HDL: 87 mg/dL (ref >39)
LDL Calculated: 74 mg/dL (ref 0–99)
Triglycerides: 106 mg/dL (ref 0–149)
VLDL Cholesterol Cal: 21 mg/dL (ref 5–40)

## 2014-01-24 LAB — MAGNESIUM: Magnesium: 1.9 mg/dL (ref 1.6–2.6)

## 2014-01-27 ENCOUNTER — Telehealth: Payer: Self-pay | Admitting: *Deleted

## 2014-01-27 NOTE — Telephone Encounter (Signed)
Pt notified of results Verbalizes understanding 

## 2014-01-27 NOTE — Telephone Encounter (Signed)
Message copied by Bearl MulberryUTHERFORD, Emori Kamau K on Tue Jan 27, 2014  2:33 PM ------      Message from: Lendon ColonelHAWKS, [place]ST[place] A      Created: Mon Jan 26, 2014 12:59 PM       WBC, Hgb, and Plts-WNL      Cholesterol WNL      Kidney and liver function stable      Magnesium levels WNL      Urine positive for UTI-Continue medication ------

## 2014-02-06 ENCOUNTER — Other Ambulatory Visit: Payer: Medicaid Other | Admitting: Family Medicine

## 2014-02-11 ENCOUNTER — Encounter: Payer: Self-pay | Admitting: Family Medicine

## 2014-02-11 ENCOUNTER — Ambulatory Visit (INDEPENDENT_AMBULATORY_CARE_PROVIDER_SITE_OTHER): Payer: Medicaid Other | Admitting: Family Medicine

## 2014-02-11 VITALS — BP 139/81 | HR 78 | Temp 98.6°F | Ht 65.0 in | Wt 215.8 lb

## 2014-02-11 DIAGNOSIS — Z0000 Encounter for general adult medical examination without abnormal findings: Secondary | ICD-10-CM

## 2014-02-11 DIAGNOSIS — Z124 Encounter for screening for malignant neoplasm of cervix: Secondary | ICD-10-CM

## 2014-02-13 LAB — PAP IG W/ RFLX HPV ASCU: PAP Smear Comment: 0

## 2014-02-13 NOTE — Progress Notes (Signed)
   Subjective:    Patient ID: [name], female    DOB: 11/25/1950, 63 y.o.   MRN: [id number]  HPI This 63 y.o. female presents for evaluation of female exam.  She has hx of partial hysterectomy and has not had a pap smear in awhile.  She has been scheduled for mammogram.   Review of Systems No chest pain, SOB, HA, dizziness, vision change, N/V, diarrhea, constipation, dysuria, urinary urgency or frequency, myalgias, arthralgias or rash.     Objective:   Physical Exam Vital signs noted  Well developed well nourished female.  HEENT - Head atraumatic Normocephalic                Eyes - PERRLA, Conjuctiva - clear Sclera- Clear EOMI                Ears - EAC's Wnl TM's Wnl Gross Hearing WNL                Nose - Nares patent                 Throat - oropharanx wnl Respiratory - Lungs CTA bilateral Cardiac - RRR S1 and S2 without murmur GI - Abdomen soft Nontender and bowel sounds active x 4 Breast - No masses bilateral GU - BUS wnl Vagina atrophic and no uterus.  Adenexa nontender Extremities - No edema. Neuro - Grossly intact.       Assessment & Plan:  Screening for malignant neoplasm of the cervix - Plan: Pap IG w/ reflex to HPV when ASC-U Explained if results are normal then she doesn't need pap for next 5 years.  [name] FNP

## 2014-02-20 ENCOUNTER — Other Ambulatory Visit: Payer: Self-pay | Admitting: Family Medicine

## 2014-02-20 ENCOUNTER — Other Ambulatory Visit: Payer: Self-pay | Admitting: Nurse Practitioner

## 2014-03-23 ENCOUNTER — Other Ambulatory Visit: Payer: Self-pay | Admitting: Nurse Practitioner

## 2014-03-23 ENCOUNTER — Other Ambulatory Visit: Payer: Self-pay | Admitting: Family Medicine

## 2014-03-24 NOTE — Telephone Encounter (Signed)
Last filled 02/20/14, last seen 02/11/14. Route to pool, nurse call into [facility]

## 2014-03-25 NOTE — Telephone Encounter (Signed)
Please advise on refills. Do not see rx for furosemide on current med list

## 2014-04-13 ENCOUNTER — Encounter: Payer: Self-pay | Admitting: Family Medicine

## 2014-04-13 ENCOUNTER — Telehealth: Payer: Self-pay | Admitting: Family Medicine

## 2014-04-21 ENCOUNTER — Other Ambulatory Visit: Payer: Self-pay | Admitting: Family Medicine

## 2014-04-21 ENCOUNTER — Other Ambulatory Visit: Payer: Self-pay | Admitting: Nurse Practitioner

## 2014-04-22 NOTE — Telephone Encounter (Signed)
Patient last seen in office on 02-11-14. Rx for klonopin last filled on 03-23-14 for #60. Please advise

## 2014-04-23 ENCOUNTER — Other Ambulatory Visit: Payer: Self-pay | Admitting: Nurse Practitioner

## 2014-04-23 ENCOUNTER — Telehealth: Payer: Self-pay | Admitting: Nurse Practitioner

## 2014-04-23 NOTE — Telephone Encounter (Signed)
Called in 04/23/14 

## 2014-04-23 NOTE — Telephone Encounter (Signed)
Called in 04/23/14

## 2014-05-19 ENCOUNTER — Ambulatory Visit (INDEPENDENT_AMBULATORY_CARE_PROVIDER_SITE_OTHER): Payer: Medicaid Other | Admitting: Cardiology

## 2014-05-19 ENCOUNTER — Encounter: Payer: Self-pay | Admitting: Cardiology

## 2014-05-19 VITALS — BP 120/86 | HR 75 | Ht 66.0 in | Wt 226.0 lb

## 2014-05-19 DIAGNOSIS — I10 Essential (primary) hypertension: Secondary | ICD-10-CM

## 2014-05-19 DIAGNOSIS — I471 Supraventricular tachycardia: Secondary | ICD-10-CM

## 2014-05-19 NOTE — Assessment & Plan Note (Signed)
Symptomatically stable on Toprol-XL, no changes were made. ECG from earlier in the rear reviewed and stable. Continue annual follow-up.

## 2014-05-19 NOTE — Patient Instructions (Signed)

## 2014-05-19 NOTE — Progress Notes (Signed)
Reason for visit: PSVT  Clinical Summary [name] is a 63 y.o.female last seen in December 2014. She presents for routine follow-up. She has only rare, brief sense of palpitations, continues on Toprol-XL.  ECG from March of this year showed normal sinus rhythm with old right bundle branch block.  Echocardiogram from July 2014 showed mild LVH with LVEF 55-60%, normal diastolic parameters, mild left atrial, mild tricuspid regurgitation, RV-RA gradient 22 mm mercury.  Allergies  Allergen Reactions  . Clindamycin     REACTION: caused C-diff per patient  . Diphenhydramine Hcl Other (See Comments)    REACTION:  Severely drowsy  . Naproxen     Burning in lower abdominal area.    Current Outpatient Prescriptions  Medication Sig Dispense Refill  . AMITIZA 24 MCG capsule TAKE 1 CAPSULE TWICE A DAY WITH FOOD 60 capsule 3  . aspirin EC 81 MG tablet Take by mouth. Take 81 mg by mouth daily.    . Calcium Carbonate-Vitamin D (CALCIUM 500 + D) 500-125 MG-UNIT TABS Take by mouth. Take 1 tablet by mouth every morning.    . chlorthalidone (HYGROTON) 25 MG tablet TAKE ONE TABLET BY MOUTH TWICE DAILY 60 tablet 1  . Cholecalciferol (VITAMIN D-3 PO) Take 1 capsule by mouth every morning.     . clonazePAM (KLONOPIN) 2 MG tablet TAKE 1 TABLET TWICE DAILY AS NEEDED FOR ANXIETY 60 tablet 0  . cyclobenzaprine (FLEXERIL) 10 MG tablet TAKE ONE TABLET BY MOUTH THREE TIMES DAILY AS NEEDED FOR MUSCLE SPASM 90 tablet 0  . ezetimibe-simvastatin (VYTORIN) 10-40 MG per tablet Take 1 tablet by mouth at bedtime. 30 tablet 4  . furosemide (LASIX) 20 MG tablet TAKE 1 TABLET DAILY AS NEEDED FOR SWELLING 30 tablet 1  . levothyroxine (SYNTHROID, LEVOTHROID) 50 MCG tablet TAKE ONE TABLET EVERY MORNING 30 tablet 6  . omega-3 acid ethyl esters (LOVAZA) 1 G capsule Take 1 g by mouth 2 (two) times daily.    [name]. omeprazole (PRILOSEC) 20 MG capsule TAKE ONE CAPSULE BY MOUTH TWICE A DAY 60 capsule 5  . potassium chloride SA  (K-DUR,KLOR-CON) 20 MEQ tablet TAKE ONE (1) TABLET THREE (3) TIMES EACH DAY 90 tablet 5  . PREMARIN vaginal cream APPLY AS DIRECTED 30 g 1  . RESTASIS 0.05 % ophthalmic emulsion PLACE 1 DROP IN BOTH EYES TWICE DAILY 60 each 1  . TOPROL XL 50 MG 24 hr tablet TAKE ONE TABLET TWICE A DAY WITH FOOD 60 tablet 6  . venlafaxine XR (EFFEXOR-XR) 150 MG 24 hr capsule TAKE ONE CAPSULE EACH MORNING 30 capsule 1  . VYTORIN 10-40 MG per tablet TAKE ONE TABLET DAILY AT BEDTIME 30 tablet 1   No current facility-administered medications for this visit.    Past Medical History  Diagnosis Date  . Borderline hypertension   . Obesity   . Hyperlipidemia   . Fe deficiency anemia   . Hiatal hernia   . PSVT (paroxysmal supraventricular tachycardia) 8/05  . Recurrent cystitis     Dr. Aldean AstKimbrough   . Chronic lower back pain   . Lumbar herniated disc     L4-L5   . History of mixed drug abuse   . Anxiety   . Arthritis   . Depression   . History of cardiac catheterization     Normal coronaries  . Heartburn   . Microscopic hematuria   . Personal history of venous thrombosis and embolism   . Hypothyroidism     Dr. Annie MainBaler   .  Chronic shoulder pain     Social History [name] reports that she has never smoked. She has never used smokeless tobacco. [name] reports that she does not drink alcohol.  Review of Systems Complete review of systems negative except as otherwise outlined in the clinical summary and also the following. Chronic knee pain.  Physical Examination Filed Vitals:   05/19/14 1514  BP: 120/86  Pulse: 75   Filed Weights   05/19/14 1514  Weight: 226 lb (102.513 kg)    Patient appears comfortable at rest. HEENT: Conjunctiva and lids normal, oropharynx clear. Neck: Supple, no elevated JVP or carotid bruits, no thyromegaly. Lungs: Clear to auscultation, nonlabored breathing at rest. Cardiac: Regular rate and rhythm, no S3 or significant systolic murmur, no pericardial  rub. Extremities: No pitting edema, distal pulses 2+.   Problem List and Plan   PSVT (paroxysmal supraventricular tachycardia) Symptomatically stable on Toprol-XL, no changes were made. ECG from earlier in the rear reviewed and stable. Continue annual follow-up.  Essential hypertension, benign Blood pressure is reasonably well controlled today.    Jonelle SidleSamuel G. McDowell, [name], F.A.C.C.

## 2014-05-19 NOTE — Assessment & Plan Note (Signed)
Blood pressure is reasonably well controlled today. 

## 2014-05-21 ENCOUNTER — Other Ambulatory Visit: Payer: Self-pay | Admitting: Nurse Practitioner

## 2014-05-21 ENCOUNTER — Other Ambulatory Visit: Payer: Self-pay | Admitting: Family Medicine

## 2014-05-21 NOTE — Telephone Encounter (Signed)
Last ov 9/15. Last refill on Klonopin 04/23/14. If approved call to [facility]. Route to nurse pool. Last thyroid lab 2/15.

## 2014-05-21 NOTE — Telephone Encounter (Signed)
Last ov 9/15. 

## 2014-05-22 ENCOUNTER — Ambulatory Visit (INDEPENDENT_AMBULATORY_CARE_PROVIDER_SITE_OTHER): Payer: Medicaid Other | Admitting: Family Medicine

## 2014-05-22 ENCOUNTER — Encounter: Payer: Self-pay | Admitting: Family Medicine

## 2014-05-22 ENCOUNTER — Ambulatory Visit: Payer: Medicaid Other | Admitting: *Deleted

## 2014-05-22 VITALS — BP 122/70 | HR 117 | Temp 97.3°F | Ht 66.0 in | Wt 224.0 lb

## 2014-05-22 DIAGNOSIS — E785 Hyperlipidemia, unspecified: Secondary | ICD-10-CM

## 2014-05-22 DIAGNOSIS — I10 Essential (primary) hypertension: Secondary | ICD-10-CM

## 2014-05-22 DIAGNOSIS — N390 Urinary tract infection, site not specified: Secondary | ICD-10-CM

## 2014-05-22 DIAGNOSIS — F418 Other specified anxiety disorders: Secondary | ICD-10-CM

## 2014-05-22 DIAGNOSIS — Z23 Encounter for immunization: Secondary | ICD-10-CM

## 2014-05-22 LAB — POCT CBC
Granulocyte percent: 54.8 %G (ref 37–80)
HCT, POC: 42.2 % (ref 37.7–47.9)
Hemoglobin: 13.9 g/dL (ref 12.2–16.2)
Lymph, poc: 2.7 (ref 0.6–3.4)
MCH, POC: 28.7 pg (ref 27–31.2)
MCHC: 33 g/dL (ref 31.8–35.4)
MCV: 86.8 fL (ref 80–97)
MPV: 7.3 fL (ref 0–99.8)
POC Granulocyte: 4 (ref 2–6.9)
POC LYMPH PERCENT: 37.3 %L (ref 10–50)
Platelet Count, POC: 236 10*3/uL (ref 142–424)
RBC: 4.9 M/uL (ref 4.04–5.48)
RDW, POC: 12.6 %
WBC: 7.3 10*3/uL (ref 4.6–10.2)

## 2014-05-22 LAB — POCT URINALYSIS DIPSTICK
Bilirubin, UA: NEGATIVE
Blood, UA: NEGATIVE
Glucose, UA: NEGATIVE
Ketones, UA: NEGATIVE
Nitrite, UA: POSITIVE
Protein, UA: NEGATIVE
Spec Grav, UA: 1.015
Urobilinogen, UA: NEGATIVE
pH, UA: 7

## 2014-05-22 LAB — POCT UA - MICROSCOPIC ONLY
Casts, Ur, LPF, POC: NEGATIVE
Crystals, Ur, HPF, POC: NEGATIVE
Mucus, UA: NEGATIVE
RBC, urine, microscopic: NEGATIVE
Yeast, UA: NEGATIVE

## 2014-05-22 MED ORDER — CLONAZEPAM 2 MG PO TABS: 2.0000 mg | ORAL_TABLET | Freq: Two times a day (BID) | ORAL | Status: DC | PRN

## 2014-05-22 MED ORDER — CIPROFLOXACIN HCL 500 MG PO TABS: 500.0000 mg | ORAL_TABLET | Freq: Two times a day (BID) | ORAL | Status: DC

## 2014-05-22 NOTE — Progress Notes (Signed)
   Subjective:    Patient ID: [name], female    DOB: 02/10/1951, 63 y.o.   MRN: [id number]  HPI Patient is here for follow up.  She is c/o urinary sx's.  She needs refills on her clonazepam.  Review of Systems  Constitutional: Negative for fever.  HENT: Negative for ear pain.   Eyes: Negative for discharge.  Respiratory: Negative for cough.   Cardiovascular: Negative for chest pain.  Gastrointestinal: Negative for abdominal distention.  Endocrine: Negative for polyuria.  Genitourinary: Negative for difficulty urinating.  Musculoskeletal: Negative for gait problem and neck pain.  Skin: Negative for color change and rash.  Neurological: Negative for speech difficulty and headaches.  Psychiatric/Behavioral: Negative for agitation.       Objective:    BP 122/70 mmHg  Pulse 117  Temp(Src) 97.3 F (36.3 C) (Oral)  Ht _0  (1.676 m)  Wt 224 lb (101.606 kg)  BMI 36.17 kg/m2 Physical Exam  Constitutional: She is oriented to person, place, and time. She appears well-developed and well-nourished.  HENT:  Head: Normocephalic and atraumatic.  Mouth/Throat: Oropharynx is clear and moist.  Eyes: Pupils are equal, round, and reactive to light.  Neck: Normal range of motion. Neck supple.  Cardiovascular: Normal rate and regular rhythm.   No murmur heard. Pulmonary/Chest: Effort normal and breath sounds normal.  Abdominal: Soft. Bowel sounds are normal. There is no tenderness.  Neurological: She is alert and oriented to person, place, and time.  Skin: Skin is warm and dry.  Psychiatric: She has a normal mood and affect.          Assessment & Plan:     ICD-9-CM ICD-10-CM   1. Hyperlipemia 272.4 E78.5 CMP14+EGFR     Lipid panel  2. Essential hypertension, benign 401.1 I10 POCT CBC     CMP14+EGFR  3. Other specified anxiety disorders 300.09 F41.8 clonazePAM (KLONOPIN) 2 MG tablet     DISCONTINUED: clonazePAM (KLONOPIN) 2 MG tablet  4. Urinary tract infection without  hematuria, site unspecified 599.0 N39.0 POCT UA - Microscopic Only     POCT urinalysis dipstick     ciprofloxacin (CIPRO) 500 MG tablet     No Follow-up on file.  Lysbeth Penner FNP

## 2014-05-23 LAB — CMP14+EGFR
ALT: 19 IU/L (ref 0–32)
AST: 21 IU/L (ref 0–40)
Albumin/Globulin Ratio: 1.6 (ref 1.1–2.5)
Albumin: 4.2 g/dL (ref 3.6–4.8)
Alkaline Phosphatase: 80 IU/L (ref 39–117)
BUN/Creatinine Ratio: 20 (ref 11–26)
BUN: 20 mg/dL (ref 8–27)
CO2: 26 mmol/L (ref 18–29)
Calcium: 10 mg/dL (ref 8.7–10.3)
Chloride: 99 mmol/L (ref 97–108)
Creatinine, Ser: 1.01 mg/dL — ABNORMAL HIGH (ref 0.57–1.00)
GFR calc Af Amer: 69 mL/min/{1.73_m2} (ref >59)
GFR calc non Af Amer: 60 mL/min/{1.73_m2} (ref >59)
Globulin, Total: 2.7 g/dL (ref 1.5–4.5)
Glucose: 90 mg/dL (ref 65–99)
Potassium: 3.4 mmol/L — ABNORMAL LOW (ref 3.5–5.2)
Sodium: 143 mmol/L (ref 134–144)
Total Bilirubin: 0.5 mg/dL (ref 0.0–1.2)
Total Protein: 6.9 g/dL (ref 6.0–8.5)

## 2014-05-23 LAB — LIPID PANEL
Chol/HDL Ratio: 2.6 ratio units (ref 0.0–4.4)
Cholesterol, Total: 183 mg/dL (ref 100–199)
HDL: 70 mg/dL (ref >39)
LDL Calculated: 86 mg/dL (ref 0–99)
Triglycerides: 134 mg/dL (ref 0–149)
VLDL Cholesterol Cal: 27 mg/dL (ref 5–40)

## 2014-05-28 ENCOUNTER — Telehealth: Payer: Self-pay | Admitting: *Deleted

## 2014-05-28 NOTE — Telephone Encounter (Signed)
Patient is calling requesting urine and cholesterol results. Will you please review and we will contact patient.

## 2014-05-29 ENCOUNTER — Other Ambulatory Visit: Payer: Self-pay | Admitting: Family Medicine

## 2014-05-29 ENCOUNTER — Other Ambulatory Visit: Payer: Self-pay

## 2014-05-29 DIAGNOSIS — Z1231 Encounter for screening mammogram for malignant neoplasm of breast: Secondary | ICD-10-CM

## 2014-05-29 NOTE — Telephone Encounter (Signed)
Stp advised of providers feedback, pt has no complaints of urinary symptoms so no need for urine cx, pt wanted a copy of her labs so i printed them and put them in the mail. Will close encounter.

## 2014-05-29 NOTE — Telephone Encounter (Signed)
I already commented for her to take more po K since K was slightly low and her other labs were normal.  UA cx did not result?  If still having ua sx's then repeat ua and cx.

## 2014-05-29 NOTE — Telephone Encounter (Signed)
Pt calling for lab results. It has been a week and no one has contacted her.  Call [name] DavenportSandra at [phone]

## 2014-06-18 ENCOUNTER — Other Ambulatory Visit: Payer: Self-pay | Admitting: Nurse Practitioner

## 2014-07-03 ENCOUNTER — Ambulatory Visit: Payer: Medicaid Other

## 2014-07-06 ENCOUNTER — Ambulatory Visit: Payer: Medicaid Other

## 2014-07-14 ENCOUNTER — Ambulatory Visit (INDEPENDENT_AMBULATORY_CARE_PROVIDER_SITE_OTHER): Payer: Medicaid Other | Admitting: Family Medicine

## 2014-07-14 ENCOUNTER — Ambulatory Visit
Admission: RE | Admit: 2014-07-14 | Discharge: 2014-07-14 | Disposition: A | Discharge status: Home / Self Care | Payer: Medicaid Other | Source: Ambulatory Visit

## 2014-07-14 ENCOUNTER — Encounter: Payer: Self-pay | Admitting: Family Medicine

## 2014-07-14 VITALS — BP 151/84 | HR 80 | Temp 97.1°F | Ht 66.0 in | Wt 231.0 lb

## 2014-07-14 DIAGNOSIS — Z1231 Encounter for screening mammogram for malignant neoplasm of breast: Secondary | ICD-10-CM

## 2014-07-14 DIAGNOSIS — M1990 Unspecified osteoarthritis, unspecified site: Secondary | ICD-10-CM

## 2014-07-14 MED ORDER — METHYLPREDNISOLONE (PAK) 4 MG PO TABS: ORAL_TABLET | ORAL | Status: DC

## 2014-07-14 NOTE — Progress Notes (Signed)
   Subjective:    Patient ID: [name] HammockSandra [name], female    DOB: 01/15/1951, 64 y.o.   MRN: [id number]  HPI Patient c/o right collarbone lump and discomfort.  Review of Systems  Constitutional: Negative for fever.  HENT: Negative for ear pain.   Eyes: Negative for discharge.  Respiratory: Negative for cough.   Cardiovascular: Negative for chest pain.  Gastrointestinal: Negative for abdominal distention.  Endocrine: Negative for polyuria.  Genitourinary: Negative for difficulty urinating.  Musculoskeletal: Negative for gait problem and neck pain.  Skin: Negative for color change and rash.  Neurological: Negative for speech difficulty and headaches.  Psychiatric/Behavioral: Negative for agitation.       Objective:    BP 151/84 mmHg  Pulse 80  Temp(Src) 97.1 F (36.2 C) (Oral)  Ht 5\' 6"  (1.676 m)  Wt 231 lb (104.781 kg)  BMI 37.30 kg/m2 Physical Exam  Right Manubrium and clavicle with TTP.  No deformity      Assessment & Plan:     ICD-9-CM ICD-10-CM   1. Arthritis 716.90 M19.90 methylPREDNIsolone (MEDROL DOSPACK) 4 MG tablet   Explained to patient that this is arthritis  No Follow-up on file.  Deatra CanterWilliam J Camillia Marcy FNP

## 2014-07-17 ENCOUNTER — Other Ambulatory Visit: Payer: Self-pay | Admitting: Family Medicine

## 2014-08-17 ENCOUNTER — Other Ambulatory Visit: Payer: Self-pay | Admitting: Nurse Practitioner

## 2014-08-17 ENCOUNTER — Other Ambulatory Visit: Payer: Self-pay | Admitting: Family Medicine

## 2014-08-17 NOTE — Telephone Encounter (Signed)
Please call in klonopin with 1 refills 

## 2014-08-17 NOTE — Telephone Encounter (Signed)
Please advise on refill.  Last seen by Ander SladeBill Oxford on 07/14/14, has appt with Dr. Darlyn ReadStacks 08/28/14.

## 2014-08-18 ENCOUNTER — Other Ambulatory Visit: Payer: Self-pay | Admitting: *Deleted

## 2014-08-18 MED ORDER — METOPROLOL SUCCINATE ER 50 MG PO TB24: 50.0000 mg | ORAL_TABLET | Freq: Two times a day (BID) | ORAL | Status: DC

## 2014-08-18 NOTE — Telephone Encounter (Signed)
rx called into pharmacy

## 2014-08-28 ENCOUNTER — Ambulatory Visit: Payer: Medicaid Other | Admitting: Family Medicine

## 2014-09-15 ENCOUNTER — Other Ambulatory Visit: Payer: Self-pay | Admitting: Nurse Practitioner

## 2014-09-15 ENCOUNTER — Other Ambulatory Visit: Payer: Self-pay | Admitting: Family Medicine

## 2014-09-15 ENCOUNTER — Ambulatory Visit: Payer: Medicaid Other | Admitting: Family Medicine

## 2014-09-16 NOTE — Telephone Encounter (Signed)
Please call in clonazepam with 1 refills 

## 2014-09-16 NOTE — Telephone Encounter (Signed)
Last seen 07/14/14 B [place]

## 2014-09-16 NOTE — Telephone Encounter (Signed)
rx called into pharmacy

## 2014-09-23 ENCOUNTER — Ambulatory Visit (INDEPENDENT_AMBULATORY_CARE_PROVIDER_SITE_OTHER): Payer: Medicaid Other | Admitting: Family Medicine

## 2014-09-23 ENCOUNTER — Encounter: Payer: Self-pay | Admitting: Family Medicine

## 2014-09-23 ENCOUNTER — Ambulatory Visit (INDEPENDENT_AMBULATORY_CARE_PROVIDER_SITE_OTHER): Payer: Medicaid Other

## 2014-09-23 VITALS — BP 133/81 | HR 75 | Temp 97.8°F | Ht 66.0 in | Wt 230.8 lb

## 2014-09-23 DIAGNOSIS — R300 Dysuria: Secondary | ICD-10-CM | POA: Diagnosis not present

## 2014-09-23 DIAGNOSIS — E785 Hyperlipidemia, unspecified: Secondary | ICD-10-CM | POA: Diagnosis not present

## 2014-09-23 DIAGNOSIS — E038 Other specified hypothyroidism: Secondary | ICD-10-CM

## 2014-09-23 DIAGNOSIS — L989 Disorder of the skin and subcutaneous tissue, unspecified: Secondary | ICD-10-CM

## 2014-09-23 DIAGNOSIS — E559 Vitamin D deficiency, unspecified: Secondary | ICD-10-CM | POA: Diagnosis not present

## 2014-09-23 DIAGNOSIS — I10 Essential (primary) hypertension: Secondary | ICD-10-CM | POA: Diagnosis not present

## 2014-09-23 LAB — POCT CBC
GRANULOCYTE PERCENT: 59.7 % (ref 37–80)
HCT, POC: 44.6 % (ref 37.7–47.9)
Hemoglobin: 14.6 g/dL (ref 12.2–16.2)
Lymph, poc: 2.5 (ref 0.6–3.4)
MCH: 28.8 pg (ref 27–31.2)
MCHC: 32.8 g/dL (ref 31.8–35.4)
MCV: 87.7 fL (ref 80–97)
MPV: 7 fL (ref 0–99.8)
POC Granulocyte: 4.6 (ref 2–6.9)
POC LYMPH %: 32.8 % (ref 10–50)
Platelet Count, POC: 259 10*3/uL (ref 142–424)
RBC: 5.08 M/uL (ref 4.04–5.48)
RDW, POC: 12.6 %
WBC: 7.7 10*3/uL (ref 4.6–10.2)

## 2014-09-23 LAB — POCT UA - MICROSCOPIC ONLY
CASTS, UR, LPF, POC: NEGATIVE
CRYSTALS, UR, HPF, POC: NEGATIVE
Mucus, UA: NEGATIVE
YEAST UA: NEGATIVE

## 2014-09-23 LAB — POCT URINALYSIS DIPSTICK
BILIRUBIN UA: NEGATIVE
Glucose, UA: NEGATIVE
Ketones, UA: NEGATIVE
Nitrite, UA: NEGATIVE
PH UA: 7
SPEC GRAV UA: 1.015
Urobilinogen, UA: NEGATIVE

## 2014-09-23 MED ORDER — NITROFURANTOIN MONOHYD MACRO 100 MG PO CAPS: 100.0000 mg | ORAL_CAPSULE | Freq: Two times a day (BID) | ORAL | Status: DC

## 2014-09-23 MED ORDER — LEVOTHYROXINE SODIUM 50 MCG PO TABS: 50.0000 ug | ORAL_TABLET | Freq: Every morning | ORAL | Status: DC

## 2014-09-23 MED ORDER — LEVOTHYROXINE SODIUM 75 MCG PO TABS: 75.0000 ug | ORAL_TABLET | Freq: Every day | ORAL | Status: DC

## 2014-09-23 NOTE — Addendum Note (Signed)
Addended by: Orma RenderHODGES, Jermya Dowding F on: 09/23/2014 02:38 PM   Modules accepted: Orders

## 2014-09-23 NOTE — Progress Notes (Signed)
Subjective:  Patient ID: [name], female    DOB: 01/13/1951  Age: 64 y.o. MRN: [id number]  CC: Hypertension; Hyperlipidemia; Hypothyroidism; and Vit D deficiency   HPI [name] presents for  follow-up of hypertension. Patient has no history of headache chest pain or shortness of breath or recent cough. Patient also denies symptoms of TIA such as numbness weakness lateralizing. Patient checks  blood pressure at home and has not had any elevated readings recently. Patient denies side effects from his medication. States taking it regularly. Patient presents for follow-up on  thyroid. She has a history of hypothyroidism for many years. It has been stable recently. Pt. denies any change in  voice, loss of hair, heat or cold intolerance. Energy level has been rather poor. She just wants to sleep all the time. She denies constipation and diarrhea. No myxedema. Medication is as noted below. Verified that pt is taking it daily on an empty stomach. Well tolerated.  History [name] has a past medical history of Borderline hypertension; Obesity; Hyperlipidemia; Fe deficiency anemia; Hiatal hernia; PSVT (paroxysmal supraventricular tachycardia) (8/05); Recurrent cystitis; Chronic lower back pain; Lumbar herniated disc; History of mixed drug abuse; Anxiety; Arthritis; Depression; History of cardiac catheterization; Heartburn; Microscopic hematuria; Personal history of venous thrombosis and embolism; Hypothyroidism; and Chronic shoulder pain.   She has past surgical history that includes Appendectomy (2004?or 11/03?); Partial hysterectomy (2004); Right knee arthroscopy (8/05); Right knee replacement (8/05); Carpal tunnel release; Shoulder surgery; Rectocele repair (05/2001); Liver biopsy (2002); Bladder repair; Rectal prolapse repair; and Back surgery.   Her family history includes Breast cancer in her mother and sister; Coronary artery disease in an other family member; Heart attack in an other family  member.She reports that she has never smoked. She has never used smokeless tobacco. She reports that she does not drink alcohol or use illicit drugs.  Current Outpatient Prescriptions on File Prior to Visit  Medication Sig Dispense Refill  . AMITIZA 24 MCG capsule TAKE ONE CAPSULE BY MOUTH TWICE A DAY 60 capsule 3  . aspirin EC 81 MG tablet Take by mouth. Take 81 mg by mouth daily.    . Calcium Carbonate-Vitamin D (CALCIUM 500 + D) 500-125 MG-UNIT TABS Take by mouth. Take 1 tablet by mouth every morning.    . chlorthalidone (HYGROTON) 25 MG tablet TAKE ONE TABLET BY MOUTH TWICE DAILY 60 tablet 4  . Cholecalciferol (VITAMIN D-3 PO) Take 1 capsule by mouth every morning.     . clonazePAM (KLONOPIN) 2 MG tablet TAKE 1 TABLET TWICE DAILY AS NEEDED FOR ANXIETY 60 tablet 1  . cyclobenzaprine (FLEXERIL) 10 MG tablet TAKE ONE TABLET BY MOUTH THREE TIMES DAILY AS NEEDED FOR MUSCLE SPASM 90 tablet 2  . ezetimibe-simvastatin (VYTORIN) 10-40 MG per tablet Take 1 tablet by mouth at bedtime. 30 tablet 4  . furosemide (LASIX) 20 MG tablet TAKE 1 TABLET DAILY AS NEEDED FOR SWELLING 30 tablet 4  . metoprolol succinate (TOPROL XL) 50 MG 24 hr tablet Take 1 tablet (50 mg total) by mouth 2 (two) times daily. Take with or immediately following a meal. 60 tablet 6  . omega-3 acid ethyl esters (LOVAZA) 1 G capsule Take 1 g by mouth 2 (two) times daily.    [name] omeprazole (PRILOSEC) 20 MG capsule TAKE ONE CAPSULE BY MOUTH TWICE A DAY 60 capsule 2  . potassium chloride SA (K-DUR,KLOR-CON) 20 MEQ tablet TAKE ONE (1) TABLET THREE (3) TIMES EACH DAY 90 tablet 2  . PREMARIN vaginal  cream APPLY AS DIRECTED 30 g 1  . RESTASIS 0.05 % ophthalmic emulsion PLACE 1 DROP IN BOTH EYES TWICE DAILY 60 each 4  . venlafaxine XR (EFFEXOR-XR) 150 MG 24 hr capsule TAKE ONE CAPSULE EACH MORNING 30 capsule 1  . VYTORIN 10-40 MG per tablet TAKE ONE (1) TABLET AT BEDTIME 30 tablet 4   No current facility-administered medications on file prior to  visit.    ROS Review of Systems  Constitutional: Negative for fever, chills, diaphoresis, appetite change, fatigue and unexpected weight change.  HENT: Negative for congestion, ear pain, hearing loss, postnasal drip, rhinorrhea, sneezing, sore throat and trouble swallowing.   Eyes: Negative for pain.  Respiratory: Negative for cough, chest tightness and shortness of breath.   Cardiovascular: Negative for chest pain and palpitations.  Gastrointestinal: Negative for nausea, vomiting, abdominal pain, diarrhea and constipation.  Genitourinary: Negative for dysuria, frequency and menstrual problem.  Musculoskeletal: Negative for joint swelling and arthralgias.  Skin: Negative for rash.  Neurological: Negative for dizziness, weakness, numbness and headaches.  Psychiatric/Behavioral: Negative for dysphoric mood and agitation.    Objective:  BP 133/81 mmHg  Pulse 75  Temp(Src) 97.8 F (36.6 C) (Oral)  Ht 5' 6"  (1.676 m)  Wt 230 lb 12.8 oz (104.69 kg)  BMI 37.27 kg/m2  BP Readings from Last 3 Encounters:  09/23/14 133/81  07/14/14 151/84  05/22/14 122/70    Wt Readings from Last 3 Encounters:  09/23/14 230 lb 12.8 oz (104.69 kg)  07/14/14 231 lb (104.781 kg)  05/22/14 224 lb (101.606 kg)     Physical Exam  Constitutional: She is oriented to person, place, and time. She appears well-developed and well-nourished. No distress.  HENT:  Head: Normocephalic and atraumatic.  Right Ear: External ear normal.  Left Ear: External ear normal.  Nose: Nose normal.  Mouth/Throat: Oropharynx is clear and moist.  Eyes: Conjunctivae and EOM are normal. Pupils are equal, round, and reactive to light.  Neck: Normal range of motion. Neck supple. No thyromegaly present.  Cardiovascular: Normal rate, regular rhythm and normal heart sounds.   No murmur heard. Pulmonary/Chest: Effort normal and breath sounds normal. No respiratory distress. She has no wheezes. She has no rales.  Abdominal: Soft.  Bowel sounds are normal. She exhibits no distension. There is no tenderness.  Musculoskeletal: She exhibits tenderness (painful mass at right Sternoclavicular joint measures 1 cm, raised. Possibly attached to bone).  Lymphadenopathy:    She has no cervical adenopathy.  Neurological: She is alert and oriented to person, place, and time. She has normal reflexes.  Skin: Skin is warm and dry.  Psychiatric: She has a normal mood and affect. Her behavior is normal. Judgment and thought content normal.    Lab Results  Component Value Date   HGBA1C 5.5 12/09/2013    Lab Results  Component Value Date   WBC 7.7 09/23/2014   HGB 14.6 09/23/2014   HCT 44.6 09/23/2014   PLT 163 06/20/2012   GLUCOSE 90 05/22/2014   CHOL 183 05/22/2014   TRIG 134 05/22/2014   HDL 70 05/22/2014   LDLCALC 86 05/22/2014   ALT 19 05/22/2014   AST 21 05/22/2014   NA 143 05/22/2014   K 3.4* 05/22/2014   CL 99 05/22/2014   CREATININE 1.01* 05/22/2014   BUN 20 05/22/2014   CO2 26 05/22/2014   TSH 3.980 07/18/2013   HGBA1C 5.5 12/09/2013   Results for orders placed or performed in visit on 09/23/14  POCT UA - Microscopic  Only  Result Value Ref Range   WBC, Ur, HPF, POC 40-80    RBC, urine, microscopic 5-8    Bacteria, U Microscopic mod    Mucus, UA negative    Epithelial cells, urine per micros moderate    Crystals, Ur, HPF, POC negative    Casts, Ur, LPF, POC negative    Yeast, UA negative   POCT urinalysis dipstick  Result Value Ref Range   Color, UA gold    Clarity, UA clear    Glucose, UA negative    Bilirubin, UA negative    Ketones, UA negative    Spec Grav, UA 1.015    Blood, UA small    pH, UA 7.0    Protein, UA 2+    Urobilinogen, UA negative    Nitrite, UA negative    Leukocytes, UA large (3+)   POCT CBC  Result Value Ref Range   WBC 7.7 4.6 - 10.2 K/uL   Lymph, poc 2.5 0.6 - 3.4   POC LYMPH PERCENT 32.8 10 - 50 %L   POC Granulocyte 4.6 2 - 6.9   Granulocyte percent 59.7 37 - 80  %G   RBC 5.08 4.04 - 5.48 M/uL   Hemoglobin 14.6 12.2 - 16.2 g/dL   HCT, POC 44.6 37.7 - 47.9 %   MCV 87.7 80 - 97 fL   MCH, POC 28.8 27 - 31.2 pg   MCHC 32.8 31.8 - 35.4 g/dL   RDW, POC 12.6 %   Platelet Count, POC 259 142 - 424 K/uL   MPV 7.0 0 - 99.8 fL    Mm Digital Screening Bilateral  07/14/2014   CLINICAL DATA:  Screening.  EXAM: DIGITAL SCREENING BILATERAL MAMMOGRAM WITH CAD  COMPARISON:  Previous exam(s).  ACR Breast Density Category b: There are scattered areas of fibroglandular density.  FINDINGS: There are no findings suspicious for malignancy. Images were processed with CAD.  IMPRESSION: No mammographic evidence of malignancy. A result letter of this screening mammogram will be mailed directly to the patient.  RECOMMENDATION: Screening mammogram in one year. (Code:[id number]-B-01Y)  BI-RADS CATEGORY  1: Negative.   Electronically Signed   By: Lajean Manes [name]   On: 07/14/2014 13:16    Assessment & Plan:   Ismael was seen today for hypertension, hyperlipidemia, hypothyroidism and vit d deficiency.  Diagnoses and all orders for this visit:  Hyperlipemia Orders: -     CMP14+EGFR; Standing -     NMR, lipoprofile -     Lipid panel; Standing -     CMP14+EGFR  Essential hypertension, benign Orders: -     POCT CBC; Standing -     CMP14+EGFR; Standing -     POCT CBC -     CMP14+EGFR  Vitamin D deficiency Orders: -     Vit D  25 hydroxy (rtn osteoporosis monitoring); Standing -     Vit D  25 hydroxy (rtn osteoporosis monitoring)  Other specified hypothyroidism Orders: -     Thyroid Panel With TSH  Dysuria Orders: -     POCT UA - Microscopic Only -     POCT urinalysis dipstick  Lesion of neck Orders: -     DG Clavicle Right; Future  Other orders -     Discontinue: levothyroxine (SYNTHROID, LEVOTHROID) 50 MCG tablet; Take 1 tablet (50 mcg total) by mouth every morning. -     levothyroxine (SYNTHROID, LEVOTHROID) 75 MCG tablet; Take 1 tablet (75 mcg total) by mouth  daily. -  nitrofurantoin, macrocrystal-monohydrate, (MACROBID) 100 MG capsule; Take 1 capsule (100 mg total) by mouth 2 (two) times daily.   I have discontinued Ms. Empie's levothyroxine, methylPREDNIsolone, and levothyroxine. I am also having her start on levothyroxine and nitrofurantoin (macrocrystal-monohydrate). Additionally, I am having her maintain her Cholecalciferol (VITAMIN D-3 PO), omega-3 acid ethyl esters, Calcium Carbonate-Vitamin D, aspirin EC, ezetimibe-simvastatin, PREMARIN, VYTORIN, RESTASIS, furosemide, chlorthalidone, cyclobenzaprine, potassium chloride SA, omeprazole, metoprolol succinate, clonazePAM, venlafaxine XR, and AMITIZA.  Meds ordered this encounter  Medications  . DISCONTD: levothyroxine (SYNTHROID, LEVOTHROID) 50 MCG tablet    Sig: Take 1 tablet (50 mcg total) by mouth every morning.    Dispense:  30 tablet    Refill:  2  . levothyroxine (SYNTHROID, LEVOTHROID) 75 MCG tablet    Sig: Take 1 tablet (75 mcg total) by mouth daily.    Dispense:  90 tablet    Refill:  3  . nitrofurantoin, macrocrystal-monohydrate, (MACROBID) 100 MG capsule    Sig: Take 1 capsule (100 mg total) by mouth 2 (two) times daily.    Dispense:  14 capsule    Refill:  0     Follow-up: Return in about 3 months (around 12/23/2014).  Claretta Fraise, [name]

## 2014-09-24 LAB — NMR, LIPOPROFILE
Cholesterol: 205 mg/dL — ABNORMAL HIGH (ref 100–199)
HDL Cholesterol by NMR: 74 mg/dL (ref >39)
HDL Particle Number: 49.9 umol/L (ref >=30.5)
LDL PARTICLE NUMBER: 1536 nmol/L — AB (ref <1000)
LDL SIZE: 20.7 nm (ref >20.5)
LDL-C: 97 mg/dL (ref 0–99)
LP-IR SCORE: 64 — AB (ref <=45)
Small LDL Particle Number: 613 nmol/L — ABNORMAL HIGH (ref <=527)
Triglycerides by NMR: 168 mg/dL — ABNORMAL HIGH (ref 0–149)

## 2014-09-24 LAB — CMP14+EGFR
A/G RATIO: 1.7 (ref 1.1–2.5)
ALK PHOS: 81 IU/L (ref 39–117)
ALT: 19 IU/L (ref 0–32)
AST: 20 IU/L (ref 0–40)
Albumin: 4.4 g/dL (ref 3.6–4.8)
BILIRUBIN TOTAL: 0.4 mg/dL (ref 0.0–1.2)
BUN / CREAT RATIO: 21 (ref 11–26)
BUN: 20 mg/dL (ref 8–27)
CO2: 26 mmol/L (ref 18–29)
Calcium: 9.9 mg/dL (ref 8.7–10.3)
Chloride: 99 mmol/L (ref 97–108)
Creatinine, Ser: 0.96 mg/dL (ref 0.57–1.00)
GFR calc non Af Amer: 63 mL/min/{1.73_m2} (ref >59)
GFR, EST AFRICAN AMERICAN: 73 mL/min/{1.73_m2} (ref >59)
GLOBULIN, TOTAL: 2.6 g/dL (ref 1.5–4.5)
Glucose: 105 mg/dL — ABNORMAL HIGH (ref 65–99)
Potassium: 3.4 mmol/L — ABNORMAL LOW (ref 3.5–5.2)
Sodium: 141 mmol/L (ref 134–144)
Total Protein: 7 g/dL (ref 6.0–8.5)

## 2014-09-24 LAB — VITAMIN D 25 HYDROXY (VIT D DEFICIENCY, FRACTURES): Vit D, 25-Hydroxy: 29.6 ng/mL — ABNORMAL LOW (ref 30.0–100.0)

## 2014-09-24 LAB — THYROID PANEL WITH TSH
Free Thyroxine Index: 2.4 (ref 1.2–4.9)
T3 Uptake Ratio: 25 % (ref 24–39)
T4, Total: 9.7 ug/dL (ref 4.5–12.0)
TSH: 2.88 u[IU]/mL (ref 0.450–4.500)

## 2014-09-25 ENCOUNTER — Other Ambulatory Visit: Payer: Self-pay | Admitting: Family Medicine

## 2014-09-25 LAB — URINE CULTURE

## 2014-09-25 MED ORDER — AMOXICILLIN 875 MG PO TABS: 875.0000 mg | ORAL_TABLET | Freq: Two times a day (BID) | ORAL | Status: DC

## 2014-09-25 MED ORDER — ATORVASTATIN CALCIUM 80 MG PO TABS: 80.0000 mg | ORAL_TABLET | Freq: Every day | ORAL | Status: DC

## 2014-09-25 MED ORDER — VITAMIN D (ERGOCALCIFEROL) 1.25 MG (50000 UNIT) PO CAPS: 50000.0000 [IU] | ORAL_CAPSULE | ORAL | Status: DC

## 2014-09-25 MED ORDER — EZETIMIBE 10 MG PO TABS: 10.0000 mg | ORAL_TABLET | Freq: Every day | ORAL | Status: DC

## 2014-09-28 ENCOUNTER — Telehealth: Payer: Self-pay | Admitting: Family Medicine

## 2014-09-28 NOTE — Telephone Encounter (Signed)
Lmtcb for 2 result notes

## 2014-09-28 NOTE — Progress Notes (Signed)
lmtcb

## 2014-10-01 ENCOUNTER — Telehealth: Payer: Self-pay | Admitting: Family Medicine

## 2014-10-02 NOTE — Telephone Encounter (Signed)
Labs sent to patient and patient aware that the zetia pa is still pending.

## 2014-10-05 ENCOUNTER — Telehealth: Payer: Self-pay | Admitting: Family Medicine

## 2014-10-05 DIAGNOSIS — L989 Disorder of the skin and subcutaneous tissue, unspecified: Secondary | ICD-10-CM

## 2014-10-06 ENCOUNTER — Telehealth: Payer: Self-pay

## 2014-10-06 NOTE — Telephone Encounter (Signed)
Pt aware of taking increased atorvastatin, zetia not covered

## 2014-10-06 NOTE — Telephone Encounter (Signed)
Please refer as requested Thanks, WS 

## 2014-10-06 NOTE — Telephone Encounter (Signed)
lmtcb

## 2014-10-06 NOTE — Telephone Encounter (Signed)
Zetia is not a preferred med for Medicaid   Would you consider changing to preferred : Atorvastatin, lovastatin, pravastatin or simvastatin?  Please advise

## 2014-10-06 NOTE — Telephone Encounter (Signed)
Have her take the atorvastatin 80 mg daily and recheck in 30 days. That prescription was previously sent to her pharmacy

## 2014-10-13 ENCOUNTER — Other Ambulatory Visit: Payer: Self-pay | Admitting: Family Medicine

## 2014-10-19 ENCOUNTER — Other Ambulatory Visit: Payer: Self-pay | Admitting: Family Medicine

## 2014-10-26 ENCOUNTER — Encounter: Payer: Self-pay | Admitting: Physician Assistant

## 2014-10-26 ENCOUNTER — Ambulatory Visit: Payer: Medicaid Other | Admitting: Physician Assistant

## 2014-10-26 ENCOUNTER — Ambulatory Visit (INDEPENDENT_AMBULATORY_CARE_PROVIDER_SITE_OTHER): Payer: Medicaid Other | Admitting: Physician Assistant

## 2014-10-26 VITALS — BP 148/86 | HR 116 | Temp 98.6°F | Ht 66.0 in | Wt 226.0 lb

## 2014-10-26 DIAGNOSIS — R899 Unspecified abnormal finding in specimens from other organs, systems and tissues: Secondary | ICD-10-CM

## 2014-10-26 DIAGNOSIS — R222 Localized swelling, mass and lump, trunk: Secondary | ICD-10-CM | POA: Diagnosis not present

## 2014-10-26 DIAGNOSIS — A499 Bacterial infection, unspecified: Secondary | ICD-10-CM | POA: Diagnosis not present

## 2014-10-26 DIAGNOSIS — R3915 Urgency of urination: Secondary | ICD-10-CM | POA: Diagnosis not present

## 2014-10-26 DIAGNOSIS — N3090 Cystitis, unspecified without hematuria: Secondary | ICD-10-CM

## 2014-10-26 DIAGNOSIS — B373 Candidiasis of vulva and vagina: Secondary | ICD-10-CM

## 2014-10-26 DIAGNOSIS — N760 Acute vaginitis: Secondary | ICD-10-CM

## 2014-10-26 DIAGNOSIS — B9689 Other specified bacterial agents as the cause of diseases classified elsewhere: Secondary | ICD-10-CM

## 2014-10-26 DIAGNOSIS — B3731 Acute candidiasis of vulva and vagina: Secondary | ICD-10-CM

## 2014-10-26 LAB — POCT UA - MICROSCOPIC ONLY
Casts, Ur, LPF, POC: NEGATIVE
Crystals, Ur, HPF, POC: NEGATIVE

## 2014-10-26 LAB — POCT CBC
Granulocyte percent: 58.6 %G (ref 37–80)
HCT, POC: 43.8 % (ref 37.7–47.9)
HEMOGLOBIN: 14.7 g/dL (ref 12.2–16.2)
Lymph, poc: 2.9 (ref 0.6–3.4)
MCH, POC: 29.5 pg (ref 27–31.2)
MCHC: 33.5 g/dL (ref 31.8–35.4)
MCV: 87.8 fL (ref 80–97)
MPV: 7.4 fL (ref 0–99.8)
POC Granulocyte: 5.3 (ref 2–6.9)
POC LYMPH PERCENT: 31.9 %L (ref 10–50)
Platelet Count, POC: 251 10*3/uL (ref 142–424)
RBC: 4.99 M/uL (ref 4.04–5.48)
RDW, POC: 12.4 %
WBC: 9.1 10*3/uL (ref 4.6–10.2)

## 2014-10-26 LAB — POCT URINALYSIS DIPSTICK
Bilirubin, UA: NEGATIVE
Blood, UA: NEGATIVE
GLUCOSE UA: NEGATIVE
Ketones, UA: NEGATIVE
NITRITE UA: POSITIVE
Spec Grav, UA: 1.02
Urobilinogen, UA: NEGATIVE
pH, UA: 6

## 2014-10-26 LAB — POCT WET PREP (WET MOUNT): KOH WET PREP POC: POSITIVE

## 2014-10-26 MED ORDER — CIPROFLOXACIN HCL 500 MG PO TABS: 500.0000 mg | ORAL_TABLET | Freq: Two times a day (BID) | ORAL | Status: DC

## 2014-10-26 MED ORDER — METRONIDAZOLE 500 MG PO TABS
500.0000 mg | ORAL_TABLET | Freq: Two times a day (BID) | ORAL | Status: DC
Start: 2014-10-26 — End: 2014-12-22

## 2014-10-26 MED ORDER — FLUCONAZOLE 150 MG PO TABS: ORAL_TABLET | ORAL | Status: DC

## 2014-10-26 NOTE — Addendum Note (Signed)
Addended by: Tommas OlpHANDY, Kabao Leite N on: 10/26/2014 05:34 PM   Modules accepted: Orders

## 2014-10-26 NOTE — Progress Notes (Signed)
   Subjective:    Patient ID: [name], female    DOB: [birth year], 64 y.o.   MRN: [id number]  HPI 63 y/o female presents with c/o "knot" on right clavicle x 3 months. Painful. Enlarging.   She also has c/o foul odor with her urine and would like her urine checked for uti.     Review of Systems  Constitutional: Negative.   HENT: Negative.   Gastrointestinal: Positive for abdominal pain.  Genitourinary: Positive for urgency, frequency and vaginal discharge (brown on panty liner ).       Foul odor of urine   Skin:       "knot" on right clavicle, enlarging, intermittent pain        Objective:   Physical Exam  Constitutional: She is oriented to person, place, and time. She appears well-developed and well-nourished.  Pulmonary/Chest:  Mass on right chest, supraclavicular at sternoclavicular joint, TTP, slightly mobile    Abdominal: Soft. There is tenderness.  Neurological: She is alert and oriented to person, place, and time.  Psychiatric: She has a normal mood and affect. Her behavior is normal. Judgment and thought content normal.  Nursing note and vitals reviewed.         Assessment & Plan:  1. Vaginitis and vulvovaginitis  - POCT Wet Prep ([place]) - ciprofloxacin (CIPRO) 500 MG tablet; Take 1 tablet (500 mg total) by mouth 2 (two) times daily.  Dispense: 20 tablet; Refill: 0 - fluconazole (DIFLUCAN) 150 MG tablet; One tablet on day 1. Repeat in 3 days  Dispense: 2 tablet; Refill: 0 - metroNIDAZOLE (FLAGYL) 500 MG tablet; Take 1 tablet (500 mg total) by mouth 2 (two) times daily.  Dispense: 14 tablet; Refill: 0  2. Urinary urgency  - POCT urinalysis dipstick - POCT UA - Microscopic Only  3. Abnormal laboratory test result  - NMR, lipoprofile  4. Mass in chest  - CT Chest W Contrast; Future - POCT CBC - CMP14+EGFR  5. BV (bacterial vaginosis)  - metroNIDAZOLE (FLAGYL) 500 MG tablet; Take 1 tablet (500 mg total) by mouth 2 (two) times daily.  Dispense:  14 tablet; Refill: 0  6. Vulvovaginal candidiasis  - fluconazole (DIFLUCAN) 150 MG tablet; One tablet on day 1. Repeat in 3 days  Dispense: 2 tablet; Refill: 0 - Advised patient to Stop taking statin while taking this medication   7. Cystitis  - ciprofloxacin (CIPRO) 500 MG tablet; Take 1 tablet (500 mg total) by mouth 2 (two) times daily.  Dispense: 20 tablet; Refill: 0   Continue all meds Labs pending Health Maintenance reviewed Diet and exercise encouraged RTO 2 weeks   Dymon Summerhill A. Benjamin Stain PA-C

## 2014-10-27 LAB — CMP14+EGFR
A/G RATIO: 1.6 (ref 1.1–2.5)
ALT: 19 IU/L (ref 0–32)
AST: 18 IU/L (ref 0–40)
Albumin: 4.2 g/dL (ref 3.6–4.8)
Alkaline Phosphatase: 92 IU/L (ref 39–117)
BUN/Creatinine Ratio: 20 (ref 11–26)
BUN: 21 mg/dL (ref 8–27)
Bilirubin Total: 0.6 mg/dL (ref 0.0–1.2)
CHLORIDE: 96 mmol/L — AB (ref 97–108)
CO2: 28 mmol/L (ref 18–29)
CREATININE: 1.06 mg/dL — AB (ref 0.57–1.00)
Calcium: 10.4 mg/dL — ABNORMAL HIGH (ref 8.7–10.3)
GFR, EST AFRICAN AMERICAN: 65 mL/min/{1.73_m2} (ref >59)
GFR, EST NON AFRICAN AMERICAN: 56 mL/min/{1.73_m2} — AB (ref >59)
GLUCOSE: 93 mg/dL (ref 65–99)
Globulin, Total: 2.6 g/dL (ref 1.5–4.5)
Potassium: 3.5 mmol/L (ref 3.5–5.2)
Sodium: 141 mmol/L (ref 134–144)
TOTAL PROTEIN: 6.8 g/dL (ref 6.0–8.5)

## 2014-10-27 LAB — NMR, LIPOPROFILE
Cholesterol: 179 mg/dL (ref 100–199)
HDL Cholesterol by NMR: 72 mg/dL (ref >39)
HDL Particle Number: 43.6 umol/L (ref >=30.5)
LDL PARTICLE NUMBER: 1104 nmol/L — AB (ref <1000)
LDL Size: 21 nm (ref >20.5)
LDL-C: 83 mg/dL (ref 0–99)
LP-IR Score: 43 (ref <=45)
Small LDL Particle Number: 350 nmol/L (ref <=527)
Triglycerides by NMR: 122 mg/dL (ref 0–149)

## 2014-10-28 LAB — URINE CULTURE

## 2014-11-02 ENCOUNTER — Ambulatory Visit (HOSPITAL_COMMUNITY)
Admission: RE | Admit: 2014-11-02 | Discharge: 2014-11-02 | Disposition: A | Discharge status: Home / Self Care | Payer: Medicaid Other | Source: Ambulatory Visit | Attending: Physician Assistant | Admitting: Physician Assistant

## 2014-11-02 DIAGNOSIS — R222 Localized swelling, mass and lump, trunk: Secondary | ICD-10-CM | POA: Diagnosis not present

## 2014-11-02 DIAGNOSIS — I10 Essential (primary) hypertension: Secondary | ICD-10-CM | POA: Diagnosis not present

## 2014-11-02 DIAGNOSIS — J449 Chronic obstructive pulmonary disease, unspecified: Secondary | ICD-10-CM | POA: Insufficient documentation

## 2014-11-02 MED ORDER — IOHEXOL 300 MG/ML  SOLN
80.0000 mL | Freq: Once | INTRAMUSCULAR | Status: AC | PRN
  Administered 2014-11-02: 80 mL via INTRAVENOUS

## 2014-11-02 NOTE — Telephone Encounter (Signed)
-----   Message from Chelsea Primusiffany A Gann, PA-C sent at 11/01/2014 10:59 PM EDT ----- Continue Cipro and keep followup.   Tiffany A. Chauncey ReadingGann PA-C

## 2014-11-03 ENCOUNTER — Telehealth: Payer: Self-pay | Admitting: Family Medicine

## 2014-11-03 NOTE — Telephone Encounter (Signed)
Patient aware of lab results and is requesting ct results

## 2014-11-13 ENCOUNTER — Other Ambulatory Visit: Payer: Self-pay | Admitting: Family Medicine

## 2014-11-13 ENCOUNTER — Other Ambulatory Visit: Payer: Self-pay | Admitting: Nurse Practitioner

## 2014-11-13 NOTE — Telephone Encounter (Signed)
Seen 4/16- stacks

## 2014-11-13 NOTE — Telephone Encounter (Signed)
Stacks seen 09/23/14. Nurse will need to phone in

## 2014-11-16 ENCOUNTER — Other Ambulatory Visit: Payer: Self-pay | Admitting: *Deleted

## 2014-11-16 MED ORDER — CHLORTHALIDONE 25 MG PO TABS: 25.0000 mg | ORAL_TABLET | Freq: Two times a day (BID) | ORAL | Status: DC

## 2014-11-16 MED ORDER — FUROSEMIDE 20 MG PO TABS: ORAL_TABLET | ORAL | Status: DC

## 2014-11-16 MED ORDER — CYCLOSPORINE 0.05 % OP EMUL: OPHTHALMIC | Status: DC

## 2014-11-27 ENCOUNTER — Other Ambulatory Visit (INDEPENDENT_AMBULATORY_CARE_PROVIDER_SITE_OTHER): Payer: Medicaid Other

## 2014-11-27 DIAGNOSIS — E559 Vitamin D deficiency, unspecified: Secondary | ICD-10-CM

## 2014-11-27 DIAGNOSIS — E785 Hyperlipidemia, unspecified: Secondary | ICD-10-CM

## 2014-11-27 DIAGNOSIS — I10 Essential (primary) hypertension: Secondary | ICD-10-CM

## 2014-11-27 LAB — POCT CBC
Granulocyte percent: 56.6 %G (ref 37–80)
HCT, POC: 45.7 % (ref 37.7–47.9)
Hemoglobin: 14.5 g/dL (ref 12.2–16.2)
Lymph, poc: 2.7 (ref 0.6–3.4)
MCH: 28 pg (ref 27–31.2)
MCHC: 31.7 g/dL — AB (ref 31.8–35.4)
MCV: 88.2 fL (ref 80–97)
MPV: 7.3 fL (ref 0–99.8)
POC GRANULOCYTE: 4.4 (ref 2–6.9)
POC LYMPH %: 35.2 % (ref 10–50)
Platelet Count, POC: 273 10*3/uL (ref 142–424)
RBC: 5.18 M/uL (ref 4.04–5.48)
RDW, POC: 12.9 %
WBC: 7.8 10*3/uL (ref 4.6–10.2)

## 2014-11-28 LAB — LIPID PANEL
CHOL/HDL RATIO: 2.7 ratio (ref 0.0–4.4)
Cholesterol, Total: 190 mg/dL (ref 100–199)
HDL: 70 mg/dL (ref >39)
LDL CALC: 92 mg/dL (ref 0–99)
Triglycerides: 142 mg/dL (ref 0–149)
VLDL Cholesterol Cal: 28 mg/dL (ref 5–40)

## 2014-11-28 LAB — CMP14+EGFR
A/G RATIO: 1.6 (ref 1.1–2.5)
ALBUMIN: 4.2 g/dL (ref 3.6–4.8)
ALT: 24 IU/L (ref 0–32)
AST: 26 IU/L (ref 0–40)
Alkaline Phosphatase: 92 IU/L (ref 39–117)
BILIRUBIN TOTAL: 0.6 mg/dL (ref 0.0–1.2)
BUN / CREAT RATIO: 22 (ref 11–26)
BUN: 22 mg/dL (ref 8–27)
CHLORIDE: 98 mmol/L (ref 97–108)
CO2: 26 mmol/L (ref 18–29)
Calcium: 10.1 mg/dL (ref 8.7–10.3)
Creatinine, Ser: 1 mg/dL (ref 0.57–1.00)
GFR calc Af Amer: 69 mL/min/{1.73_m2} (ref >59)
GFR calc non Af Amer: 60 mL/min/{1.73_m2} (ref >59)
Globulin, Total: 2.7 g/dL (ref 1.5–4.5)
Glucose: 109 mg/dL — ABNORMAL HIGH (ref 65–99)
Potassium: 3.4 mmol/L — ABNORMAL LOW (ref 3.5–5.2)
SODIUM: 141 mmol/L (ref 134–144)
Total Protein: 6.9 g/dL (ref 6.0–8.5)

## 2014-11-28 LAB — VITAMIN D 25 HYDROXY (VIT D DEFICIENCY, FRACTURES): Vit D, 25-Hydroxy: 49 ng/mL (ref 30.0–100.0)

## 2014-11-30 ENCOUNTER — Ambulatory Visit: Payer: Medicaid Other | Admitting: Family Medicine

## 2014-12-01 ENCOUNTER — Telehealth: Payer: Self-pay | Admitting: Family Medicine

## 2014-12-01 NOTE — Telephone Encounter (Signed)
Pt notified she will not need labs Verbalizes understanding

## 2014-12-01 NOTE — Telephone Encounter (Signed)
Spoke with [name]- advised her of normal lab results per Dr. Darlyn Read.  She has a follow up appointment with him 12/22/14, she states she is supposed to have her labs drawn before that appointment.  Will route to [place] to enter lab orders.

## 2014-12-11 ENCOUNTER — Other Ambulatory Visit: Payer: Self-pay | Admitting: Family Medicine

## 2014-12-11 NOTE — Telephone Encounter (Signed)
rx called into pharmacy

## 2014-12-11 NOTE — Telephone Encounter (Signed)
Please call in klonopin with 1 refills 

## 2014-12-11 NOTE — Telephone Encounter (Signed)
Last seen 10/26/14 Tiffany  Last Vit D 6/17  49.0

## 2014-12-17 ENCOUNTER — Telehealth: Payer: Self-pay | Admitting: Family Medicine

## 2014-12-17 NOTE — Telephone Encounter (Signed)
Spoke with pt regarding labs Verbalizes understanding 

## 2014-12-18 ENCOUNTER — Other Ambulatory Visit: Payer: Self-pay

## 2014-12-22 ENCOUNTER — Encounter: Payer: Self-pay | Admitting: Family Medicine

## 2014-12-22 ENCOUNTER — Ambulatory Visit (INDEPENDENT_AMBULATORY_CARE_PROVIDER_SITE_OTHER): Payer: Medicaid Other | Admitting: Family Medicine

## 2014-12-22 VITALS — BP 141/85 | HR 115 | Temp 97.5°F | Ht 66.0 in | Wt 226.4 lb

## 2014-12-22 DIAGNOSIS — R5382 Chronic fatigue, unspecified: Secondary | ICD-10-CM | POA: Diagnosis not present

## 2014-12-22 DIAGNOSIS — E038 Other specified hypothyroidism: Secondary | ICD-10-CM | POA: Diagnosis not present

## 2014-12-22 DIAGNOSIS — I10 Essential (primary) hypertension: Secondary | ICD-10-CM

## 2014-12-22 DIAGNOSIS — E785 Hyperlipidemia, unspecified: Secondary | ICD-10-CM

## 2014-12-22 DIAGNOSIS — M898X8 Other specified disorders of bone, other site: Secondary | ICD-10-CM

## 2014-12-22 DIAGNOSIS — M89319 Hypertrophy of bone, unspecified shoulder: Secondary | ICD-10-CM

## 2014-12-22 DIAGNOSIS — M25511 Pain in right shoulder: Secondary | ICD-10-CM | POA: Diagnosis not present

## 2014-12-22 LAB — POCT CBC
Granulocyte percent: 54.6 %G (ref 37–80)
HCT, POC: 44.4 % (ref 37.7–47.9)
HEMOGLOBIN: 14.2 g/dL (ref 12.2–16.2)
Lymph, poc: 3.3 (ref 0.6–3.4)
MCH: 27.6 pg (ref 27–31.2)
MCHC: 32.1 g/dL (ref 31.8–35.4)
MCV: 86 fL (ref 80–97)
MPV: 6.9 fL (ref 0–99.8)
POC Granulocyte: 5 (ref 2–6.9)
POC LYMPH PERCENT: 36.7 %L (ref 10–50)
Platelet Count, POC: 273 10*3/uL (ref 142–424)
RBC: 5.16 M/uL (ref 4.04–5.48)
RDW, POC: 12.6 %
WBC: 9.1 10*3/uL (ref 4.6–10.2)

## 2014-12-22 NOTE — Patient Instructions (Signed)
DASH Eating Plan °DASH stands for "Dietary Approaches to Stop Hypertension." The DASH eating plan is a healthy eating plan that has been shown to reduce high blood pressure (hypertension). Additional health benefits may include reducing the risk of type 2 diabetes mellitus, heart disease, and stroke. The DASH eating plan may also help with weight loss. °WHAT DO I NEED TO KNOW ABOUT THE DASH EATING PLAN? °For the DASH eating plan, you will follow these general guidelines: °· Choose foods with a percent daily value for sodium of less than 5% (as listed on the food label). °· Use salt-free seasonings or herbs instead of table salt or sea salt. °· Check with your health care provider or pharmacist before using salt substitutes. °· Eat lower-sodium products, often labeled as "lower sodium" or "no salt added." °· Eat fresh foods. °· Eat more vegetables, fruits, and low-fat dairy products. °· Choose whole grains. Look for the word "whole" as the first word in the ingredient list. °· Choose fish and skinless chicken or turkey more often than red meat. Limit fish, poultry, and meat to 6 oz (170 g) each day. °· Limit sweets, desserts, sugars, and sugary drinks. °· Choose heart-healthy fats. °· Limit cheese to 1 oz (28 g) per day. °· Eat more home-cooked food and less restaurant, buffet, and fast food. °· Limit fried foods. °· Cook foods using methods other than frying. °· Limit canned vegetables. If you do use them, rinse them well to decrease the sodium. °· When eating at a restaurant, ask that your food be prepared with less salt, or no salt if possible. °WHAT FOODS CAN I EAT? °Seek help from a dietitian for individual calorie needs. °Grains °Whole grain or whole wheat bread. Brown rice. Whole grain or whole wheat pasta. Quinoa, bulgur, and whole grain cereals. Low-sodium cereals. Corn or whole wheat flour tortillas. Whole grain cornbread. Whole grain crackers. Low-sodium crackers. °Vegetables °Fresh or frozen vegetables  (raw, steamed, roasted, or grilled). Low-sodium or reduced-sodium tomato and vegetable juices. Low-sodium or reduced-sodium tomato sauce and paste. Low-sodium or reduced-sodium canned vegetables.  °Fruits °All fresh, canned (in natural juice), or frozen fruits. °Meat and Other Protein Products °Ground beef (85% or leaner), grass-fed beef, or beef trimmed of fat. Skinless chicken or turkey. Ground chicken or turkey. Pork trimmed of fat. All fish and seafood. Eggs. Dried beans, peas, or lentils. Unsalted nuts and seeds. Unsalted canned beans. °Dairy °Low-fat dairy products, such as skim or 1% milk, 2% or reduced-fat cheeses, low-fat ricotta or cottage cheese, or plain low-fat yogurt. Low-sodium or reduced-sodium cheeses. °Fats and Oils °Tub margarines without trans fats. Light or reduced-fat mayonnaise and salad dressings (reduced sodium). Avocado. Safflower, olive, or canola oils. Natural peanut or almond butter. °Other °Unsalted popcorn and pretzels. °The items listed above may not be a complete list of recommended foods or beverages. Contact your dietitian for more options. °WHAT FOODS ARE NOT RECOMMENDED? °Grains °White bread. White pasta. White rice. Refined cornbread. Bagels and croissants. Crackers that contain trans fat. °Vegetables °Creamed or fried vegetables. Vegetables in a cheese sauce. Regular canned vegetables. Regular canned tomato sauce and paste. Regular tomato and vegetable juices. °Fruits °Dried fruits. Canned fruit in light or heavy syrup. Fruit juice. °Meat and Other Protein Products °Fatty cuts of meat. Ribs, chicken wings, bacon, sausage, bologna, salami, chitterlings, fatback, hot dogs, bratwurst, and packaged luncheon meats. Salted nuts and seeds. Canned beans with salt. °Dairy °Whole or 2% milk, cream, half-and-half, and cream cheese. Whole-fat or sweetened yogurt. Full-fat   cheeses or blue cheese. Nondairy creamers and whipped toppings. Processed cheese, cheese spreads, or cheese  curds. °Condiments °Onion and garlic salt, seasoned salt, table salt, and sea salt. Canned and packaged gravies. Worcestershire sauce. Tartar sauce. Barbecue sauce. Teriyaki sauce. Soy sauce, including reduced sodium. Steak sauce. Fish sauce. Oyster sauce. Cocktail sauce. Horseradish. Ketchup and mustard. Meat flavorings and tenderizers. Bouillon cubes. Hot sauce. Tabasco sauce. Marinades. Taco seasonings. Relishes. °Fats and Oils °Butter, stick margarine, lard, shortening, ghee, and bacon fat. Coconut, palm kernel, or palm oils. Regular salad dressings. °Other °Pickles and olives. Salted popcorn and pretzels. °The items listed above may not be a complete list of foods and beverages to avoid. Contact your dietitian for more information. °WHERE CAN I FIND MORE INFORMATION? °National [employer], [employer], [employer]: www.nhlbi.nih.gov/health/health-topics/topics/dash/ °Document Released: 05/18/2011 Document Revised: 10/13/2013 Document Reviewed: 04/02/2013 °Exit[employer]® Patient Information ©2015 [employer]. This information is not intended to replace advice given to you by your health care provider. Make sure you discuss any questions you have with your health care provider. ° °

## 2014-12-22 NOTE — Progress Notes (Signed)
Subjective:  Patient ID: [name], female    DOB: August 31, 1950  Age: 64 y.o. MRN: [id number]  CC: Hyperlipidemia   HPI [place] Joswick presents for  follow-up of hypertension. Patient has no history of headache chest pain or shortness of breath or recent cough. Patient also denies symptoms of TIA such as numbness or weakness that is lateralizing. Patient checks  blood pressure at home and has not had any elevated readings recently. Patient denies side effects from his medication. States taking it regularly.  Patient also  in for follow-up of elevated cholesterol. Doing well without complaints on current medication. Denies side effects of statin including myalgia and arthralgia and nausea. Also in today for liver function testing. Currently no chest pain, shortness of breath or other cardiovascular related symptoms noted.   Reports Generalized weakness and fatigue. Nonfocal. Ongoing for years with recent worsening over several weeks time. Interferes with enjoyment of life, but functional for ADLs.[name]  History Edmund has a past medical history of Borderline hypertension; Obesity; Hyperlipidemia; Fe deficiency anemia; Hiatal hernia; PSVT (paroxysmal supraventricular tachycardia) (8/05); Recurrent cystitis; Chronic lower back pain; Lumbar herniated disc; History of mixed drug abuse; Anxiety; Arthritis; Depression; History of cardiac catheterization; Heartburn; Microscopic hematuria; Personal history of venous thrombosis and embolism; Hypothyroidism; and Chronic shoulder pain.   She has past surgical history that includes Appendectomy (2004?or 11/03?); Partial hysterectomy (2004); Right knee arthroscopy (8/05); Right knee replacement (8/05); Carpal tunnel release; Shoulder surgery; Rectocele repair (05/2001); Liver biopsy (2002); Bladder repair; Rectal prolapse repair; and Back surgery.   Her family history includes Breast cancer in her mother and sister; Coronary artery disease in an other family  member; Heart attack in an other family member.She reports that she has never smoked. She has never used smokeless tobacco. She reports that she does not drink alcohol or use illicit drugs.  Current Outpatient Prescriptions on File Prior to Visit  Medication Sig Dispense Refill  . AMITIZA 24 MCG capsule TAKE ONE CAPSULE BY MOUTH TWICE A DAY 60 capsule 3  . aspirin EC 81 MG tablet Take by mouth. Take 81 mg by mouth daily.    [name] atorvastatin (LIPITOR) 80 MG tablet Take 1 tablet (80 mg total) by mouth daily. 90 tablet 3  . Calcium Carbonate-Vitamin D (CALCIUM 500 + D) 500-125 MG-UNIT TABS Take by mouth. Take 1 tablet by mouth every morning.    . chlorthalidone (HYGROTON) 25 MG tablet Take 1 tablet (25 mg total) by mouth 2 (two) times daily. 60 tablet 3  . Cholecalciferol (VITAMIN D-3 PO) Take 1 capsule by mouth every morning.     . clonazePAM (KLONOPIN) 2 MG tablet TAKE 1 TABLET TWICE DAILY AS NEEDED FOR ANXIETY 60 tablet 1  . cyclobenzaprine (FLEXERIL) 10 MG tablet TAKE ONE TABLET BY MOUTH THREE TIMES DAILY AS NEEDED FOR MUSCLE SPASM 90 tablet 1  . cycloSPORINE (RESTASIS) 0.05 % ophthalmic emulsion PLACE 1 DROP IN BOTH EYES TWICE DAILY 60 each 2  . furosemide (LASIX) 20 MG tablet TAKE 1 TABLET DAILY AS NEEDED FOR SWELLING 30 tablet 4  . levothyroxine (SYNTHROID, LEVOTHROID) 75 MCG tablet Take 1 tablet (75 mcg total) by mouth daily. 90 tablet 3  . metoprolol succinate (TOPROL XL) 50 MG 24 hr tablet Take 1 tablet (50 mg total) by mouth 2 (two) times daily. Take with or immediately following a meal. 60 tablet 6  . omega-3 acid ethyl esters (LOVAZA) 1 G capsule Take 1 g by mouth 2 (two) times daily.    [name]  omeprazole (PRILOSEC) 20 MG capsule TAKE ONE CAPSULE BY MOUTH TWICE A DAY 60 capsule 5  . PREMARIN vaginal cream APPLY AS DIRECTED 30 g 1  . venlafaxine XR (EFFEXOR-XR) 150 MG 24 hr capsule TAKE ONE CAPSULE EACH MORNING 30 capsule 2  . Vitamin D, Ergocalciferol, (DRISDOL) 50000 UNITS CAPS capsule TAKE 1  CAPSULE TWICE A WEEK 16 capsule 0   No current facility-administered medications on file prior to visit.    ROS Review of Systems  Constitutional: Positive for fatigue. Negative for fever, chills, diaphoresis, appetite change and unexpected weight change.  HENT: Negative for congestion, ear pain, hearing loss, postnasal drip, rhinorrhea, sneezing, sore throat and trouble swallowing.   Eyes: Negative for pain.  Respiratory: Negative for cough, chest tightness and shortness of breath.   Cardiovascular: Negative for chest pain and palpitations.  Gastrointestinal: Negative for nausea, vomiting, abdominal pain, diarrhea and constipation.  Genitourinary: Negative for dysuria, frequency and menstrual problem.  Musculoskeletal: Positive for myalgias and arthralgias (right shoulder pain at [facility] region anteriorly. ). Negative for joint swelling.       Joint deformity at medial border of right clavicle is enlarged, but not painful.  Skin: Negative for rash.  Neurological: Positive for weakness. Negative for dizziness, numbness and headaches.  Psychiatric/Behavioral: Negative for dysphoric mood and agitation.    Objective:  BP 141/85 mmHg  Pulse 115  Temp(Src) 97.5 F (36.4 C) (Oral)  Ht 5' 6"  (1.676 m)  Wt 226 lb 6.4 oz (102.694 kg)  BMI 36.56 kg/m2  BP Readings from Last 3 Encounters:  12/22/14 141/85  10/26/14 148/86  09/23/14 133/81    Wt Readings from Last 3 Encounters:  12/22/14 226 lb 6.4 oz (102.694 kg)  10/26/14 226 lb (102.513 kg)  09/23/14 230 lb 12.8 oz (104.69 kg)     Physical Exam  Lab Results  Component Value Date   HGBA1C 5.5 12/09/2013    Lab Results  Component Value Date   WBC 9.1 12/22/2014   HGB 14.2 12/22/2014   HCT 44.4 12/22/2014   PLT 163 06/20/2012   GLUCOSE 97 12/22/2014   CHOL 177 12/22/2014   TRIG 144 12/22/2014   HDL 71 12/22/2014   LDLCALC 77 12/22/2014   ALT 21 12/22/2014   AST 23 12/22/2014   NA 140 12/22/2014   K 3.1* 12/22/2014   CL  96* 12/22/2014   CREATININE 1.02* 12/22/2014   BUN 19 12/22/2014   CO2 27 12/22/2014   TSH 1.640 12/22/2014   HGBA1C 5.5 12/09/2013    Ct Chest W Contrast  11/02/2014   CLINICAL DATA:  The patient reports a mass on the right clavicle for 6 months. Area is tender. History of hypertension and COPD.  EXAM: CT CHEST WITH CONTRAST  TECHNIQUE: Multidetector CT imaging of the chest was performed during intravenous contrast administration.  CONTRAST:  4m OMNIPAQUE IOHEXOL 300 MG/ML  SOLN  COMPARISON:  09/23/2014  FINDINGS: Heart: Heart size is normal. There is coronary artery calcification. No pericardial effusion.  Vascular structures: Mild atherosclerotic calcification of the thoracic aorta. No aneurysm.  Mediastinum/thyroid: Within the left lobe of the thyroid gland there is a 6 mm nodule. Within the right lobe of the thyroid gland there is a 6 mm nodule. There is no significant mediastinal or hilar adenopathy.  Lungs/Airways: The airways are patent. There are calcified granulomas within the right lower lobe. No suspicious lung nodules are identified. There are no pleural effusions. No pulmonary edema.  Upper abdomen: Large hiatal hernia. Gallbladder  is present. The adrenal glands are normal in appearance.  Chest wall/osseous structures: There is irregularity of the distal and of the right clavicle. There is right acromioclavicular separation. However the sternoclavicular joints bilaterally have a normal appearance. No evidence for lytic or blastic lesions. Mild mid thoracic spondylosis.  IMPRESSION: 1. Normal appearance of the sternoclavicular joints. 2. Irregularity the distal and of the right clavicle, favoring posttraumatic change. 3. Chronic right acromioclavicular separation likely post traumatic. 4. No suspicious clavicular lesion or acute fracture. 5. Coronary artery disease. 6. Small thyroid nodules or cysts, consistent with benign process. Given the patient's age in the appearance of these lesions,  no further evaluation is felt to be necessary based on consensus criteria. 7. Calcified lung granulomas.   Electronically Signed   By: Nolon Nations [name]   On: 11/02/2014 16:41    Assessment & Plan:   Offie was seen today for hyperlipidemia.  Diagnoses and all orders for this visit:  Other specified hypothyroidism Orders: -     T4, free -     TSH -     POCT CBC -     Cancel: Bilirubin Fraction, Neonatal -     CMP14+EGFR  Hyperlipemia Orders: -     Lipid panel -     POCT CBC -     Cancel: Bilirubin Fraction, Neonatal -     CMP14+EGFR  Essential hypertension, benign Orders: -     POCT CBC -     Cancel: Bilirubin Fraction, Neonatal -     CMP14+EGFR  Chronic fatigue Orders: -     POCT CBC -     Cancel: Bilirubin Fraction, Neonatal -     CMP14+EGFR  Pain in joint, shoulder region, right Orders: -     Ambulatory referral to Physical Therapy  Clavicle enlargement Orders: -     Ambulatory referral to Plastic Surgery   I have discontinued Ms. Eccleston's ciprofloxacin, fluconazole, and metroNIDAZOLE. I am also having her maintain her Cholecalciferol (VITAMIN D-3 PO), omega-3 acid ethyl esters, Calcium Carbonate-Vitamin D, aspirin EC, PREMARIN, metoprolol succinate, AMITIZA, levothyroxine, atorvastatin, venlafaxine XR, omeprazole, cycloSPORINE, chlorthalidone, furosemide, clonazePAM, Vitamin D (Ergocalciferol), and cyclobenzaprine.  No orders of the defined types were placed in this encounter.     Follow-up: Return in about 6 months (around 06/24/2015).  Claretta Fraise, [name]

## 2014-12-23 LAB — CMP14+EGFR
A/G RATIO: 1.6 (ref 1.1–2.5)
ALK PHOS: 97 IU/L (ref 39–117)
ALT: 21 IU/L (ref 0–32)
AST: 23 IU/L (ref 0–40)
Albumin: 4.4 g/dL (ref 3.6–4.8)
BUN / CREAT RATIO: 19 (ref 11–26)
BUN: 19 mg/dL (ref 8–27)
Bilirubin Total: 0.6 mg/dL (ref 0.0–1.2)
CHLORIDE: 96 mmol/L — AB (ref 97–108)
CO2: 27 mmol/L (ref 18–29)
Calcium: 10.1 mg/dL (ref 8.7–10.3)
Creatinine, Ser: 1.02 mg/dL — ABNORMAL HIGH (ref 0.57–1.00)
GFR calc Af Amer: 68 mL/min/{1.73_m2} (ref >59)
GFR, EST NON AFRICAN AMERICAN: 59 mL/min/{1.73_m2} — AB (ref >59)
GLOBULIN, TOTAL: 2.7 g/dL (ref 1.5–4.5)
GLUCOSE: 97 mg/dL (ref 65–99)
POTASSIUM: 3.1 mmol/L — AB (ref 3.5–5.2)
Sodium: 140 mmol/L (ref 134–144)
Total Protein: 7.1 g/dL (ref 6.0–8.5)

## 2014-12-23 LAB — LIPID PANEL
Chol/HDL Ratio: 2.5 ratio units (ref 0.0–4.4)
Cholesterol, Total: 177 mg/dL (ref 100–199)
HDL: 71 mg/dL (ref >39)
LDL CALC: 77 mg/dL (ref 0–99)
TRIGLYCERIDES: 144 mg/dL (ref 0–149)
VLDL CHOLESTEROL CAL: 29 mg/dL (ref 5–40)

## 2014-12-23 LAB — TSH: TSH: 1.64 u[IU]/mL (ref 0.450–4.500)

## 2014-12-23 LAB — T4, FREE: Free T4: 1.58 ng/dL (ref 0.82–1.77)

## 2014-12-24 ENCOUNTER — Other Ambulatory Visit: Payer: Self-pay | Admitting: Family Medicine

## 2014-12-24 MED ORDER — POTASSIUM CHLORIDE CRYS ER 20 MEQ PO TBCR: 40.0000 meq | EXTENDED_RELEASE_TABLET | Freq: Two times a day (BID) | ORAL | Status: DC

## 2014-12-29 ENCOUNTER — Telehealth: Payer: Self-pay | Admitting: Family Medicine

## 2015-01-11 ENCOUNTER — Encounter: Payer: Self-pay | Admitting: *Deleted

## 2015-01-11 ENCOUNTER — Other Ambulatory Visit: Payer: Self-pay | Admitting: Family Medicine

## 2015-02-09 ENCOUNTER — Telehealth: Payer: Self-pay | Admitting: Family Medicine

## 2015-02-09 NOTE — Telephone Encounter (Signed)
Pt is having increased edema appt scheduled

## 2015-02-10 ENCOUNTER — Encounter: Payer: Self-pay | Admitting: Family Medicine

## 2015-02-10 ENCOUNTER — Ambulatory Visit (INDEPENDENT_AMBULATORY_CARE_PROVIDER_SITE_OTHER): Payer: Medicaid Other | Admitting: Family Medicine

## 2015-02-10 ENCOUNTER — Ambulatory Visit (INDEPENDENT_AMBULATORY_CARE_PROVIDER_SITE_OTHER): Payer: Medicaid Other

## 2015-02-10 VITALS — Temp 97.3°F | Ht 66.0 in | Wt 224.0 lb

## 2015-02-10 DIAGNOSIS — M25571 Pain in right ankle and joints of right foot: Secondary | ICD-10-CM | POA: Diagnosis not present

## 2015-02-10 DIAGNOSIS — E876 Hypokalemia: Secondary | ICD-10-CM

## 2015-02-10 DIAGNOSIS — I10 Essential (primary) hypertension: Secondary | ICD-10-CM | POA: Diagnosis not present

## 2015-02-10 DIAGNOSIS — R609 Edema, unspecified: Secondary | ICD-10-CM | POA: Diagnosis not present

## 2015-02-10 DIAGNOSIS — E669 Obesity, unspecified: Secondary | ICD-10-CM

## 2015-02-10 DIAGNOSIS — M25579 Pain in unspecified ankle and joints of unspecified foot: Secondary | ICD-10-CM | POA: Insufficient documentation

## 2015-02-10 DIAGNOSIS — E039 Hypothyroidism, unspecified: Secondary | ICD-10-CM

## 2015-02-10 MED ORDER — METOPROLOL SUCCINATE ER 50 MG PO TB24: 50.0000 mg | ORAL_TABLET | Freq: Every day | ORAL | Status: DC

## 2015-02-10 NOTE — Addendum Note (Signed)
Addended by: Bearl Mulberry on: 02/10/2015 03:34 PM   Modules accepted: Orders

## 2015-02-10 NOTE — Progress Notes (Signed)
Subjective:  Patient ID: [name], female    DOB: [birth year]  Age: 64 y.o. MRN: [id number]  CC: Leg Swelling   HPI Jozelynn Danielson presents for fatigue, loss of energy for months. More edema and soreness at right ankle after stepping in a hole last month. Denies dyspnea. Feels rather tired all the time, even when awakening.   Ankle soreness is pesistent. Moderate. Causes her to limp. Worse pain with ambulation. Not improving.   History Samyia has a past medical history of Borderline hypertension; Obesity; Hyperlipidemia; Fe deficiency anemia; Hiatal hernia; PSVT (paroxysmal supraventricular tachycardia) (8/05); Recurrent cystitis; Chronic lower back pain; Lumbar herniated disc; History of mixed drug abuse; Anxiety; Arthritis; Depression; History of cardiac catheterization; Heartburn; Microscopic hematuria; Personal history of venous thrombosis and embolism; Hypothyroidism; and Chronic shoulder pain.   She has past surgical history that includes Appendectomy (2004?or 11/03?); Partial hysterectomy (2004); Right knee arthroscopy (8/05); Right knee replacement (8/05); Carpal tunnel release; Shoulder surgery; Rectocele repair (05/2001); Liver biopsy (2002); Bladder repair; Rectal prolapse repair; and Back surgery.   Her family history includes Breast cancer in her mother and sister; Coronary artery disease in an other family member; Heart attack in an other family member.She reports that she has never smoked. She has never used smokeless tobacco. She reports that she does not drink alcohol or use illicit drugs.  Outpatient Prescriptions Prior to Visit  Medication Sig Dispense Refill  . AMITIZA 24 MCG capsule TAKE ONE CAPSULE BY MOUTH TWICE A DAY 60 capsule 5  . aspirin EC 81 MG tablet Take by mouth. Take 81 mg by mouth daily.    [name] atorvastatin (LIPITOR) 80 MG tablet Take 1 tablet (80 mg total) by mouth daily. 90 tablet 3  . Calcium Carbonate-Vitamin D (CALCIUM 500 + D) 500-125 MG-UNIT TABS  Take by mouth. Take 1 tablet by mouth every morning.    . chlorthalidone (HYGROTON) 25 MG tablet Take 1 tablet (25 mg total) by mouth 2 (two) times daily. 60 tablet 3  . Cholecalciferol (VITAMIN D-3 PO) Take 1 capsule by mouth every morning.     . clonazePAM (KLONOPIN) 2 MG tablet TAKE 1 TABLET TWICE DAILY AS NEEDED FOR ANXIETY 60 tablet 1  . cyclobenzaprine (FLEXERIL) 10 MG tablet TAKE ONE TABLET BY MOUTH THREE TIMES DAILY AS NEEDED FOR MUSCLE SPASM 90 tablet 1  . cycloSPORINE (RESTASIS) 0.05 % ophthalmic emulsion PLACE 1 DROP IN BOTH EYES TWICE DAILY 60 each 2  . furosemide (LASIX) 20 MG tablet TAKE 1 TABLET DAILY AS NEEDED FOR SWELLING 30 tablet 4  . levothyroxine (SYNTHROID, LEVOTHROID) 75 MCG tablet Take 1 tablet (75 mcg total) by mouth daily. 90 tablet 3  . omega-3 acid ethyl esters (LOVAZA) 1 G capsule Take 1 g by mouth 2 (two) times daily.    [name] omeprazole (PRILOSEC) 20 MG capsule TAKE ONE CAPSULE BY MOUTH TWICE A DAY 60 capsule 5  . potassium chloride SA (K-DUR,KLOR-CON) 20 MEQ tablet Take 2 tablets (40 mEq total) by mouth 2 (two) times daily. 120 tablet 5  . PREMARIN vaginal cream APPLY AS DIRECTED 30 g 1  . venlafaxine XR (EFFEXOR-XR) 150 MG 24 hr capsule TAKE ONE CAPSULE EACH MORNING 30 capsule 2  . Vitamin D, Ergocalciferol, (DRISDOL) 50000 UNITS CAPS capsule TAKE 1 CAPSULE TWICE A WEEK 16 capsule 0  . metoprolol succinate (TOPROL XL) 50 MG 24 hr tablet Take 1 tablet (50 mg total) by mouth 2 (two) times daily. Take with or immediately following a meal.  60 tablet 6   No facility-administered medications prior to visit.    ROS Review of Systems  Constitutional: Positive for fatigue. Negative for fever, chills, diaphoresis, appetite change and unexpected weight change.  HENT: Negative for congestion, ear pain, hearing loss, postnasal drip, rhinorrhea, sneezing, sore throat and trouble swallowing.   Eyes: Negative for pain.  Respiratory: Negative for cough, chest tightness and  shortness of breath.   Cardiovascular: Negative for chest pain and palpitations.  Gastrointestinal: Negative for nausea, vomiting, abdominal pain, diarrhea and constipation.  Genitourinary: Negative for dysuria, frequency and menstrual problem.  Musculoskeletal: Positive for joint swelling and arthralgias.       Right ankle   Skin: Negative for rash.  Neurological: Positive for weakness. Negative for dizziness, numbness and headaches.  Psychiatric/Behavioral: Negative for dysphoric mood and agitation.    Objective:  Temp(Src) 97.3 F (36.3 C) (Oral)  Ht _0  (1.676 m)  Wt 224 lb (101.606 kg)  BMI 36.17 kg/m2  BP Readings from Last 3 Encounters:  12/22/14 141/85  10/26/14 148/86  09/23/14 133/81    Wt Readings from Last 3 Encounters:  02/10/15 224 lb (101.606 kg)  12/22/14 226 lb 6.4 oz (102.694 kg)  10/26/14 226 lb (102.513 kg)     Physical Exam  Constitutional: She is oriented to person, place, and time. She appears well-developed and well-nourished. No distress.  HENT:  Head: Normocephalic and atraumatic.  Right Ear: External ear normal.  Left Ear: External ear normal.  Nose: Nose normal.  Mouth/Throat: Oropharynx is clear and moist.  Eyes: Conjunctivae and EOM are normal. Pupils are equal, round, and reactive to light.  Neck: Normal range of motion. Neck supple. No thyromegaly present.  Cardiovascular: Normal rate, regular rhythm and normal heart sounds.   No murmur heard. Pulmonary/Chest: Effort normal and breath sounds normal. No respiratory distress. She has no wheezes. She has no rales.  Abdominal: Soft. Bowel sounds are normal. She exhibits no distension. There is no tenderness.  Lymphadenopathy:    She has no cervical adenopathy.  Neurological: She is alert and oriented to person, place, and time. She has normal reflexes.  Skin: Skin is warm and dry.  Psychiatric: She has a normal mood and affect. Her behavior is normal. Judgment and thought content normal.     Lab Results  Component Value Date   HGBA1C 5.5 12/09/2013    Lab Results  Component Value Date   WBC 9.1 12/22/2014   HGB 14.2 12/22/2014   HCT 44.4 12/22/2014   PLT 163 06/20/2012   GLUCOSE 97 12/22/2014   CHOL 177 12/22/2014   TRIG 144 12/22/2014   HDL 71 12/22/2014   LDLCALC 77 12/22/2014   ALT 21 12/22/2014   AST 23 12/22/2014   NA 140 12/22/2014   K 3.1* 12/22/2014   CL 96* 12/22/2014   CREATININE 1.02* 12/22/2014   BUN 19 12/22/2014   CO2 27 12/22/2014   TSH 1.640 12/22/2014   HGBA1C 5.5 12/09/2013    Ct Chest W Contrast  11/02/2014   CLINICAL DATA:  The patient reports a mass on the right clavicle for 6 months. Area is tender. History of hypertension and COPD.  EXAM: CT CHEST WITH CONTRAST  TECHNIQUE: Multidetector CT imaging of the chest was performed during intravenous contrast administration.  CONTRAST:  36m OMNIPAQUE IOHEXOL 300 MG/ML  SOLN  COMPARISON:  09/23/2014  FINDINGS: Heart: Heart size is normal. There is coronary artery calcification. No pericardial effusion.  Vascular structures: Mild atherosclerotic calcification of the  thoracic aorta. No aneurysm.  Mediastinum/thyroid: Within the left lobe of the thyroid gland there is a 6 mm nodule. Within the right lobe of the thyroid gland there is a 6 mm nodule. There is no significant mediastinal or hilar adenopathy.  Lungs/Airways: The airways are patent. There are calcified granulomas within the right lower lobe. No suspicious lung nodules are identified. There are no pleural effusions. No pulmonary edema.  Upper abdomen: Large hiatal hernia. Gallbladder is present. The adrenal glands are normal in appearance.  Chest wall/osseous structures: There is irregularity of the distal and of the right clavicle. There is right acromioclavicular separation. However the sternoclavicular joints bilaterally have a normal appearance. No evidence for lytic or blastic lesions. Mild mid thoracic spondylosis.  IMPRESSION: 1. Normal  appearance of the sternoclavicular joints. 2. Irregularity the distal and of the right clavicle, favoring posttraumatic change. 3. Chronic right acromioclavicular separation likely post traumatic. 4. No suspicious clavicular lesion or acute fracture. 5. Coronary artery disease. 6. Small thyroid nodules or cysts, consistent with benign process. Given the patient's age in the appearance of these lesions, no further evaluation is felt to be necessary based on consensus criteria. 7. Calcified lung granulomas.   Electronically Signed   By: Nolon Nations [name]   On: 11/02/2014 16:41    Assessment & Plan:   Mattisen was seen today for leg swelling.  Diagnoses and all orders for this visit:  Essential hypertension, benign -     DG Chest 2 View; Future -     BMP8+EGFR  Hypothyroidism, unspecified hypothyroidism type -     DG Chest 2 View; Future -     BMP8+EGFR  Obesity -     DG Chest 2 View; Future -     BMP8+EGFR  Hypokalemia -     DG Chest 2 View; Future -     BMP8+EGFR  Pain in joint, ankle and foot, right -     DG Chest 2 View; Future -     DG Ankle Complete Right; Future -     BMP8+EGFR  Edema -     Brain natriuretic peptide  Other orders -     metoprolol succinate (TOPROL XL) 50 MG 24 hr tablet; Take 1 tablet (50 mg total) by mouth daily. Take with or immediately following a meal.   I have changed Ms. Primmer's metoprolol succinate. I am also having her maintain her Cholecalciferol (VITAMIN D-3 PO), omega-3 acid ethyl esters, Calcium Carbonate-Vitamin D, aspirin EC, PREMARIN, levothyroxine, atorvastatin, venlafaxine XR, omeprazole, cycloSPORINE, chlorthalidone, furosemide, clonazePAM, Vitamin D (Ergocalciferol), cyclobenzaprine, potassium chloride SA, and AMITIZA.  Meds ordered this encounter  Medications  . metoprolol succinate (TOPROL XL) 50 MG 24 hr tablet    Sig: Take 1 tablet (50 mg total) by mouth daily. Take with or immediately following a meal.    Dispense:  30 tablet     Refill:  2   CXR - NAD, hiatal hernia  Follow-up: Return in about 6 weeks (around 03/24/2015), or if symptoms worsen or fail to improve.  Claretta Fraise, [name]

## 2015-02-11 ENCOUNTER — Other Ambulatory Visit: Payer: Self-pay | Admitting: Physician Assistant

## 2015-02-11 ENCOUNTER — Other Ambulatory Visit: Payer: Self-pay | Admitting: Family Medicine

## 2015-02-11 ENCOUNTER — Other Ambulatory Visit: Payer: Self-pay | Admitting: Nurse Practitioner

## 2015-02-11 LAB — BMP8+EGFR
BUN/Creatinine Ratio: 16 (ref 11–26)
BUN: 17 mg/dL (ref 8–27)
CALCIUM: 9.8 mg/dL (ref 8.7–10.3)
CHLORIDE: 97 mmol/L (ref 97–108)
CO2: 24 mmol/L (ref 18–29)
CREATININE: 1.05 mg/dL — AB (ref 0.57–1.00)
GFR calc non Af Amer: 57 mL/min/{1.73_m2} — ABNORMAL LOW (ref >59)
GFR, EST AFRICAN AMERICAN: 65 mL/min/{1.73_m2} (ref >59)
Glucose: 108 mg/dL — ABNORMAL HIGH (ref 65–99)
Potassium: 3.5 mmol/L (ref 3.5–5.2)
Sodium: 140 mmol/L (ref 134–144)

## 2015-02-11 LAB — BRAIN NATRIURETIC PEPTIDE: BNP: 20.7 pg/mL (ref 0.0–100.0)

## 2015-02-11 NOTE — Telephone Encounter (Signed)
Last seen 02/10/15  Dr Darlyn Read   Last Vit D 11/27/14  49.0   If approved route to nurse to call into [facility]

## 2015-02-11 NOTE — Telephone Encounter (Signed)
Wait for Dr. Darlyn Read to address- he just saw her the day prior

## 2015-02-18 ENCOUNTER — Other Ambulatory Visit: Payer: Self-pay | Admitting: Surgery

## 2015-02-18 DIAGNOSIS — E041 Nontoxic single thyroid nodule: Secondary | ICD-10-CM

## 2015-02-24 ENCOUNTER — Ambulatory Visit (HOSPITAL_COMMUNITY)
Admission: RE | Admit: 2015-02-24 | Discharge: 2015-02-24 | Disposition: A | Discharge status: Home / Self Care | Payer: Medicaid Other | Source: Ambulatory Visit | Attending: Surgery | Admitting: Surgery

## 2015-02-24 DIAGNOSIS — E041 Nontoxic single thyroid nodule: Secondary | ICD-10-CM | POA: Diagnosis not present

## 2015-03-13 ENCOUNTER — Other Ambulatory Visit: Payer: Self-pay | Admitting: Family Medicine

## 2015-03-23 ENCOUNTER — Ambulatory Visit: Payer: Medicaid Other | Admitting: Urology

## 2015-03-24 ENCOUNTER — Ambulatory Visit (INDEPENDENT_AMBULATORY_CARE_PROVIDER_SITE_OTHER): Payer: Medicaid Other | Admitting: Family Medicine

## 2015-03-24 ENCOUNTER — Encounter: Payer: Self-pay | Admitting: Family Medicine

## 2015-03-24 VITALS — BP 121/65 | HR 123 | Temp 97.7°F | Ht 66.0 in | Wt 226.2 lb

## 2015-03-24 DIAGNOSIS — M1990 Unspecified osteoarthritis, unspecified site: Secondary | ICD-10-CM

## 2015-03-24 DIAGNOSIS — Z23 Encounter for immunization: Secondary | ICD-10-CM

## 2015-03-24 DIAGNOSIS — R5382 Chronic fatigue, unspecified: Secondary | ICD-10-CM | POA: Diagnosis not present

## 2015-03-24 DIAGNOSIS — E039 Hypothyroidism, unspecified: Secondary | ICD-10-CM

## 2015-03-24 DIAGNOSIS — I10 Essential (primary) hypertension: Secondary | ICD-10-CM | POA: Diagnosis not present

## 2015-03-24 DIAGNOSIS — R609 Edema, unspecified: Secondary | ICD-10-CM | POA: Diagnosis not present

## 2015-03-24 DIAGNOSIS — I517 Cardiomegaly: Secondary | ICD-10-CM

## 2015-03-24 DIAGNOSIS — E785 Hyperlipidemia, unspecified: Secondary | ICD-10-CM

## 2015-03-24 MED ORDER — MELOXICAM 15 MG PO TABS: 15.0000 mg | ORAL_TABLET | Freq: Every day | ORAL | Status: DC

## 2015-03-24 NOTE — Addendum Note (Signed)
Addended by: Orma RenderHODGES, Camile Esters F on: 03/24/2015 03:46 PM   Modules accepted: Orders

## 2015-03-24 NOTE — Progress Notes (Signed)
Subjective:  Patient ID: [name], female    DOB: 11-17-1950  Age: 64 y.o. MRN: [id number]  CC: Hyperlipidemia; Hypertension; Hypothyroidism; and Arthritis   HPI Alanah Sakuma presents for  follow-up of hypertension. Patient has no history of headache chest pain or shortness of breath or recent cough. Patient also denies symptoms of TIA such as numbness weakness lateralizing. Patient checks  blood pressure at home and has not had any elevated readings recently. Patient denies side effects from his medication. States taking it regularly.  Patient also  in for follow-up of elevated cholesterol. Doing well without complaints on current medication. Denies side effects of statin including myalgia and arthralgia and nausea. Also in today for liver function testing. Currently no chest pain, shortness of breath or other cardiovascular related symptoms noted.  Has arthritis pain in back and knees. Also pain in arms. "it just hurts." 6-7/10. Aches all the time.   Easily winded. No energy. Hx of LVH. Nml LVEF on echo 2 yrs ago. (report attached, reviewed)  History Fedra has a past medical history of Borderline hypertension; Obesity; Hyperlipidemia; Fe deficiency anemia; Hiatal hernia; PSVT (paroxysmal supraventricular tachycardia) ([place]) (8/05); Recurrent cystitis; Chronic lower back pain; Lumbar herniated disc; History of mixed drug abuse; Anxiety; Arthritis; Depression; History of cardiac catheterization; Heartburn; Microscopic hematuria; Personal history of venous thrombosis and embolism; Hypothyroidism; and Chronic shoulder pain.   She has past surgical history that includes Appendectomy (2004?or 11/03?); Partial hysterectomy (2004); Right knee arthroscopy (8/05); Right knee replacement (8/05); Carpal tunnel release; Shoulder surgery; Rectocele repair (05/2001); Liver biopsy (2002); Bladder repair; Rectal prolapse repair; and Back surgery.   Her family history includes Breast cancer in her  mother and sister; Coronary artery disease in an other family member; Heart attack in an other family member.She reports that she has never smoked. She has never used smokeless tobacco. She reports that she does not drink alcohol or use illicit drugs.  Current Outpatient Prescriptions on File Prior to Visit  Medication Sig Dispense Refill  . AMITIZA 24 MCG capsule TAKE ONE CAPSULE BY MOUTH TWICE A DAY 60 capsule 5  . aspirin EC 81 MG tablet Take by mouth. Take 81 mg by mouth daily.    [name] atorvastatin (LIPITOR) 80 MG tablet Take 1 tablet (80 mg total) by mouth daily. 90 tablet 3  . Calcium Carbonate-Vitamin D (CALCIUM 500 + D) 500-125 MG-UNIT TABS Take by mouth. Take 1 tablet by mouth every morning.    . chlorthalidone (HYGROTON) 25 MG tablet TAKE ONE TABLET BY MOUTH TWICE DAILY 60 tablet 4  . Cholecalciferol (VITAMIN D-3 PO) Take 1 capsule by mouth every morning.     . clonazePAM (KLONOPIN) 2 MG tablet TAKE 1 TABLET TWICE DAILY AS NEEDED FOR ANXIETY 60 tablet 1  . cyclobenzaprine (FLEXERIL) 10 MG tablet TAKE ONE TABLET BY MOUTH THREE TIMES DAILY AS NEEDED FOR MUSCLE SPASM 90 tablet 1  . furosemide (LASIX) 20 MG tablet TAKE 1 TABLET DAILY AS NEEDED FOR SWELLING 30 tablet 4  . levothyroxine (SYNTHROID, LEVOTHROID) 75 MCG tablet Take 1 tablet (75 mcg total) by mouth daily. 90 tablet 3  . metoprolol succinate (TOPROL XL) 50 MG 24 hr tablet Take 1 tablet (50 mg total) by mouth daily. Take with or immediately following a meal. 30 tablet 2  . omega-3 acid ethyl esters (LOVAZA) 1 G capsule Take 1 g by mouth 2 (two) times daily.    [name] omeprazole (PRILOSEC) 20 MG capsule TAKE ONE CAPSULE BY MOUTH TWICE  A DAY 60 capsule 5  . potassium chloride SA (K-DUR,KLOR-CON) 20 MEQ tablet Take 2 tablets (40 mEq total) by mouth 2 (two) times daily. 120 tablet 5  . PREMARIN vaginal cream APPLY AS DIRECTED 30 g 1  . RESTASIS 0.05 % ophthalmic emulsion PLACE 1 DROP IN BOTH EYES TWICE DAILY 60 each 2  . venlafaxine XR  (EFFEXOR-XR) 150 MG 24 hr capsule TAKE ONE CAPSULE EACH MORNING 30 capsule 2  . Vitamin D, Ergocalciferol, (DRISDOL) 50000 UNITS CAPS capsule TAKE 1 CAPSULE TWICE A WEEK 16 capsule 0   No current facility-administered medications on file prior to visit.    ROS Review of Systems  Objective:  BP 121/65 mmHg  Pulse 123  Temp(Src) 97.7 F (36.5 C) (Oral)  Ht _0  (1.676 m)  Wt 226 lb 3.2 oz (102.604 kg)  BMI 36.53 kg/m2  BP Readings from Last 3 Encounters:  03/24/15 121/65  12/22/14 141/85  10/26/14 148/86    Wt Readings from Last 3 Encounters:  03/24/15 226 lb 3.2 oz (102.604 kg)  02/10/15 224 lb (101.606 kg)  12/22/14 226 lb 6.4 oz (102.694 kg)     Physical Exam  Lab Results  Component Value Date   HGBA1C 5.5 12/09/2013    Lab Results  Component Value Date   WBC 9.1 12/22/2014   HGB 14.2 12/22/2014   HCT 44.4 12/22/2014   PLT 163 06/20/2012   GLUCOSE 108* 02/10/2015   CHOL 177 12/22/2014   TRIG 144 12/22/2014   HDL 71 12/22/2014   LDLCALC 77 12/22/2014   ALT 21 12/22/2014   AST 23 12/22/2014   NA 140 02/10/2015   K 3.5 02/10/2015   CL 97 02/10/2015   CREATININE 1.05* 02/10/2015   BUN 17 02/10/2015   CO2 24 02/10/2015   TSH 1.640 12/22/2014   HGBA1C 5.5 12/09/2013    US Soft Tissue Head/neck  02/24/2015  CLINICAL DATA:  64 year old female with a history of thyroid nodules EXAM: THYROID ULTRASOUND TECHNIQUE: Ultrasound examination of the thyroid gland and adjacent soft tissues was performed. COMPARISON:  08/23/2009, 09/02/2009 with bilateral thyroid nodule biopsy FINDINGS: Right thyroid lobe Measurements: 4.0 cm x 1.6 cm x 1.5 cm. Superior nodule on the right measures 1.1 cm x 1.1 cm x 1.0 cm this is not significantly changed from the comparison. Right inferior nodule measures 1.2 cm x 0.9 cm x 1.0 cm. This measures smaller than comparison study. Left thyroid lobe Measurements: 2.7 cm x 1.1 cm x 1.7 cm. Nodule on the left measures 1.5 cm x 0.9 cm x 1.0 cm.  Single internal calcification. This nodule has been biopsied previously. Isthmus Thickness: 2 mm.  No nodules visualized. Lymphadenopathy None visualized. IMPRESSION: Multinodular thyroid. Right-sided and left-sided nodules have been biopsied previously, 09/02/2009. Recommend correlation with prior biopsy result. Follow-up by clinical exam is recommended. If patient has known risk factors for thyroid carcinoma, consider follow-up ultrasound in 12 months. If patient is clinically hyperthyroid, consider nuclear medicine thyroid uptake and scan.Reference: Management of Thyroid Nodules Detected at [place]: Society of Radiologists in [place]. Radiology 2005; [id number]. Signed, Dulcy Fanny. Earleen Newport, DO [facility] Radiology Electronically Signed   By: Corrie Mckusick D.O.   On: 02/24/2015 15:56    Assessment & Plan:   Sekai was seen today for hyperlipidemia, hypertension, hypothyroidism and arthritis.  Diagnoses and all orders for this visit:  Hypothyroidism, unspecified hypothyroidism type -     CMP14+EGFR -     T4,  free -     CBC with Differential/Platelet  Essential hypertension, benign -     POCT CBC -     Cancel: CMP14+EGFR -     CMP14+EGFR -     CBC with Differential/Platelet  Hyperlipemia -     Cancel: CMP14+EGFR -     Lipid panel -     CMP14+EGFR -     CBC with Differential/Platelet  Encounter for immunization  Chronic fatigue -     CMP14+EGFR -     Sedimentation rate -     Brain natriuretic peptide -     CBC with Differential/Platelet  LVH (left ventricular hypertrophy) -     CMP14+EGFR -     CBC with Differential/Platelet  Edema, unspecified type -     CMP14+EGFR -     D-dimer, quantitative (not at [facility]) -     Brain natriuretic peptide -     CBC with Differential/Platelet  Arthritis -     CMP14+EGFR -     Sedimentation rate -     CBC with Differential/Platelet  Other orders -     Flu  Vaccine QUAD 36+ mos IM   I am having Ms. Yogi maintain her Cholecalciferol (VITAMIN D-3 PO), omega-3 acid ethyl esters, Calcium Carbonate-Vitamin D, aspirin EC, PREMARIN, levothyroxine, atorvastatin, omeprazole, furosemide, potassium chloride SA, AMITIZA, metoprolol succinate, clonazePAM, cyclobenzaprine, Vitamin D (Ergocalciferol), RESTASIS, venlafaxine XR, and chlorthalidone.  No orders of the defined types were placed in this encounter.     Follow-up: No Follow-up on file.  Claretta Fraise, [name]

## 2015-03-25 ENCOUNTER — Telehealth: Payer: Self-pay | Admitting: Family Medicine

## 2015-03-25 DIAGNOSIS — M1990 Unspecified osteoarthritis, unspecified site: Secondary | ICD-10-CM

## 2015-03-25 LAB — CMP14+EGFR
ALT: 24 IU/L (ref 0–32)
AST: 24 IU/L (ref 0–40)
Albumin/Globulin Ratio: 1.7 (ref 1.1–2.5)
Albumin: 4.3 g/dL (ref 3.6–4.8)
Alkaline Phosphatase: 92 IU/L (ref 39–117)
BILIRUBIN TOTAL: 0.6 mg/dL (ref 0.0–1.2)
BUN/Creatinine Ratio: 21 (ref 11–26)
BUN: 23 mg/dL (ref 8–27)
CHLORIDE: 98 mmol/L (ref 97–108)
CO2: 26 mmol/L (ref 18–29)
Calcium: 9.9 mg/dL (ref 8.7–10.3)
Creatinine, Ser: 1.08 mg/dL — ABNORMAL HIGH (ref 0.57–1.00)
GFR calc non Af Amer: 55 mL/min/{1.73_m2} — ABNORMAL LOW (ref >59)
GFR, EST AFRICAN AMERICAN: 63 mL/min/{1.73_m2} (ref >59)
GLUCOSE: 120 mg/dL — AB (ref 65–99)
Globulin, Total: 2.5 g/dL (ref 1.5–4.5)
POTASSIUM: 3.7 mmol/L (ref 3.5–5.2)
Sodium: 141 mmol/L (ref 134–144)
TOTAL PROTEIN: 6.8 g/dL (ref 6.0–8.5)

## 2015-03-25 LAB — LIPID PANEL
CHOL/HDL RATIO: 3.1 ratio (ref 0.0–4.4)
Cholesterol, Total: 203 mg/dL — ABNORMAL HIGH (ref 100–199)
HDL: 65 mg/dL (ref >39)
LDL CALC: 106 mg/dL — AB (ref 0–99)
Triglycerides: 158 mg/dL — ABNORMAL HIGH (ref 0–149)
VLDL Cholesterol Cal: 32 mg/dL (ref 5–40)

## 2015-03-25 LAB — CBC WITH DIFFERENTIAL/PLATELET
BASOS ABS: 0.1 10*3/uL (ref 0.0–0.2)
Basos: 1 %
EOS (ABSOLUTE): 0.1 10*3/uL (ref 0.0–0.4)
EOS: 1 %
HEMATOCRIT: 42.1 % (ref 34.0–46.6)
HEMOGLOBIN: 14.8 g/dL (ref 11.1–15.9)
IMMATURE GRANS (ABS): 0 10*3/uL (ref 0.0–0.1)
Immature Granulocytes: 0 %
LYMPHS: 35 %
Lymphocytes Absolute: 2.6 10*3/uL (ref 0.7–3.1)
MCH: 30.1 pg (ref 26.6–33.0)
MCHC: 35.2 g/dL (ref 31.5–35.7)
MCV: 86 fL (ref 79–97)
MONOCYTES: 7 %
Monocytes Absolute: 0.5 10*3/uL (ref 0.1–0.9)
NEUTROS ABS: 4.2 10*3/uL (ref 1.4–7.0)
Neutrophils: 56 %
Platelets: 238 10*3/uL (ref 150–379)
RBC: 4.91 x10E6/uL (ref 3.77–5.28)
RDW: 13.3 % (ref 12.3–15.4)
WBC: 7.4 10*3/uL (ref 3.4–10.8)

## 2015-03-25 LAB — BRAIN NATRIURETIC PEPTIDE: BNP: 28.4 pg/mL (ref 0.0–100.0)

## 2015-03-25 LAB — SEDIMENTATION RATE: SED RATE: 2 mm/h (ref 0–40)

## 2015-03-25 LAB — D-DIMER, QUANTITATIVE (NOT AT ARMC): D-DIMER: 0.55 mg{FEU}/L — AB (ref 0.00–0.49)

## 2015-03-25 LAB — T4, FREE: FREE T4: 1.45 ng/dL (ref 0.82–1.77)

## 2015-03-25 NOTE — Telephone Encounter (Signed)
Please refer to pain clinic

## 2015-03-25 NOTE — Telephone Encounter (Signed)
Stp and she states the meloxicam you gave her has never worked on her and she would like to know if there is anything else you can give her. Please advise. She is aware you won't be back in the office until tomorrow and is ok with that.

## 2015-03-26 NOTE — Telephone Encounter (Signed)
Order put in for pain management.

## 2015-03-31 ENCOUNTER — Ambulatory Visit: Payer: Medicaid Other | Admitting: Urology

## 2015-04-07 ENCOUNTER — Ambulatory Visit: Payer: Medicaid Other | Admitting: Urology

## 2015-04-13 ENCOUNTER — Other Ambulatory Visit: Payer: Self-pay | Admitting: Family Medicine

## 2015-04-13 NOTE — Telephone Encounter (Signed)
rx called to pharmacy 

## 2015-04-28 ENCOUNTER — Ambulatory Visit (INDEPENDENT_AMBULATORY_CARE_PROVIDER_SITE_OTHER): Payer: Medicaid Other | Admitting: Urology

## 2015-04-28 DIAGNOSIS — N952 Postmenopausal atrophic vaginitis: Secondary | ICD-10-CM | POA: Diagnosis not present

## 2015-04-28 DIAGNOSIS — R102 Pelvic and perineal pain: Secondary | ICD-10-CM

## 2015-04-28 DIAGNOSIS — N3020 Other chronic cystitis without hematuria: Secondary | ICD-10-CM

## 2015-05-11 ENCOUNTER — Other Ambulatory Visit: Payer: Self-pay | Admitting: Family Medicine

## 2015-05-13 ENCOUNTER — Other Ambulatory Visit: Payer: Self-pay | Admitting: Family Medicine

## 2015-05-20 ENCOUNTER — Ambulatory Visit: Payer: Medicaid Other | Admitting: Nurse Practitioner

## 2015-05-25 ENCOUNTER — Encounter: Payer: Self-pay | Admitting: Cardiology

## 2015-05-25 ENCOUNTER — Encounter: Payer: Medicaid Other | Admitting: Cardiology

## 2015-05-25 NOTE — Progress Notes (Signed)
Patient canceled.  This encounter was created in error - please disregard. 

## 2015-06-10 ENCOUNTER — Encounter: Payer: Self-pay | Admitting: *Deleted

## 2015-06-11 ENCOUNTER — Encounter: Payer: Self-pay | Admitting: Cardiology

## 2015-06-11 ENCOUNTER — Ambulatory Visit (INDEPENDENT_AMBULATORY_CARE_PROVIDER_SITE_OTHER): Payer: Medicaid Other | Admitting: Cardiology

## 2015-06-11 VITALS — BP 100/67 | HR 84 | Ht 66.0 in | Wt 223.0 lb

## 2015-06-11 DIAGNOSIS — I10 Essential (primary) hypertension: Secondary | ICD-10-CM | POA: Diagnosis not present

## 2015-06-11 DIAGNOSIS — I471 Supraventricular tachycardia: Secondary | ICD-10-CM

## 2015-06-11 MED ORDER — METOPROLOL SUCCINATE ER 50 MG PO TB24: 50.0000 mg | ORAL_TABLET | Freq: Two times a day (BID) | ORAL | Status: DC

## 2015-06-11 NOTE — Progress Notes (Signed)
Cardiology Office Note  Date: 06/11/2015   ID: [name], DOB February 02, 1951, MRN [id number]  PCP: Mechele Claude, MD  Primary Cardiologist: Nona Dell, MD   Chief Complaint  Patient presents with  . PSVT    History of Present Illness: [name] is a 63 y.o. female last seen in December 2015. She presents for a routine follow-up visit. I reviewed her chart. She tells me that she has felt more palpitations in the last few months. Six months ago her Toprol-XL was cut from 50 mg twice daily to once daily (she had never been on the short-acting formulation). She has not had any syncope. ECG today shows sinus rhythm with old right bundle-branch block.  Blood pressure is low normal. I reviewed her medications. She has been on both chlorthalidone and Maxide.  Follow-up lab work from October is outlined below as well.  Past Medical History  Diagnosis Date  . Borderline hypertension   . Obesity   . Hyperlipidemia   . Fe deficiency anemia   . Hiatal hernia   . PSVT (paroxysmal supraventricular tachycardia) (HCC) 8/05  . Recurrent cystitis     Dr. Aldean Ast   . Chronic lower back pain   . Lumbar herniated disc     L4-L5   . History of mixed drug abuse   . Anxiety   . Arthritis   . Depression   . History of cardiac catheterization     Normal coronaries  . Heartburn   . Microscopic hematuria   . Personal history of venous thrombosis and embolism   . Hypothyroidism     [name]   . Chronic shoulder pain     Current Outpatient Prescriptions  Medication Sig Dispense Refill  . AMITIZA 24 MCG capsule TAKE ONE CAPSULE BY MOUTH TWICE A DAY 60 capsule 5  . aspirin EC 81 MG tablet Take by mouth. Take 81 mg by mouth daily.    . Calcium Carbonate-Vitamin D (CALCIUM 500 + D) 500-125 MG-UNIT TABS Take by mouth. Take 1 tablet by mouth every morning.    . Cholecalciferol (VITAMIN D-3 PO) Take 1 capsule by mouth every morning.     . clonazePAM (KLONOPIN) 2 MG tablet TAKE 1  TABLET TWICE DAILY AS NEEDED FOR ANXIETY 60 tablet 5  . cyclobenzaprine (FLEXERIL) 10 MG tablet TAKE ONE TABLET BY MOUTH THREE TIMES DAILY AS NEEDED FOR MUSCLE SPASM 90 tablet 2  . ezetimibe-simvastatin (VYTORIN) 10-80 MG tablet Take 1 tablet by mouth daily.    [name] levothyroxine (SYNTHROID, LEVOTHROID) 88 MCG tablet Take 88 mcg by mouth daily.    . nitrofurantoin (MACRODANTIN) 100 MG capsule Take 100 mg by mouth at bedtime.    [name] omega-3 acid ethyl esters (LOVAZA) 1 G capsule Take 1 g by mouth 2 (two) times daily.    [name] omeprazole (PRILOSEC) 20 MG capsule TAKE ONE CAPSULE BY MOUTH TWICE A DAY 60 capsule 4  . Ospemifene (OSPHENA) 60 MG TABS Take 1 tablet by mouth daily.    . potassium chloride SA (K-DUR,KLOR-CON) 20 MEQ tablet Take 2 tablets (40 mEq total) by mouth 2 (two) times daily. 120 tablet 5  . PREMARIN vaginal cream APPLY AS DIRECTED 30 g 1  . RESTASIS 0.05 % ophthalmic emulsion INSTILL ONE DROP IN BOTH EYES TWICE DAILY 60 each 2  . triamterene-hydrochlorothiazide (MAXZIDE-25) 37.5-25 MG tablet Take 1 tablet by mouth daily.    [name] venlafaxine XR (EFFEXOR-XR) 150 MG 24 hr capsule TAKE ONE CAPSULE EACH MORNING 30 capsule  1  . metoprolol succinate (TOPROL XL) 50 MG 24 hr tablet Take 1 tablet (50 mg total) by mouth 2 (two) times daily. Take with or immediately following a meal. 180 tablet 3   No current facility-administered medications for this visit.   Allergies:  Clindamycin; Diphenhydramine hcl; and Naproxen   Social History: The patient  reports that she has never smoked. She has never used smokeless tobacco. She reports that she does not drink alcohol or use illicit drugs.   ROS:  Please see the history of present illness. Otherwise, complete review of systems is positive for anxiety, chronic back pain and shoulder pain.  All other systems are reviewed and negative.   Physical Exam: VS:  BP 100/67 mmHg  Pulse 84  Ht 5\' 6"  (1.676 m)  Wt 223 lb (101.152 kg)  BMI 36.01 kg/m2  SpO2 97%,  BMI Body mass index is 36.01 kg/(m^2).  Wt Readings from Last 3 Encounters:  06/11/15 223 lb (101.152 kg)  03/24/15 226 lb 3.2 oz (102.604 kg)  02/10/15 224 lb (101.606 kg)    Obese woman, appears comfortable at rest. HEENT: Conjunctiva and lids normal, oropharynx clear. Neck: Supple, no elevated JVP or carotid bruits, no thyromegaly. Lungs: Clear to auscultation, nonlabored breathing at rest. Cardiac: Regular rate and rhythm, no S3 or significant systolic murmur, no pericardial rub. Extremities: No pitting edema, distal pulses 2+.  ECG: ECG is ordered today.  Recent Labwork: 12/22/2014: Hemoglobin 14.2; TSH 1.640 03/24/2015: ALT 24; AST 24; BNP 28.4; BUN 23; Creatinine, Ser 1.08*; Potassium 3.7; Sodium 141     Component Value Date/Time   CHOL 203* 03/24/2015 1543   CHOL 179 10/26/2014 1655   CHOL 166 01/02/2013 1146   TRIG 158* 03/24/2015 1543   TRIG 122 10/26/2014 1655   TRIG 103 01/02/2013 1146   HDL 65 03/24/2015 1543   HDL 72 10/26/2014 1655   HDL 75 01/02/2013 1146   CHOLHDL 3.1 03/24/2015 1543   LDLCALC 106* 03/24/2015 1543   LDLCALC 57 12/09/2013 1048   LDLCALC 70 01/02/2013 1146    Other Studies Reviewed Today:  Echocardiogram from July 2014 showed mild LVH with LVEF 55-60%, normal diastolic parameters, mild left atrial, mild tricuspid regurgitation, RV-RA gradient 22 mm mercury.  Assessment and Plan:  1. History of PSVT. She is reporting increased palpitations over the last few months. Plan is to increase Toprol-XL to 50 mg twice daily which she had tolerated in the past with better symptom control. Toprol-XL 100 mg daily could alternatively be considered, but she did well before on the divided dose and we will keep it that way for now.  2. History of borderline hypertension. Blood pressure is low normal today. I have asked her to stop chlorthalidone for now. Keep follow-up with primary care provider.  3. Prior history of cardiac catheterization with normal  coronary arteries.  Current medicines were reviewed with the patient today.   Orders Placed This Encounter  Procedures  . EKG 12-Lead    Disposition: FU with me in 1 year.   Signed, Jonelle SidleSamuel G. Lanisa Ishler, MD, [facility] 06/11/2015 3:33 PM    [facility] [address], [place], [place] [address] Phone: [phone]; Fax: [phone]

## 2015-06-11 NOTE — Patient Instructions (Signed)
Your physician wants you to follow-up in: 1 YEAR WITH DR. Diona BrownerMCDOWELL You will receive a reminder letter in the mail two months in advance. If you don't receive a letter, please call our office to schedule the follow-up appointment.  Your physician has recommended you make the following change in your medication:   INCREASE TOPROL XL 50 MG TWICE DAILY  STOP TAKING CHLORTHALIDONE   Thank you for choosing [facility]!!

## 2015-06-15 ENCOUNTER — Other Ambulatory Visit: Payer: Self-pay

## 2015-06-15 DIAGNOSIS — Z1231 Encounter for screening mammogram for malignant neoplasm of breast: Secondary | ICD-10-CM

## 2015-06-23 ENCOUNTER — Ambulatory Visit: Payer: Medicaid Other | Admitting: Family Medicine

## 2015-06-24 ENCOUNTER — Ambulatory Visit: Payer: Medicaid Other | Admitting: Family Medicine

## 2015-06-29 ENCOUNTER — Encounter: Payer: Self-pay | Admitting: Family Medicine

## 2015-06-29 ENCOUNTER — Ambulatory Visit (INDEPENDENT_AMBULATORY_CARE_PROVIDER_SITE_OTHER): Payer: Medicaid Other | Admitting: Family Medicine

## 2015-06-29 DIAGNOSIS — I10 Essential (primary) hypertension: Secondary | ICD-10-CM | POA: Diagnosis not present

## 2015-06-29 DIAGNOSIS — E039 Hypothyroidism, unspecified: Secondary | ICD-10-CM

## 2015-06-29 DIAGNOSIS — E785 Hyperlipidemia, unspecified: Secondary | ICD-10-CM

## 2015-06-29 DIAGNOSIS — F329 Major depressive disorder, single episode, unspecified: Secondary | ICD-10-CM | POA: Diagnosis not present

## 2015-06-29 DIAGNOSIS — E669 Obesity, unspecified: Secondary | ICD-10-CM

## 2015-06-29 DIAGNOSIS — E559 Vitamin D deficiency, unspecified: Secondary | ICD-10-CM | POA: Diagnosis not present

## 2015-06-29 DIAGNOSIS — F32A Depression, unspecified: Secondary | ICD-10-CM | POA: Insufficient documentation

## 2015-06-29 DIAGNOSIS — F411 Generalized anxiety disorder: Secondary | ICD-10-CM | POA: Insufficient documentation

## 2015-06-29 LAB — POCT URINALYSIS DIPSTICK
BILIRUBIN UA: NEGATIVE
GLUCOSE UA: NEGATIVE
Ketones, UA: NEGATIVE
NITRITE UA: NEGATIVE
Protein, UA: NEGATIVE
RBC UA: NEGATIVE
Spec Grav, UA: 1.025
Urobilinogen, UA: NEGATIVE
pH, UA: 5

## 2015-06-29 MED ORDER — VENLAFAXINE HCL ER 225 MG PO TB24: 225.0000 mg | ORAL_TABLET | Freq: Every day | ORAL | Status: DC

## 2015-06-29 MED ORDER — ESTROGENS, CONJUGATED 0.625 MG/GM VA CREA: TOPICAL_CREAM | VAGINAL | Status: DC

## 2015-06-29 NOTE — Progress Notes (Signed)
Subjective:  Patient ID: [name], female    DOB: 1950/11/26  Age: 65 y.o. MRN: [id number]  CC: Hypertension; Hyperlipidemia; Hypothyroidism; Depression; and GAD   HPI [name] presents for  follow-up of hypertension. Patient has no history of headache chest pain or shortness of breath or recent cough. Patient also denies symptoms of TIA such as numbness weakness lateralizing. Patient checks  blood pressure at home and has not had any elevated readings recently. Patient denies side effects from his medication. States taking it regularly.  Patient also  in for follow-up of elevated cholesterol. Doing well without complaints on current medication. Denies side effects of statin including myalgia and arthralgia and nausea. Also in today for liver function testing. Currently no chest pain, shortness of breath or other cardiovascular related symptoms noted.  Patient presents for follow-up on  thyroid. She has a history of hypothyroidism for many years. It has been stable recently. Pt. denies any change in  voice, loss of hair, heat or cold intolerance. Energy level has been adequate to good. She denies constipation and diarrhea. No myxedema. Medication is as noted below. Verified that pt is taking it daily on an empty stomach. Well tolerated.  Depression screen [facility] 2/9 06/29/2015 03/24/2015 12/22/2014 09/23/2014 05/22/2014  Decreased Interest _0 0  Down, Depressed, Hopeless _1 PHQ - 2 Score _2 Altered sleeping 1 0 3 3 -  Tired, decreased energy _3 -  Change in appetite 2 0 0 1 -  Feeling bad or failure about yourself  2 3 0 2 -  Trouble concentrating 1 1 0 1 -  Moving slowly or fidgety/restless 0 0 0 0 -  Suicidal thoughts 0 0 0 0 -  PHQ-9 Score _4 -  Difficult doing work/chores Somewhat difficult Somewhat difficult - - -    GAD 7 : Generalized Anxiety Score 06/29/2015  Nervous, Anxious, on Edge 2  Control/stop worrying 3  Worry too much -  different things 2  Trouble relaxing 1  Restless 0  Easily annoyed or irritable 1  Afraid - awful might happen 0  Total GAD 7 Score 9  Anxiety Difficulty Somewhat difficult        History [name] has a past medical history of Borderline hypertension; Obesity; Hyperlipidemia; Fe deficiency anemia; Hiatal hernia; PSVT (paroxysmal supraventricular tachycardia) ([place]) (8/05); Recurrent cystitis; Chronic lower back pain; Lumbar herniated disc; History of mixed drug abuse; Anxiety; Arthritis; Depression; History of cardiac catheterization; Heartburn; Microscopic hematuria; Personal history of venous thrombosis and embolism; Hypothyroidism; and Chronic shoulder pain.   She has past surgical history that includes Appendectomy (2004?or 11/03?); Partial hysterectomy (2004); Right knee arthroscopy (8/05); Right knee replacement (8/05); Carpal tunnel release; Shoulder surgery; Rectocele repair (05/2001); Liver biopsy (2002); Bladder repair; Rectal prolapse repair; and Back surgery.   Her family history includes Breast cancer in her mother and sister.She reports that she has never smoked. She has never used smokeless tobacco. She reports that she does not drink alcohol or use illicit drugs.  Current Outpatient Prescriptions on File Prior to Visit  Medication Sig Dispense Refill  . AMITIZA 24 MCG capsule TAKE ONE CAPSULE BY MOUTH TWICE A DAY 60 capsule 5  . aspirin EC 81 MG tablet Take by mouth. Take 81 mg by mouth daily.    . Calcium Carbonate-Vitamin D (CALCIUM 500 + D) 500-125 MG-UNIT TABS Take by mouth. Take 1 tablet by  mouth every morning.    . Cholecalciferol (VITAMIN D-3 PO) Take 1 capsule by mouth every morning.     . clonazePAM (KLONOPIN) 2 MG tablet TAKE 1 TABLET TWICE DAILY AS NEEDED FOR ANXIETY 60 tablet 5  . cyclobenzaprine (FLEXERIL) 10 MG tablet TAKE ONE TABLET BY MOUTH THREE TIMES DAILY AS NEEDED FOR MUSCLE SPASM 90 tablet 2  . ezetimibe-simvastatin (VYTORIN) 10-80 MG tablet Take 1 tablet  by mouth daily.    [name] levothyroxine (SYNTHROID, LEVOTHROID) 88 MCG tablet Take 88 mcg by mouth daily.    . metoprolol succinate (TOPROL XL) 50 MG 24 hr tablet Take 1 tablet (50 mg total) by mouth 2 (two) times daily. Take with or immediately following a meal. 180 tablet 3  . nitrofurantoin (MACRODANTIN) 100 MG capsule Take 100 mg by mouth at bedtime.    [name] omega-3 acid ethyl esters (LOVAZA) 1 G capsule Take 1 g by mouth 2 (two) times daily.    [name] omeprazole (PRILOSEC) 20 MG capsule TAKE ONE CAPSULE BY MOUTH TWICE A DAY 60 capsule 4  . potassium chloride SA (K-DUR,KLOR-CON) 20 MEQ tablet Take 2 tablets (40 mEq total) by mouth 2 (two) times daily. 120 tablet 5  . RESTASIS 0.05 % ophthalmic emulsion INSTILL ONE DROP IN BOTH EYES TWICE DAILY 60 each 2  . triamterene-hydrochlorothiazide (MAXZIDE-25) 37.5-25 MG tablet Take 1 tablet by mouth daily.    . Ospemifene (OSPHENA) 60 MG TABS Take 1 tablet by mouth daily. Reported on 06/29/2015     No current facility-administered medications on file prior to visit.    ROS Review of Systems  Constitutional: Negative for fever, activity change and appetite change.  HENT: Negative for congestion, rhinorrhea and sore throat.   Eyes: Negative for visual disturbance.  Respiratory: Negative for cough and shortness of breath.   Cardiovascular: Negative for chest pain and palpitations.  Gastrointestinal: Negative for nausea, abdominal pain and diarrhea.  Genitourinary: Negative for dysuria.  Musculoskeletal: Negative for myalgias and arthralgias.  Psychiatric/Behavioral: Positive for decreased concentration. The patient is nervous/anxious.     Objective:  BP 128/80 mmHg  Pulse 84  Temp(Src) 97.6 F (36.4 C) (Oral)  Ht _0  (1.676 m)  Wt 225 lb 9.6 oz (102.331 kg)  BMI 36.43 kg/m2  SpO2 99%  BP Readings from Last 3 Encounters:  06/29/15 128/80  06/11/15 100/67  03/24/15 121/65    Wt Readings from Last 3 Encounters:  06/29/15 225 lb 9.6 oz (102.331  kg)  06/11/15 223 lb (101.152 kg)  03/24/15 226 lb 3.2 oz (102.604 kg)     Physical Exam  Constitutional: She is oriented to person, place, and time. She appears well-developed and well-nourished. No distress.  HENT:  Head: Normocephalic and atraumatic.  Right Ear: External ear normal.  Left Ear: External ear normal.  Nose: Nose normal.  Mouth/Throat: Oropharynx is clear and moist.  Eyes: Conjunctivae and EOM are normal. Pupils are equal, round, and reactive to light.  Neck: Normal range of motion. Neck supple. No thyromegaly present.  Cardiovascular: Normal rate, regular rhythm and normal heart sounds.   No murmur heard. Pulmonary/Chest: Effort normal and breath sounds normal. No respiratory distress. She has no wheezes. She has no rales.  Abdominal: Soft. Bowel sounds are normal. She exhibits no distension. There is no tenderness.  Lymphadenopathy:    She has no cervical adenopathy.  Neurological: She is alert and oriented to person, place, and time. She has normal reflexes.  Skin: Skin is warm and dry.  Psychiatric: She has a normal mood and affect. Her behavior is normal. Judgment and thought content normal.    Lab Results  Component Value Date   HGBA1C 5.5 12/09/2013    Lab Results  Component Value Date   WBC 7.8 06/29/2015   HGB 14.2 12/22/2014   HCT 42.1 06/29/2015   PLT 237 06/29/2015   GLUCOSE 88 06/29/2015   CHOL 203* 06/29/2015   TRIG 150* 06/29/2015   HDL 66 06/29/2015   LDLCALC 107* 06/29/2015   ALT 20 06/29/2015   AST 21 06/29/2015   NA 142 06/29/2015   K 3.4* 06/29/2015   CL 98 06/29/2015   CREATININE 0.95 06/29/2015   BUN 22 06/29/2015   CO2 24 06/29/2015   TSH 2.080 06/29/2015   HGBA1C 5.5 12/09/2013    US Soft Tissue Head/neck  02/24/2015  CLINICAL DATA:  65 year old female with a history of thyroid nodules EXAM: THYROID ULTRASOUND TECHNIQUE: Ultrasound examination of the thyroid gland and adjacent soft tissues was performed. COMPARISON:   08/23/2009, 09/02/2009 with bilateral thyroid nodule biopsy FINDINGS: Right thyroid lobe Measurements: 4.0 cm x 1.6 cm x 1.5 cm. Superior nodule on the right measures 1.1 cm x 1.1 cm x 1.0 cm this is not significantly changed from the comparison. Right inferior nodule measures 1.2 cm x 0.9 cm x 1.0 cm. This measures smaller than comparison study. Left thyroid lobe Measurements: 2.7 cm x 1.1 cm x 1.7 cm. Nodule on the left measures 1.5 cm x 0.9 cm x 1.0 cm. Single internal calcification. This nodule has been biopsied previously. Isthmus Thickness: 2 mm.  No nodules visualized. Lymphadenopathy None visualized. IMPRESSION: Multinodular thyroid. Right-sided and left-sided nodules have been biopsied previously, 09/02/2009. Recommend correlation with prior biopsy result. Follow-up by clinical exam is recommended. If patient has known risk factors for thyroid carcinoma, consider follow-up ultrasound in 12 months. If patient is clinically hyperthyroid, consider nuclear medicine thyroid uptake and scan.Reference: Management of Thyroid Nodules Detected at [place]: Society of Radiologists in [place]. Radiology 2005; [id number]. Signed, Dulcy Fanny. Earleen Newport, DO [facility] Radiology Electronically Signed   By: Corrie Mckusick D.O.   On: 02/24/2015 15:56    Assessment & Plan:   Gudelia was seen today for hypertension, hyperlipidemia, hypothyroidism, depression and gad.  Diagnoses and all orders for this visit:  Essential hypertension, benign -     CBC with Differential/Platelet -     CMP14+EGFR -     POCT urinalysis dipstick  Hyperlipemia -     CBC with Differential/Platelet -     CMP14+EGFR -     Lipid panel  Hypothyroidism, unspecified hypothyroidism type -     CBC with Differential/Platelet -     CMP14+EGFR -     TSH + free T4 -     POCT urinalysis dipstick  Obesity -     CBC with Differential/Platelet -     CMP14+EGFR  Vitamin  D deficiency -     CBC with Differential/Platelet -     CMP14+EGFR -     Vitamin D 1,25 dihydroxy  Depression -     CBC with Differential/Platelet -     CMP14+EGFR  Other orders -     Venlafaxine HCl 225 MG TB24; Take 1 tablet (225 mg total) by mouth daily. -     conjugated estrogens (PREMARIN) vaginal cream; apply once weekly   I have discontinued Ms. Tsuda's venlafaxine XR. I have also changed her PREMARIN to conjugated estrogens.  Additionally, I am having her start on Venlafaxine HCl. Lastly, I am having her maintain her Cholecalciferol (VITAMIN D-3 PO), omega-3 acid ethyl esters, Calcium Carbonate-Vitamin D, aspirin EC, potassium chloride SA, AMITIZA, cyclobenzaprine, clonazePAM, RESTASIS, omeprazole, nitrofurantoin, Ospemifene, levothyroxine, triamterene-hydrochlorothiazide, ezetimibe-simvastatin, and metoprolol succinate.  Meds ordered this encounter  Medications  . Venlafaxine HCl 225 MG TB24    Sig: Take 1 tablet (225 mg total) by mouth daily.    Dispense:  30 each    Refill:  5  . conjugated estrogens (PREMARIN) vaginal cream    Sig: apply once weekly    Dispense:  30 g    Refill:  5     Follow-up: Return in about 6 months (around 12/27/2015) for CPE.  Claretta Fraise, [name]

## 2015-07-03 LAB — VITAMIN D 1,25 DIHYDROXY
VITAMIN D 1, 25 (OH) TOTAL: 67 pg/mL
Vitamin D2 1, 25 (OH)2: 35 pg/mL
Vitamin D3 1, 25 (OH)2: 32 pg/mL

## 2015-07-03 LAB — LIPID PANEL
CHOLESTEROL TOTAL: 203 mg/dL — AB (ref 100–199)
Chol/HDL Ratio: 3.1 ratio units (ref 0.0–4.4)
HDL: 66 mg/dL (ref >39)
LDL CALC: 107 mg/dL — AB (ref 0–99)
Triglycerides: 150 mg/dL — ABNORMAL HIGH (ref 0–149)
VLDL CHOLESTEROL CAL: 30 mg/dL (ref 5–40)

## 2015-07-03 LAB — TSH+FREE T4
FREE T4: 1.41 ng/dL (ref 0.82–1.77)
TSH: 2.08 u[IU]/mL (ref 0.450–4.500)

## 2015-07-03 LAB — CBC WITH DIFFERENTIAL/PLATELET
BASOS ABS: 0 10*3/uL (ref 0.0–0.2)
Basos: 1 %
EOS (ABSOLUTE): 0.1 10*3/uL (ref 0.0–0.4)
Eos: 2 %
Hematocrit: 42.1 % (ref 34.0–46.6)
Hemoglobin: 14.3 g/dL (ref 11.1–15.9)
Immature Grans (Abs): 0 10*3/uL (ref 0.0–0.1)
Immature Granulocytes: 0 %
LYMPHS ABS: 2.8 10*3/uL (ref 0.7–3.1)
Lymphs: 36 %
MCH: 29.4 pg (ref 26.6–33.0)
MCHC: 34 g/dL (ref 31.5–35.7)
MCV: 87 fL (ref 79–97)
MONOS ABS: 0.6 10*3/uL (ref 0.1–0.9)
Monocytes: 8 %
Neutrophils Absolute: 4.2 10*3/uL (ref 1.4–7.0)
Neutrophils: 53 %
Platelets: 237 10*3/uL (ref 150–379)
RBC: 4.86 x10E6/uL (ref 3.77–5.28)
RDW: 13.5 % (ref 12.3–15.4)
WBC: 7.8 10*3/uL (ref 3.4–10.8)

## 2015-07-03 LAB — CMP14+EGFR
ALK PHOS: 104 IU/L (ref 39–117)
ALT: 20 IU/L (ref 0–32)
AST: 21 IU/L (ref 0–40)
Albumin/Globulin Ratio: 1.7 (ref 1.1–2.5)
Albumin: 4.2 g/dL (ref 3.6–4.8)
BUN / CREAT RATIO: 23 (ref 11–26)
BUN: 22 mg/dL (ref 8–27)
Bilirubin Total: 0.4 mg/dL (ref 0.0–1.2)
CO2: 24 mmol/L (ref 18–29)
CREATININE: 0.95 mg/dL (ref 0.57–1.00)
Calcium: 9.5 mg/dL (ref 8.7–10.3)
Chloride: 98 mmol/L (ref 96–106)
GFR calc non Af Amer: 63 mL/min/{1.73_m2} (ref >59)
GFR, EST AFRICAN AMERICAN: 73 mL/min/{1.73_m2} (ref >59)
GLOBULIN, TOTAL: 2.5 g/dL (ref 1.5–4.5)
GLUCOSE: 88 mg/dL (ref 65–99)
Potassium: 3.4 mmol/L — ABNORMAL LOW (ref 3.5–5.2)
SODIUM: 142 mmol/L (ref 134–144)
Total Protein: 6.7 g/dL (ref 6.0–8.5)

## 2015-07-13 ENCOUNTER — Other Ambulatory Visit: Payer: Self-pay | Admitting: Family Medicine

## 2015-07-14 ENCOUNTER — Other Ambulatory Visit: Payer: Self-pay | Admitting: Family Medicine

## 2015-07-20 ENCOUNTER — Ambulatory Visit: Payer: Medicaid Other

## 2015-07-27 ENCOUNTER — Ambulatory Visit: Payer: Medicaid Other

## 2015-07-28 ENCOUNTER — Ambulatory Visit: Payer: Medicaid Other | Admitting: Urology

## 2015-08-05 ENCOUNTER — Ambulatory Visit
Admission: RE | Admit: 2015-08-05 | Discharge: 2015-08-05 | Disposition: A | Discharge status: Home / Self Care | Payer: Medicaid Other | Source: Ambulatory Visit

## 2015-08-05 DIAGNOSIS — Z1231 Encounter for screening mammogram for malignant neoplasm of breast: Secondary | ICD-10-CM

## 2015-08-11 ENCOUNTER — Other Ambulatory Visit: Payer: Self-pay | Admitting: Family Medicine

## 2015-11-10 ENCOUNTER — Other Ambulatory Visit: Payer: Self-pay | Admitting: Family Medicine

## 2015-11-10 NOTE — Telephone Encounter (Signed)
Please review and advise.

## 2015-11-10 NOTE — Telephone Encounter (Signed)
Last seen 06/29/15  Dr Darlyn ReadStacks   If approved route to nurse to call into [facility]

## 2015-11-11 ENCOUNTER — Other Ambulatory Visit: Payer: Self-pay | Admitting: Family Medicine

## 2015-11-12 ENCOUNTER — Encounter: Payer: Self-pay | Admitting: *Deleted

## 2015-11-12 NOTE — Telephone Encounter (Signed)
RX for Clonazepam [facility] Okayed per Dr Darlyn ReadStacks

## 2015-11-15 ENCOUNTER — Other Ambulatory Visit: Payer: Self-pay | Admitting: Family Medicine

## 2015-11-16 ENCOUNTER — Telehealth: Payer: Self-pay | Admitting: Family Medicine

## 2015-11-16 NOTE — Telephone Encounter (Signed)
Pt takes medication on a daily basis for recurrent cystitis. Appt made for this coming Monday 6/12 w/ pts PCP

## 2015-11-16 NOTE — Telephone Encounter (Signed)
In order to consider this patient request, the patient will need to see a provider, preferably their PCP. 

## 2015-11-16 NOTE — Telephone Encounter (Signed)
Pt was seen 06/29/15, is to return in 6 mos, appt was & is set for 12/28/15.

## 2015-11-16 NOTE — Telephone Encounter (Signed)
Last seen 06/29/15 Dr Darlyn ReadStacks

## 2015-11-22 ENCOUNTER — Encounter: Payer: Medicaid Other | Admitting: Family Medicine

## 2015-12-03 ENCOUNTER — Other Ambulatory Visit: Payer: Self-pay | Admitting: Family Medicine

## 2015-12-11 ENCOUNTER — Other Ambulatory Visit: Payer: Self-pay | Admitting: Family Medicine

## 2015-12-13 ENCOUNTER — Other Ambulatory Visit: Payer: Self-pay | Admitting: *Deleted

## 2015-12-13 MED ORDER — CLONAZEPAM 2 MG PO TABS: ORAL_TABLET | ORAL | Status: DC

## 2015-12-13 NOTE — Telephone Encounter (Signed)
If approved please route back to Pool A for nurse to call in. 

## 2015-12-13 NOTE — Telephone Encounter (Signed)
rx called into pharmacy

## 2015-12-28 ENCOUNTER — Encounter: Payer: Medicaid Other | Admitting: Family Medicine

## 2015-12-29 ENCOUNTER — Encounter: Payer: Medicaid Other | Admitting: Family Medicine

## 2015-12-29 ENCOUNTER — Other Ambulatory Visit: Payer: Self-pay | Admitting: Family Medicine

## 2015-12-30 ENCOUNTER — Emergency Department (HOSPITAL_COMMUNITY)
Admission: EM | Admit: 2015-12-30 | Discharge: 2015-12-31 | Disposition: A | Discharge status: Home / Self Care | Payer: Medicaid Other | Attending: Emergency Medicine | Admitting: Emergency Medicine

## 2015-12-30 ENCOUNTER — Encounter (HOSPITAL_COMMUNITY): Payer: Self-pay | Admitting: Emergency Medicine

## 2015-12-30 ENCOUNTER — Encounter: Payer: Self-pay | Admitting: Family Medicine

## 2015-12-30 ENCOUNTER — Emergency Department (HOSPITAL_COMMUNITY): Payer: Medicaid Other

## 2015-12-30 DIAGNOSIS — W010XXA Fall on same level from slipping, tripping and stumbling without subsequent striking against object, initial encounter: Secondary | ICD-10-CM | POA: Diagnosis not present

## 2015-12-30 DIAGNOSIS — Y999 Unspecified external cause status: Secondary | ICD-10-CM | POA: Diagnosis not present

## 2015-12-30 DIAGNOSIS — Y939 Activity, unspecified: Secondary | ICD-10-CM | POA: Diagnosis not present

## 2015-12-30 DIAGNOSIS — E785 Hyperlipidemia, unspecified: Secondary | ICD-10-CM | POA: Insufficient documentation

## 2015-12-30 DIAGNOSIS — E039 Hypothyroidism, unspecified: Secondary | ICD-10-CM | POA: Insufficient documentation

## 2015-12-30 DIAGNOSIS — S82851A Displaced trimalleolar fracture of right lower leg, initial encounter for closed fracture: Secondary | ICD-10-CM | POA: Diagnosis not present

## 2015-12-30 DIAGNOSIS — Y929 Unspecified place or not applicable: Secondary | ICD-10-CM | POA: Insufficient documentation

## 2015-12-30 DIAGNOSIS — T148XXA Other injury of unspecified body region, initial encounter: Secondary | ICD-10-CM

## 2015-12-30 DIAGNOSIS — Z7982 Long term (current) use of aspirin: Secondary | ICD-10-CM | POA: Diagnosis not present

## 2015-12-30 DIAGNOSIS — M25561 Pain in right knee: Secondary | ICD-10-CM

## 2015-12-30 DIAGNOSIS — M79671 Pain in right foot: Secondary | ICD-10-CM | POA: Diagnosis present

## 2015-12-30 DIAGNOSIS — S9304XA Dislocation of right ankle joint, initial encounter: Secondary | ICD-10-CM | POA: Diagnosis not present

## 2015-12-30 DIAGNOSIS — Z79899 Other long term (current) drug therapy: Secondary | ICD-10-CM | POA: Diagnosis not present

## 2015-12-30 DIAGNOSIS — F329 Major depressive disorder, single episode, unspecified: Secondary | ICD-10-CM | POA: Diagnosis not present

## 2015-12-30 MED ORDER — FENTANYL CITRATE (PF) 100 MCG/2ML IJ SOLN
50.0000 ug | Freq: Once | INTRAMUSCULAR | Status: DC
  Filled 2015-12-30: qty 2

## 2015-12-30 MED ORDER — PROPOFOL 10 MG/ML IV BOLUS
200.0000 mg | Freq: Once | INTRAVENOUS | Status: DC
  Filled 2015-12-30: qty 20

## 2015-12-30 NOTE — ED Notes (Signed)
Pt transported to xray 

## 2015-12-30 NOTE — ED Notes (Signed)
Pt returned from xray

## 2015-12-30 NOTE — ED Notes (Signed)
MD at bedside. 

## 2015-12-30 NOTE — ED Notes (Signed)
Pt c/o rt foot pain after tripping over shoe.

## 2015-12-31 ENCOUNTER — Emergency Department (HOSPITAL_COMMUNITY): Payer: Medicaid Other

## 2015-12-31 MED ORDER — OXYCODONE-ACETAMINOPHEN 5-325 MG PO TABS: 1.0000 | ORAL_TABLET | Freq: Four times a day (QID) | ORAL | Status: DC | PRN

## 2015-12-31 MED ORDER — FENTANYL CITRATE (PF) 100 MCG/2ML IJ SOLN
INTRAMUSCULAR | Status: AC | PRN
  Administered 2015-12-31: 50 ug via INTRAVENOUS

## 2015-12-31 MED ORDER — PROPOFOL 10 MG/ML IV BOLUS
INTRAVENOUS | Status: AC | PRN
  Administered 2015-12-31 (×2): 40 mg via INTRAVENOUS

## 2015-12-31 NOTE — Discharge Instructions (Signed)
Elevate your leg.  Use ice packs to keep the swelling down. You need to increase your aspirin to 325 mg a day to help prevent getting a blood clot again. Call Dr Mort Sawyers office to get an appointment. You had a " trimalleolar fracture dislocation of your right ankle". Leave the splint on until you see the orthopedist. Use the crutches or wheelchair, do not put weight on the splint.    Ankle Fracture A fracture is a break in a bone. A cast or splint may be used to protect the ankle and heal the break. Sometimes, surgery is needed. HOME CARE  Use crutches as told by your doctor. It is very important that you use your crutches correctly.  Do not put weight or pressure on the injured ankle until told by your doctor.  Keep your ankle raised (elevated) when sitting or lying down.  Apply ice to the ankle:  Put ice in a plastic bag.  Place a towel between your cast and the bag.  Leave the ice on for 20 minutes, 2-3 times a day.  If you have a plaster or fiberglass cast:  Do not try to scratch under the cast with any objects.  Check the skin around the cast every day. You may put lotion on red or sore areas.  Keep your cast dry and clean.  If you have a plaster splint:  Wear the splint as told by your doctor.  You can loosen the elastic around the splint if your toes get numb, tingle, or turn cold or blue.  Do not put pressure on any part of your cast or splint. It may break. Rest your plaster splint or cast only on a pillow the first 24 hours until it is fully hardened.  Cover your cast or splint with a plastic bag during showers.  Do not lower your cast or splint into water.  Take medicine as told by your doctor.  Do not drive until your doctor says it is safe.  Follow-up with your doctor as told. It is very important that you go to your follow-up visits. GET HELP IF: The swelling and discomfort gets worse.  GET HELP RIGHT AWAY IF:   Your splint or cast breaks.  You  continue to have very bad pain.  You have new pain or swelling after your splint or cast was put on.  Your skin or toes below the injured ankle:  Turn blue or gray.  Feel cold, numb, or you cannot feel them.  There is a bad smell or yellowish white fluid (pus) coming from under the splint or cast. MAKE SURE YOU:   Understand these instructions.  Will watch your condition.  Will get help right away if you are not doing well or get worse.   This information is not intended to replace advice given to you by your health care provider. Make sure you discuss any questions you have with your health care provider.   Document Released: 03/26/2009 Document Revised: 03/19/2013 Document Reviewed: 12/26/2012 Elsevier Interactive Patient Education 2016 [employer]  Ankle Dislocation Ankle dislocation happens when the ankle bones move out of place. Usually, the injury that causes ankle dislocation also causes other injuries that are more serious. Often these injuries are broken ankle bones. You also may have injured nerves and blood vessels.  Your doctor will put your ankle back in place. Any tears on your skin around your ankle will be closed (this is usually done in surgery). A cast or  splint will be placed around your ankle to hold it in place while it heals. Sometimes, screws and plates need to be drilled into your ankle bones to hold your ankle in place. Sometimes, pins are drilled into the bones of your lower leg and foot. These pins attach to a metal bar outside your body. The pins and the bar hold your bones in place until surgery can be done. HOME CARE  Rest your injured joint. Do not move it.  Put ice on your injured joint for 1 to 2 days or as told by your doctor.  Put ice in a plastic bag.  Place a towel between your skin and the bag.  Leave the ice on for 15 to 20 minutes, every 2 hours while your are awake.  Raise your ankle above your heart as told by your doctor. This helps  limit puffiness.  Move your toes as told by your doctor so they do not get stiff.  Only take medicines as told by your doctor. GET HELP RIGHT AWAY IF:  Your cast or splint becomes loose or damaged.  Your screws, plates, or the metal bar outside your body becomes loose or damaged.  You notice fluid draining around any pins.  Your pain becomes worse, not better.  You lose feeling in your toe or cannot bend the tip of your toe. MAKE SURE YOU:   Understand these instructions.  Will watch your condition.  Will get help right away if you are not doing well or get worse.   This information is not intended to replace advice given to you by your health care provider. Make sure you discuss any questions you have with your health care provider.   Document Released: 01/25/2011 Document Revised: 06/19/2014 Document Reviewed: 12/29/2014 Elsevier Interactive Patient Education 2016 [employer]  Cast or Splint Care Casts and splints support injured limbs and keep bones from moving while they heal.  HOME CARE  Keep the cast or splint uncovered during the drying period.  A plaster cast can take 24 to 48 hours to dry.  A fiberglass cast will dry in less than 1 hour.  Do not rest the cast on anything harder than a pillow for 24 hours.  Do not put weight on your injured limb. Do not put pressure on the cast. Wait for your doctor's approval.  Keep the cast or splint dry.  Cover the cast or splint with a plastic bag during baths or wet weather.  If you have a cast over your chest and belly (trunk), take sponge baths until the cast is taken off.  If your cast gets wet, dry it with a towel or blow dryer. Use the cool setting on the blow dryer.  Keep your cast or splint clean. Wash a dirty cast with a damp cloth.  Do not put any objects under your cast or splint.  Do not scratch the skin under the cast with an object. If itching is a problem, use a blow dryer on a cool setting over the  itchy area.  Do not trim or cut your cast.  Do not take out the padding from inside your cast.  Exercise your joints near the cast as told by your doctor.  Raise (elevate) your injured limb on 1 or 2 pillows for the first 1 to 3 days. GET HELP IF:  Your cast or splint cracks.  Your cast or splint is too tight or too loose.  You itch badly under the cast.  Your cast gets wet or has a soft spot.  You have a bad smell coming from the cast.  You get an object stuck under the cast.  Your skin around the cast becomes red or sore.  You have new or more pain after the cast is put on. GET HELP RIGHT AWAY IF:  You have fluid leaking through the cast.  You cannot move your fingers or toes.  Your fingers or toes turn blue or white or are cool, painful, or puffy (swollen).  You have tingling or lose feeling (numbness) around the injured area.  You have bad pain or pressure under the cast.  You have trouble breathing or have shortness of breath.  You have chest pain.   This information is not intended to replace advice given to you by your health care provider. Make sure you discuss any questions you have with your health care provider.   Document Released: 09/28/2010 Document Revised: 01/29/2013 Document Reviewed: 12/05/2012 Elsevier Interactive Patient Education 2016 [employer]  Cryotherapy Cryotherapy is when you put ice on your injury. Ice helps lessen pain and puffiness (swelling) after an injury. Ice works the best when you start using it in the first 24 to 48 hours after an injury. HOME CARE  Put a dry or damp towel between the ice pack and your skin.  You may press gently on the ice pack.  Leave the ice on for no more than 10 to 20 minutes at a time.  Check your skin after 5 minutes to make sure your skin is okay.  Rest at least 20 minutes between ice pack uses.  Stop using ice when your skin loses feeling (numbness).  Do not use ice on someone who cannot  tell you when it hurts. This includes small children and people with memory problems (dementia). GET HELP RIGHT AWAY IF:  You have white spots on your skin.  Your skin turns blue or pale.  Your skin feels waxy or hard.  Your puffiness gets worse. MAKE SURE YOU:   Understand these instructions.  Will watch your condition.  Will get help right away if you are not doing well or get worse.   This information is not intended to replace advice given to you by your health care provider. Make sure you discuss any questions you have with your health care provider.   Document Released: 11/15/2007 Document Revised: 08/21/2011 Document Reviewed: 01/19/2011 Elsevier Interactive Patient Education [employer]

## 2015-12-31 NOTE — ED Provider Notes (Addendum)
CSN: [id number]     Arrival date & time 12/30/15  2236 History   First MD Initiated Contact with Patient 12/30/15 2312   Chief Complaint  Patient presents with  . Foot Pain     (Consider location/radiation/quality/duration/timing/severity/associated sxs/prior Treatment) HPI  Patient states tonight she tripped over her shoes and fell hurting her right foot which is actually her ankle and her right knee. She states she had a total knee replacement in 2005 and has been having problems with that knee since. She states she has a history of DVT after that surgery, and she had a staph infection afterwards also. She denies any other injuries such as hitting her head or loss of consciousness.   PCP Dr Kathaleen Bury Dr Yisroel Ramming  Past Medical History  Diagnosis Date  . Borderline hypertension   . Obesity   . Hyperlipidemia   . Fe deficiency anemia   . Hiatal hernia   . PSVT (paroxysmal supraventricular tachycardia) (HCC) 8/05  . Recurrent cystitis     Dr. Aldean Ast   . Chronic lower back pain   . Lumbar herniated disc     L4-L5   . History of mixed drug abuse   . Anxiety   . Arthritis   . Depression   . History of cardiac catheterization     Normal coronaries  . Heartburn   . Microscopic hematuria   . Personal history of venous thrombosis and embolism   . Hypothyroidism     [name]   . Chronic shoulder pain    Past Surgical History  Procedure Laterality Date  . Appendectomy  2004?or 11/03?  [name] Partial hysterectomy  2004    One ovary   . Right knee arthroscopy  8/05  . Right knee replacement  8/05  . Carpal tunnel release      x2   . Shoulder surgery    . Rectocele repair  05/2001  . Liver biopsy  2002    Dr. Katrinka Blazing   . Bladder repair    . Rectal prolapse repair    . Back surgery     Family History  Problem Relation Age of Onset  . Heart attack      Family history   . Coronary artery disease      Family History   . Breast cancer Mother   . Breast cancer Sister     Social History  Substance Use Topics  . Smoking status: Never Smoker   . Smokeless tobacco: Never Used  . Alcohol Use: No   Lives at home On disability  OB History    Gravida Para Term Preterm AB TAB SAB Ectopic Multiple Living   6 5 5  0 1 0 1 0 0 5     Review of Systems  All other systems reviewed and are negative.     Allergies  Clindamycin; Diphenhydramine hcl; and Naproxen  Home Medications   Prior to Admission medications   Medication Sig Start Date End Date Taking? Authorizing Provider  AMITIZA 24 MCG capsule TAKE ONE CAPSULE BY MOUTH TWICE A DAY 07/14/15   Mechele Claude, MD  aspirin EC 81 MG tablet Take by mouth. Take 81 mg by mouth daily.    Historical Provider, MD  atorvastatin (LIPITOR) 80 MG tablet TAKE ONE (1) TABLET EACH DAY 11/11/15   Mechele Claude, MD  Calcium Carbonate-Vitamin D (CALCIUM 500 + D) 500-125 MG-UNIT TABS Take by mouth. Take 1 tablet by mouth every morning.    Historical Provider, MD  chlorthalidone (HYGROTON) 25 MG tablet TAKE ONE TABLET BY MOUTH TWICE DAILY 12/13/15   Mechele Claude, MD  Cholecalciferol (VITAMIN D-3 PO) Take 1 capsule by mouth every morning.     Historical Provider, MD  clonazePAM (KLONOPIN) 2 MG tablet TAKE 1 TABLET TWICE DAILY AS NEEDED FOR ANXIETY 12/13/15   Frederica Kuster, MD  cyclobenzaprine (FLEXERIL) 10 MG tablet TAKE ONE TABLET BY MOUTH THREE TIMES DAILY AS NEEDED FOR MUSCLE SPASM 07/14/15   Mechele Claude, MD  ezetimibe-simvastatin (VYTORIN) 10-80 MG tablet Take 1 tablet by mouth daily.    Historical Provider, MD  furosemide (LASIX) 20 MG tablet TAKE ONE (1) TABLET EACH DAY 12/13/15   Mechele Claude, MD  levothyroxine (SYNTHROID, LEVOTHROID) 75 MCG tablet TAKE ONE (1) TABLET EACH DAY 08/11/15   Mechele Claude, MD  levothyroxine (SYNTHROID, LEVOTHROID) 88 MCG tablet Take 88 mcg by mouth daily.    Historical Provider, MD  metoprolol succinate (TOPROL XL) 50 MG 24 hr tablet Take 1 tablet (50 mg total) by mouth 2 (two) times daily. Take  with or immediately following a meal. 06/11/15   Jonelle Sidle, MD  nitrofurantoin (MACRODANTIN) 100 MG capsule Take 100 mg by mouth at bedtime.    Historical Provider, MD  omega-3 acid ethyl esters (LOVAZA) 1 G capsule Take 1 g by mouth 2 (two) times daily.    Historical Provider, MD  omeprazole (PRILOSEC) 20 MG capsule TAKE ONE CAPSULE BY MOUTH TWICE A DAY 12/29/15   Mechele Claude, MD  Ospemifene (OSPHENA) 60 MG TABS Take 1 tablet by mouth daily. Reported on 06/29/2015    Historical Provider, MD  oxyCODONE-acetaminophen (PERCOCET/ROXICET) 5-325 MG tablet Take 1 tablet by mouth every 6 (six) hours as needed for severe pain. 12/31/15   Devoria Albe, MD  oxyCODONE-acetaminophen (PERCOCET/ROXICET) 5-325 MG tablet Take 1 tablet by mouth every 6 (six) hours as needed for severe pain. 12/31/15   Devoria Albe, MD  potassium chloride SA (K-DUR,KLOR-CON) 20 MEQ tablet TAKE TWO TABLETS BY MOUTH TWICE DAILY 12/13/15   Mechele Claude, MD  PREMARIN vaginal cream APPLY ONCE WEEKLY 12/13/15   Mechele Claude, MD  RESTASIS 0.05 % ophthalmic emulsion INSTILL ONE DROP IN BOTH EYES TWICE DAILY 12/13/15   Mechele Claude, MD  triamterene-hydrochlorothiazide (MAXZIDE-25) 37.5-25 MG tablet Take 1 tablet by mouth daily.    Historical Provider, MD  Venlafaxine HCl 225 MG TB24 Take 1 tablet (225 mg total) by mouth daily. 06/29/15   Mechele Claude, MD  venlafaxine XR (EFFEXOR-XR) 75 MG 24 hr capsule TAKE 3 CAPSULES EVERY MORNING 12/06/15   Mechele Claude, MD   BP 116/58 mmHg  Pulse 92  Temp(Src) 97.6 F (36.4 C) (Temporal)  Resp 22  Ht 5' 5.5" (1.664 m)  Wt 215 lb (97.523 kg)  BMI 35.22 kg/m2  SpO2 97%  Vital signs normal   Physical Exam  Constitutional: She is oriented to person, place, and time. She appears well-developed and well-nourished.  Non-toxic appearance. She does not appear ill. No distress.  HENT:  Head: Normocephalic and atraumatic.  Right Ear: External ear normal.  Left Ear: External ear normal.  Nose: Nose normal.  No mucosal edema or rhinorrhea.  Mouth/Throat: Oropharynx is clear and moist and mucous membranes are normal. No dental abscesses or uvula swelling.  Eyes: Conjunctivae and EOM are normal. Pupils are equal, round, and reactive to light.  Neck: Normal range of motion and full passive range of motion without pain. Neck supple.  Cardiovascular: Normal rate, regular rhythm and normal  heart sounds.  Exam reveals no gallop and no friction rub.   No murmur heard. Pulmonary/Chest: Effort normal and breath sounds normal. No respiratory distress. She has no wheezes. She has no rhonchi. She has no rales. She exhibits no tenderness and no crepitus.  Abdominal: Soft. Normal appearance and bowel sounds are normal. She exhibits no distension. There is no tenderness. There is no rebound and no guarding.  Musculoskeletal: Normal range of motion. She exhibits edema and tenderness.  Moves all extremities well except for her right lower extremity. Patient has deformity of her right ankle. She has intact pulses. She has intact sensation but states it feels a little bit numb compared to the left. She also has some pain to her right knee without obvious deformity. She has a well-healed midline surgical scar over the right knee.  Neurological: She is alert and oriented to person, place, and time. She has normal strength. No cranial nerve deficit.  Skin: Skin is warm, dry and intact. No rash noted. No erythema. No pallor.  Psychiatric: She has a normal mood and affect. Her speech is normal and behavior is normal. Her mood appears not anxious.  Nursing note and vitals reviewed.   ED Course  Procedures (including critical care time)  Medications  fentaNYL (SUBLIMAZE) injection 50 mcg (50 mcg Intravenous Not Given 12/31/15 0020)  propofol (DIPRIVAN) 10 mg/mL bolus/IV push 200 mg (0 mg Intravenous Stopped 12/31/15 0024)  fentaNYL (SUBLIMAZE) injection (50 mcg Intravenous Given 12/31/15 0020)  propofol (DIPRIVAN) 10 mg/mL  bolus/IV push ( Intravenous Stopped 12/31/15 0042)    We discussed patient's x-ray results when I went into her room. We discussed the need for reduction by procedural sedation and she is agreeable. Patient had IV inserted and IV pain medication was ordered.  Patient states that she prefers to stay locally for care of this injury to her ankle. She was referred to Dr. Romeo Apple. Because of her history of DVT she was asked to increase her aspirin from 81 mg a day to 325 mg a day  Procedural sedation Performed by: Ward Givens Consent: Verbal consent obtained. Risks and benefits: risks, benefits and alternatives were discussed Required items: required blood products, implants, devices, and special equipment available Patient identity confirmed: arm band and provided demographic data Time out: Immediately prior to procedure a "time out" was called to verify the correct patient, procedure, equipment, support staff and site/side marked as required.  Sedation type: moderate (conscious) sedation NPO time confirmed and considedered  Sedatives: PROPOFOL 80 mg  Physician Time at Bedside: 20 min  Vitals: Vital signs were monitored during sedation. Cardiac Monitor, pulse oximeter Patient tolerance: Patient tolerated the procedure well with no immediate complications. Comments: Pt with uneventful recovered. Returned to pre-procedural sedation baseline   Reduction of dislocation Date/Time: 1:59 AM Performed by: Ward Givens Authorized by: Ward Givens Consent: Verbal consent obtained. Risks and benefits: risks, benefits and alternatives were discussed Consent given by: patient Required items: required blood products, implants, devices, and special equipment available Time out: Immediately prior to procedure a "time out" was called to verify the correct patient, procedure, equipment, support staff and site/side marked as required.  Patient sedated: see above  Vitals: Vital signs were monitored  during sedation. Patient tolerance: Patient tolerated the procedure well with no immediate complications. Joint: right ankle Reduction technique: distraction  Pt had posterior/stirrup splint placed by the nursing staff and myself.   review of the [employer] only shows regular prescriptions filled  for #60 clonazepam 2 mg tablets prescribed by her PCP.  Imaging Review Dg Ankle Complete Right  12/31/2015  CLINICAL DATA:  Postreduction ankle fracture.  Initial encounter. EXAM: RIGHT ANKLE - COMPLETE 3+ VIEW COMPARISON:  12/30/2015 FINDINGS: Distal fibular, posterior malleolus and medial malleolus fractures with improved alignment after ankle reduction. Posterior malleolus fracture remains superiorly displaced by 5 mm with articular surface offset. IMPRESSION: Relocated ankle with improved trimalleolar fracture alignment. Electronically Signed   By: Marnee SpringJonathon  Watts [name]   On: 12/31/2015 01:01   Dg Ankle Complete Right  12/30/2015  CLINICAL DATA:  Tripping injury, severe pain and swelling EXAM: RIGHT ANKLE - COMPLETE 3+ VIEW COMPARISON:  12/30/2015 right foot FINDINGS: There is an acute trimalleolar fracture dislocation of the right ankle. Tibia is dislocated anteriorly in relation to the talus. Talus and calcaneus appear intact. Diffuse soft tissue swelling. Plantar calcaneal spur noted. IMPRESSION: Acute right ankle trimalleolar fracture dislocation. Electronically Signed   By: Judie PetitM.  Shick [name]   On: 12/30/2015 23:12   Dg Knee Right Port  12/31/2015  CLINICAL DATA:  Post reduction of right ankle fracture. Trauma today. EXAM: PORTABLE RIGHT KNEE - 1-2 VIEW COMPARISON:  None. FINDINGS: Patient is status post right knee replacement. Hardware is in good position with no evidence of failure. No fracture or dislocation. No joint effusion. IMPRESSION: No acute abnormalities. Electronically Signed   By: Gerome Samavid  Williams III M.D   On: 12/31/2015 01:00   Dg Foot Complete Right  12/30/2015  CLINICAL  DATA:  Tripping injury, acute ankle pain and swelling EXAM: RIGHT FOOT COMPLETE - 3+ VIEW COMPARISON:  12/30/2015 FINDINGS: Diffuse right ankle and foot soft tissue swelling. There is acute trimalleolar fracture dislocation of the ankle. Bones are osteopenic. No additional acute fracture involving the foot. Small plantar calcaneal spur noted. IMPRESSION: Acute right ankle trimalleolar fracture dislocation Electronically Signed   By: Judie PetitM.  Shick [name]   On: 12/30/2015 23:07   I have personally reviewed and evaluated these images and lab results as part of my medical decision-making.    MDM   Final diagnoses:  Trimalleolar fracture of ankle, closed, right, initial encounter  Dislocation, ankle, right, initial encounter  Knee pain, acute, right    New Prescriptions   OXYCODONE-ACETAMINOPHEN (PERCOCET/ROXICET) 5-325 MG TABLET    Take 1 tablet by mouth every 6 (six) hours as needed for severe pain.   OXYCODONE-ACETAMINOPHEN (PERCOCET/ROXICET) 5-325 MG TABLET    Take 1 tablet by mouth every 6 (six) hours as needed for severe pain.    Plan discharge  Devoria AlbeIva Bethzaida Boord, MD, Concha PyoFACEP     Calder Oblinger, MD 12/31/15 [id number]  Devoria AlbeIva Keveon Amsler, MD 12/31/15 [id number]

## 2016-01-03 MED FILL — Oxycodone w/ Acetaminophen Tab 5-325 MG: ORAL | Qty: 6 | Status: AC

## 2016-01-04 ENCOUNTER — Other Ambulatory Visit: Payer: Self-pay | Admitting: Family Medicine

## 2016-01-04 ENCOUNTER — Ambulatory Visit: Payer: Medicaid Other | Admitting: Orthopedic Surgery

## 2016-01-05 ENCOUNTER — Ambulatory Visit (INDEPENDENT_AMBULATORY_CARE_PROVIDER_SITE_OTHER): Payer: Medicaid Other | Admitting: Orthopedic Surgery

## 2016-01-05 ENCOUNTER — Encounter: Payer: Self-pay | Admitting: *Deleted

## 2016-01-05 VITALS — BP 87/60 | HR 75

## 2016-01-05 DIAGNOSIS — S82891A Other fracture of right lower leg, initial encounter for closed fracture: Secondary | ICD-10-CM

## 2016-01-05 DIAGNOSIS — S82851A Displaced trimalleolar fracture of right lower leg, initial encounter for closed fracture: Secondary | ICD-10-CM

## 2016-01-05 MED ORDER — OXYCODONE-ACETAMINOPHEN 5-325 MG PO TABS: 1.0000 | ORAL_TABLET | Freq: Four times a day (QID) | ORAL | 0 refills | Status: DC | PRN

## 2016-01-06 ENCOUNTER — Telehealth: Payer: Self-pay | Admitting: *Deleted

## 2016-01-06 NOTE — Telephone Encounter (Signed)
-----   Message from Jonelle Sidle, MD sent at 01/06/2016  7:35 AM EDT ----- Can you please call the patient and see how she is doing as far as PSVT (see my last note 05/2015). If her symptoms have been stable, she probably does not need an office visit and I can send a note to Dr. Romeo Apple about cardiac clearance.

## 2016-01-06 NOTE — Progress Notes (Signed)
Chief Complaint  Patient presents with  . New Patient (Initial Visit)    ER FOLLOW UP RIGHT ANKLE FRACTURE   HPI 65 year old female with multiple medical problems as listed below comes in with history of fall injuring the right ankle July 20. She had closed reduction the emergency room patient splint with good reduction.  Complains of right ankle pain dull throbbing severe 7 days duration constant  Review of Systems  Constitutional: Negative for chills, fever and malaise/fatigue.  Respiratory: Negative for shortness of breath.   Cardiovascular: Negative for chest pain.  Musculoskeletal: Positive for myalgias.  Skin: Negative for itching and rash.    Past Medical History:  Diagnosis Date  . Anxiety   . Arthritis   . Borderline hypertension   . Chronic lower back pain   . Chronic shoulder pain   . Depression   . Fe deficiency anemia   . Heartburn   . Hiatal hernia   . History of cardiac catheterization    Normal coronaries  . History of mixed drug abuse   . Hyperlipidemia   . Hypothyroidism    [name]   . Lumbar herniated disc    L4-L5   . Microscopic hematuria   . Obesity   . Personal history of venous thrombosis and embolism   . PSVT (paroxysmal supraventricular tachycardia) (HCC) 8/05  . Recurrent cystitis    Dr. Aldean Ast     Past Surgical History:  Procedure Laterality Date  . APPENDECTOMY  2004?or 11/03?  [name] BACK SURGERY    . BLADDER REPAIR    . CARPAL TUNNEL RELEASE     x2   . LIVER BIOPSY  2002   Dr. Katrinka Blazing   . PARTIAL HYSTERECTOMY  2004   One ovary   . RECTAL PROLAPSE REPAIR    . RECTOCELE REPAIR  05/2001  . Right knee arthroscopy  8/05  . Right knee replacement  8/05  . SHOULDER SURGERY     Family History  Problem Relation Age of Onset  . Heart attack      Family history   . Coronary artery disease      Family History   . Breast cancer Mother   . Breast cancer Sister    Social History  Substance Use Topics  . Smoking status: Never Smoker   . Smokeless tobacco: Never Used  . Alcohol use No    Current Outpatient Prescriptions:  .  AMITIZA 24 MCG capsule, TAKE ONE CAPSULE BY MOUTH TWICE A DAY, Disp: 60 capsule, Rfl: 5 .  aspirin EC 81 MG tablet, Take by mouth. Take 81 mg by mouth daily., Disp: , Rfl:  .  atorvastatin (LIPITOR) 80 MG tablet, TAKE ONE (1) TABLET EACH DAY, Disp: 90 tablet, Rfl: 0 .  Calcium Carbonate-Vitamin D (CALCIUM 500 + D) 500-125 MG-UNIT TABS, Take by mouth. Take 1 tablet by mouth every morning., Disp: , Rfl:  .  chlorthalidone (HYGROTON) 25 MG tablet, TAKE ONE TABLET BY MOUTH TWICE DAILY, Disp: 60 tablet, Rfl: 0 .  Cholecalciferol (VITAMIN D-3 PO), Take 1 capsule by mouth every morning. , Disp: , Rfl:  .  clonazePAM (KLONOPIN) 2 MG tablet, TAKE 1 TABLET TWICE DAILY AS NEEDED FOR ANXIETY, Disp: 60 tablet, Rfl: 0 .  cyclobenzaprine (FLEXERIL) 10 MG tablet, TAKE ONE TABLET BY MOUTH THREE TIMES DAILY AS NEEDED FOR MUSCLE SPASM, Disp: 90 tablet, Rfl: 5 .  ezetimibe-simvastatin (VYTORIN) 10-80 MG tablet, Take 1 tablet by mouth daily., Disp: , Rfl:  .  furosemide (LASIX) 20 MG tablet, TAKE ONE (1) TABLET EACH DAY, Disp: 30 tablet, Rfl: 0 .  levothyroxine (SYNTHROID, LEVOTHROID) 75 MCG tablet, TAKE ONE (1) TABLET EACH DAY, Disp: 90 tablet, Rfl: 2 .  levothyroxine (SYNTHROID, LEVOTHROID) 88 MCG tablet, Take 88 mcg by mouth daily., Disp: , Rfl:  .  metoprolol succinate (TOPROL XL) 50 MG 24 hr tablet, Take 1 tablet (50 mg total) by mouth 2 (two) times daily. Take with or immediately following a meal., Disp: 180 tablet, Rfl: 3 .  nitrofurantoin (MACRODANTIN) 100 MG capsule, Take 100 mg by mouth at bedtime., Disp: , Rfl:  .  omega-3 acid ethyl esters (LOVAZA) 1 G capsule, Take 1 g by mouth 2 (two) times daily., Disp: , Rfl:  .  omeprazole (PRILOSEC) 20 MG capsule, TAKE ONE CAPSULE BY MOUTH TWICE A DAY, Disp: 60 capsule, Rfl: 0 .  Ospemifene (OSPHENA) 60 MG TABS, Take 1 tablet by mouth daily. Reported on 06/29/2015, Disp: ,  Rfl:  .  oxyCODONE-acetaminophen (PERCOCET/ROXICET) 5-325 MG tablet, Take 1 tablet by mouth every 6 (six) hours as needed for severe pain., Disp: 30 tablet, Rfl: 0 .  potassium chloride SA (K-DUR,KLOR-CON) 20 MEQ tablet, TAKE TWO TABLETS BY MOUTH TWICE DAILY, Disp: 120 tablet, Rfl: 0 .  PREMARIN vaginal cream, APPLY ONCE WEEKLY, Disp: 30 g, Rfl: 0 .  RESTASIS 0.05 % ophthalmic emulsion, INSTILL ONE DROP IN BOTH EYES TWICE DAILY, Disp: 60 each, Rfl: 0 .  triamterene-hydrochlorothiazide (MAXZIDE-25) 37.5-25 MG tablet, Take 1 tablet by mouth daily., Disp: , Rfl:  .  Venlafaxine HCl 225 MG TB24, Take 1 tablet (225 mg total) by mouth daily., Disp: 30 each, Rfl: 5 .  venlafaxine XR (EFFEXOR-XR) 75 MG 24 hr capsule, TAKE 3 CAPSULES EVERY MORNING, Disp: 90 capsule, Rfl: 0  BP (!) 87/60 (BP Location: Left Arm, Patient Position: Sitting, Cuff Size: Large)   Pulse 75   Physical Exam  Constitutional: She is oriented to person, place, and time. She appears well-developed and well-nourished. No distress.  HENT:  Head: Normocephalic and atraumatic.  Right Ear: External ear normal.  Left Ear: External ear normal.  Nose: Nose normal.  Mouth/Throat: Oropharynx is clear and moist.  Eyes: Conjunctivae and EOM are normal. Pupils are equal, round, and reactive to light. Right eye exhibits no discharge. Left eye exhibits no discharge. No scleral icterus.  Neck: Normal range of motion. Neck supple. No JVD present. No tracheal deviation present. No thyromegaly present.  Cardiovascular: Normal rate and intact distal pulses.   Pulmonary/Chest: Effort normal. No respiratory distress. She has no wheezes. She exhibits no tenderness.  Abdominal: Soft. She exhibits no distension and no mass. There is no guarding. No hernia.  Lymphadenopathy:    She has no cervical adenopathy.  Neurological: She is alert and oriented to person, place, and time. She has normal reflexes.  Skin: Capillary refill takes less than 2 seconds.  She is not diaphoretic.  Psychiatric: She has a normal mood and affect. Her behavior is normal. Judgment and thought content normal.  . We could not test balance because she cannot stand up. She has normal upper extremity coordination moving point to point  Ortho Exam Right and left Upper extremities are well aligned without contracture subluxation atrophy tremor skin rash or nodules. Pulse and color normal. Sensation normal. A left leg shows a bruise in the posterior thigh it is tender there is no crepitance or tenderness in the other portions of the lower extremity. Range of motion stability  and strength is normal skin is normal otherwise. Pulse and temperature normal sensation normal. ASSESSMENT: My personal interpretation of the images:  Imaging shows a trimalleolar fracture right ankle unstable will need surgery    PLAN Plan is for medical clearance and then schedule patient for open treatment internal fixation  This procedure has been fully reviewed with the patient and written informed consent has been obtained. Risk of infection stiffness hardware removal later nonunion has been discussed.  [name], MD 01/06/2016 8:30 AM

## 2016-01-06 NOTE — Telephone Encounter (Signed)
Spoke with patient to f/u on symptoms of PSVT. Patient said she was doing fine with the dose increase of toprol xl and has not had any problems or episodes of PSVT. Patient denies having palpitations, chest pain, dizziness or sob.

## 2016-01-07 NOTE — Telephone Encounter (Signed)
Please place note in chart for anesthesia

## 2016-01-10 ENCOUNTER — Telehealth: Payer: Self-pay | Admitting: Orthopedic Surgery

## 2016-01-10 NOTE — Telephone Encounter (Signed)
Patient is asking if she has been scheduled for surgery.  Please call and advise

## 2016-01-10 NOTE — Telephone Encounter (Signed)
Please advise that we are still awaiting clearance notification from her pcp and cardiology

## 2016-01-11 ENCOUNTER — Encounter: Payer: Self-pay | Admitting: Family Medicine

## 2016-01-11 ENCOUNTER — Other Ambulatory Visit: Payer: Self-pay | Admitting: Family Medicine

## 2016-01-11 ENCOUNTER — Ambulatory Visit (INDEPENDENT_AMBULATORY_CARE_PROVIDER_SITE_OTHER): Payer: Medicaid Other | Admitting: Family Medicine

## 2016-01-11 VITALS — BP 108/59 | HR 72 | Temp 97.5°F | Ht 66.0 in | Wt 225.0 lb

## 2016-01-11 DIAGNOSIS — F329 Major depressive disorder, single episode, unspecified: Secondary | ICD-10-CM

## 2016-01-11 DIAGNOSIS — F411 Generalized anxiety disorder: Secondary | ICD-10-CM | POA: Diagnosis not present

## 2016-01-11 DIAGNOSIS — E559 Vitamin D deficiency, unspecified: Secondary | ICD-10-CM | POA: Diagnosis not present

## 2016-01-11 DIAGNOSIS — E669 Obesity, unspecified: Secondary | ICD-10-CM | POA: Diagnosis not present

## 2016-01-11 DIAGNOSIS — E785 Hyperlipidemia, unspecified: Secondary | ICD-10-CM

## 2016-01-11 DIAGNOSIS — E039 Hypothyroidism, unspecified: Secondary | ICD-10-CM | POA: Diagnosis not present

## 2016-01-11 DIAGNOSIS — I10 Essential (primary) hypertension: Secondary | ICD-10-CM

## 2016-01-11 DIAGNOSIS — I517 Cardiomegaly: Secondary | ICD-10-CM | POA: Diagnosis not present

## 2016-01-11 DIAGNOSIS — F32A Depression, unspecified: Secondary | ICD-10-CM

## 2016-01-11 NOTE — Progress Notes (Signed)
Subjective:  Patient ID: [name], female    DOB: 10-Mar-1951  Age: 65 y.o. MRN: [id number]  CC: surgical clearance   HPI [name] presents for [facility] a trimalleolar fracture of the right ankle on about July 21. She was seen a few days later by orthopedics. Diagnosed at that time. She is now in a cast in a wheelchair nonweightbearing. She is in today for preop clearance. Of note is that she has a history of left ventricular hypertrophy, hypothyroidism and hypertension.   follow-up of hypertension. Patient has no history of headache chest pain or shortness of breath or recent cough. Patient also denies symptoms of TIA such as numbness weakness lateralizing. Patient checks  blood pressure at home and has not had any elevated readings recently. Patient denies side effects from his medication. States taking it regularly.   Patient in for follow-up of elevated cholesterol. Doing well without complaints on current medication. Denies side effects of statin including myalgia and arthralgia and nausea. Also in today for liver function testing. Currently no chest pain, shortness of breath or other cardiovascular related symptoms noted.  Patient presents for follow-up on  thyroid. The patient has a history of hypothyroidism for many years. It has been stable recently. Pt. denies any change in  voice, loss of hair, heat or cold intolerance. Energy level has been adequate to good. Patient denies constipation and diarrhea. No myxedema. Medication is as noted below. Verified that pt is taking it daily on an empty stomach. Well tolerated.  Depression screen [facility] 2/9 01/11/2016 06/29/2015 03/24/2015 12/22/2014 09/23/2014  Decreased Interest 3 3 1 3 1   Down, Depressed, Hopeless 3 3 3 3 2   PHQ - 2 Score 6 6 4 6 3   Altered sleeping 3 1 0 3 3  Tired, decreased energy 3 3 3 3 3   Change in appetite 2 2 0 0 1  Feeling bad or failure about yourself  3 2 3  0 2  Trouble concentrating 3 1 1  0 1  Moving slowly  or fidgety/restless 2 0 0 0 0  Suicidal thoughts 0 0 0 0 0  PHQ-9 Score 22 15 11 12 13   Difficult doing work/chores Somewhat difficult Somewhat difficult Somewhat difficult - -    History [name] has a past medical history of Anxiety; Arthritis; Borderline hypertension; Chronic lower back pain; Chronic shoulder pain; Depression; Fe deficiency anemia; Heartburn; Hiatal hernia; History of cardiac catheterization; History of mixed drug abuse; Hyperlipidemia; Hypothyroidism; Lumbar herniated disc; Microscopic hematuria; Obesity; Personal history of venous thrombosis and embolism; PSVT (paroxysmal supraventricular tachycardia) ([name]) (8/05); and Recurrent cystitis.   She has a past surgical history that includes Appendectomy (2004?or 11/03?); Partial hysterectomy (2004); Right knee arthroscopy (8/05); Right knee replacement (8/05); Carpal tunnel release; Shoulder surgery; Rectocele repair (05/2001); Liver biopsy (2002); Bladder repair; Rectal prolapse repair; and Back surgery.   Her family history includes Breast cancer in her mother and sister.She reports that she has never smoked. She has never used smokeless tobacco. She reports that she does not drink alcohol or use drugs.    ROS Review of Systems  Constitutional: Negative for activity change, appetite change and fever.  HENT: Negative for congestion, rhinorrhea and sore throat.   Eyes: Negative for visual disturbance.  Respiratory: Negative for cough and shortness of breath.   Cardiovascular: Negative for chest pain and palpitations.  Gastrointestinal: Negative for abdominal pain, diarrhea and nausea.  Genitourinary: Negative for dysuria.  Musculoskeletal: Negative for arthralgias and myalgias.    Objective:  BP (!) 108/59 (BP Location: Left Arm, Patient Position: Sitting, Cuff Size: Normal)   Pulse 72   Temp 97.5 F (36.4 C) (Oral)   Ht 5' 6"  (1.676 m)   Wt 225 lb (102.1 kg)   SpO2 99%   BMI 36.32 kg/m   BP Readings from Last 3  Encounters:  01/11/16 (!) 108/59  01/05/16 (!) 87/60  12/31/15 116/58    Wt Readings from Last 3 Encounters:  01/11/16 225 lb (102.1 kg)  12/30/15 215 lb (97.5 kg)  06/29/15 225 lb 9.6 oz (102.3 kg)     Physical Exam  Constitutional: She is oriented to person, place, and time. She appears well-developed and well-nourished. No distress.  HENT:  Head: Normocephalic and atraumatic.  Right Ear: External ear normal.  Left Ear: External ear normal.  Nose: Nose normal.  Mouth/Throat: Oropharynx is clear and moist.  Eyes: Conjunctivae and EOM are normal. Pupils are equal, round, and reactive to light.  Neck: Normal range of motion. Neck supple. No thyromegaly present.  Cardiovascular: Normal rate, regular rhythm and normal heart sounds.   No murmur heard. Pulmonary/Chest: Effort normal and breath sounds normal. No respiratory distress. She has no wheezes. She has no rales.  Abdominal: Soft. Bowel sounds are normal. She exhibits no distension. There is no tenderness.  Lymphadenopathy:    She has no cervical adenopathy.  Neurological: She is alert and oriented to person, place, and time. She has normal reflexes.  Skin: Skin is warm and dry.  Psychiatric: She has a normal mood and affect. Her behavior is normal. Judgment and thought content normal.     Lab Results  Component Value Date   WBC 7.8 06/29/2015   HGB 14.2 12/22/2014   HCT 42.1 06/29/2015   PLT 237 06/29/2015   GLUCOSE 88 06/29/2015   CHOL 203 (H) 06/29/2015   TRIG 150 (H) 06/29/2015   HDL 66 06/29/2015   LDLCALC 107 (H) 06/29/2015   ALT 20 06/29/2015   AST 21 06/29/2015   NA 142 06/29/2015   K 3.4 (L) 06/29/2015   CL 98 06/29/2015   CREATININE 0.95 06/29/2015   BUN 22 06/29/2015   CO2 24 06/29/2015   TSH 2.080 06/29/2015   HGBA1C 5.5 12/09/2013    Dg Ankle Complete Right  Result Date: 12/31/2015 CLINICAL DATA:  Postreduction ankle fracture.  Initial encounter. EXAM: RIGHT ANKLE - COMPLETE 3+ VIEW  COMPARISON:  12/30/2015 FINDINGS: Distal fibular, posterior malleolus and medial malleolus fractures with improved alignment after ankle reduction. Posterior malleolus fracture remains superiorly displaced by 5 mm with articular surface offset. IMPRESSION: Relocated ankle with improved trimalleolar fracture alignment. Electronically Signed   By: Monte Fantasia [name]   On: 12/31/2015 01:01   Dg Ankle Complete Right  Result Date: 12/30/2015 CLINICAL DATA:  Tripping injury, severe pain and swelling EXAM: RIGHT ANKLE - COMPLETE 3+ VIEW COMPARISON:  12/30/2015 right foot FINDINGS: There is an acute trimalleolar fracture dislocation of the right ankle. Tibia is dislocated anteriorly in relation to the talus. Talus and calcaneus appear intact. Diffuse soft tissue swelling. Plantar calcaneal spur noted. IMPRESSION: Acute right ankle trimalleolar fracture dislocation. Electronically Signed   By: Jerilynn Mages.  Shick [name]   On: 12/30/2015 23:12   Dg Knee Right Port  Result Date: 12/31/2015 CLINICAL DATA:  Post reduction of right ankle fracture. Trauma today. EXAM: PORTABLE RIGHT KNEE - 1-2 VIEW COMPARISON:  None. FINDINGS: Patient is status post right knee replacement. Hardware is in good position with no evidence of failure.  No fracture or dislocation. No joint effusion. IMPRESSION: No acute abnormalities. Electronically Signed   By: Dorise Bullion III M.D   On: 12/31/2015 01:00   Dg Foot Complete Right  Result Date: 12/30/2015 CLINICAL DATA:  Tripping injury, acute ankle pain and swelling EXAM: RIGHT FOOT COMPLETE - 3+ VIEW COMPARISON:  12/30/2015 FINDINGS: Diffuse right ankle and foot soft tissue swelling. There is acute trimalleolar fracture dislocation of the ankle. Bones are osteopenic. No additional acute fracture involving the foot. Small plantar calcaneal spur noted. IMPRESSION: Acute right ankle trimalleolar fracture dislocation Electronically Signed   By: Jerilynn Mages.  Shick [name]   On: 12/30/2015 23:07    Assessment &  Plan:   Catrena was seen today for surgical clearance.  Diagnoses and all orders for this visit:  Essential hypertension, benign -     CBC with Differential/Platelet -     CMP14+EGFR  Hyperlipemia -     CBC with Differential/Platelet -     CMP14+EGFR  Hypothyroidism, unspecified hypothyroidism type -     CBC with Differential/Platelet -     CMP14+EGFR -     TSH + free T4  Depression -     CBC with Differential/Platelet -     CMP14+EGFR  GAD (generalized anxiety disorder) -     CBC with Differential/Platelet -     CMP14+EGFR  LVH (left ventricular hypertrophy) -     CBC with Differential/Platelet -     CMP14+EGFR  Obesity -     CBC with Differential/Platelet -     CMP14+EGFR  Vitamin D deficiency -     CBC with Differential/Platelet -     CMP14+EGFR -     VITAMIN D 25 Hydroxy (Vit-D Deficiency, Fractures)      I have discontinued Ms. Morrish's Venlafaxine HCl. I am also having her maintain her Cholecalciferol (VITAMIN D-3 PO), omega-3 acid ethyl esters, Calcium Carbonate-Vitamin D, aspirin EC, nitrofurantoin, Ospemifene, levothyroxine, triamterene-hydrochlorothiazide, ezetimibe-simvastatin, metoprolol succinate, levothyroxine, atorvastatin, clonazePAM, omeprazole, venlafaxine XR, oxyCODONE-acetaminophen, potassium chloride SA, chlorthalidone, furosemide, PREMARIN, RESTASIS, AMITIZA, and cyclobenzaprine.  No orders of the defined types were placed in this encounter.    Follow-up: Return in about 6 weeks (around 02/22/2016) for Depression, hypertension, Hypothyroidism.  Claretta Fraise, [name]

## 2016-01-12 ENCOUNTER — Telehealth: Payer: Self-pay

## 2016-01-12 ENCOUNTER — Other Ambulatory Visit: Payer: Self-pay | Admitting: Family Medicine

## 2016-01-12 ENCOUNTER — Telehealth: Payer: Self-pay | Admitting: Orthopedic Surgery

## 2016-01-12 LAB — CBC WITH DIFFERENTIAL/PLATELET
BASOS ABS: 0 10*3/uL (ref 0.0–0.2)
Basos: 0 %
EOS (ABSOLUTE): 0.1 10*3/uL (ref 0.0–0.4)
EOS: 2 %
HEMATOCRIT: 38 % (ref 34.0–46.6)
HEMOGLOBIN: 12.7 g/dL (ref 11.1–15.9)
IMMATURE GRANULOCYTES: 0 %
Immature Grans (Abs): 0 10*3/uL (ref 0.0–0.1)
LYMPHS ABS: 2.7 10*3/uL (ref 0.7–3.1)
Lymphs: 35 %
MCH: 30 pg (ref 26.6–33.0)
MCHC: 33.4 g/dL (ref 31.5–35.7)
MCV: 90 fL (ref 79–97)
MONOCYTES: 8 %
Monocytes Absolute: 0.6 10*3/uL (ref 0.1–0.9)
NEUTROS PCT: 55 %
Neutrophils Absolute: 4.1 10*3/uL (ref 1.4–7.0)
Platelets: 269 10*3/uL (ref 150–379)
RBC: 4.23 x10E6/uL (ref 3.77–5.28)
RDW: 13.3 % (ref 12.3–15.4)
WBC: 7.6 10*3/uL (ref 3.4–10.8)

## 2016-01-12 LAB — CMP14+EGFR
ALBUMIN: 3.7 g/dL (ref 3.6–4.8)
ALK PHOS: 115 IU/L (ref 39–117)
ALT: 20 IU/L (ref 0–32)
AST: 17 IU/L (ref 0–40)
Albumin/Globulin Ratio: 1.2 (ref 1.2–2.2)
BUN/Creatinine Ratio: 21 (ref 12–28)
BUN: 21 mg/dL (ref 8–27)
Bilirubin Total: 0.4 mg/dL (ref 0.0–1.2)
CALCIUM: 9.7 mg/dL (ref 8.7–10.3)
CO2: 28 mmol/L (ref 18–29)
CREATININE: 0.99 mg/dL (ref 0.57–1.00)
Chloride: 97 mmol/L (ref 96–106)
GFR calc Af Amer: 70 mL/min/{1.73_m2} (ref >59)
GFR, EST NON AFRICAN AMERICAN: 60 mL/min/{1.73_m2} (ref >59)
GLOBULIN, TOTAL: 3 g/dL (ref 1.5–4.5)
GLUCOSE: 88 mg/dL (ref 65–99)
Potassium: 3.4 mmol/L — ABNORMAL LOW (ref 3.5–5.2)
Sodium: 142 mmol/L (ref 134–144)
Total Protein: 6.7 g/dL (ref 6.0–8.5)

## 2016-01-12 LAB — TSH+FREE T4
FREE T4: 1.34 ng/dL (ref 0.82–1.77)
TSH: 4.56 u[IU]/mL — AB (ref 0.450–4.500)

## 2016-01-12 LAB — VITAMIN D 25 HYDROXY (VIT D DEFICIENCY, FRACTURES): Vit D, 25-Hydroxy: 32.7 ng/mL (ref 30.0–100.0)

## 2016-01-12 NOTE — Telephone Encounter (Signed)
Wants to know when hip surgery will be scheduled.  Told her it would be after Monday when she gets a call back

## 2016-01-12 NOTE — Telephone Encounter (Signed)
Routing to Dr, Romeo Apple to advise

## 2016-01-12 NOTE — Telephone Encounter (Signed)
Patient called and requested a refill on oxycodone/Acetainophen (Percocet) 5-325 mgs.  Qty  30  Sig: Take 1 tablet by mouth every 6 (six) hours as needed for severe pain.

## 2016-01-13 ENCOUNTER — Telehealth: Payer: Self-pay

## 2016-01-13 ENCOUNTER — Other Ambulatory Visit: Payer: Self-pay | Admitting: *Deleted

## 2016-01-13 DIAGNOSIS — S82891A Other fracture of right lower leg, initial encounter for closed fracture: Secondary | ICD-10-CM

## 2016-01-13 MED ORDER — OXYCODONE-ACETAMINOPHEN 5-325 MG PO TABS: 1.0000 | ORAL_TABLET | Freq: Four times a day (QID) | ORAL | 0 refills | Status: DC | PRN

## 2016-01-13 NOTE — Telephone Encounter (Signed)
Approved for 30 tablets

## 2016-01-14 ENCOUNTER — Other Ambulatory Visit: Payer: Self-pay | Admitting: *Deleted

## 2016-01-14 ENCOUNTER — Telehealth: Payer: Self-pay | Admitting: Family Medicine

## 2016-01-14 MED ORDER — CLONAZEPAM 2 MG PO TABS: ORAL_TABLET | ORAL | 1 refills | Status: DC

## 2016-01-14 NOTE — Telephone Encounter (Signed)
[facility] Dr Darlyn Read

## 2016-01-14 NOTE — Telephone Encounter (Signed)
Pt questioning surgical appt Please review and advise

## 2016-01-14 NOTE — Telephone Encounter (Signed)
Please review and advise.

## 2016-01-17 NOTE — Telephone Encounter (Signed)
x

## 2016-01-17 NOTE — Telephone Encounter (Signed)
Routing to Dr. Harrison to advise 

## 2016-01-18 ENCOUNTER — Telehealth: Payer: Self-pay | Admitting: Orthopedic Surgery

## 2016-01-18 NOTE — Telephone Encounter (Signed)
Patient's daughter called and stated that they wanted to know when [name]'s surgery was scheduled.  Please call them.

## 2016-01-18 NOTE — Telephone Encounter (Signed)
PLAN Plan is for medical clearance and then schedule patient for open treatment internal fixation  This procedure has been fully reviewed with the patient and written informed consent has been obtained. Risk of infection stiffness hardware removal later nonunion has been discussed.  Fuller CanadaStanley Aairah Negrette, MD 01/06/2016 8:30 AM  Do we have medical clearance?  Where is the note?  Please answer these 2 questions THEN  I can ANSWER THE QUESTION

## 2016-01-18 NOTE — Telephone Encounter (Signed)
ROUTING TO DR HARRISON TO ADVISE 

## 2016-01-19 ENCOUNTER — Telehealth: Payer: Self-pay | Admitting: Orthopedic Surgery

## 2016-01-19 NOTE — Telephone Encounter (Signed)
[name] DavenportSandra called first thing this morning as well as leaving a message on voicemail. She is asking when is her surgery? She said she had spoken with her heart doctor's office and they told her that information had been sent to our office on 7/27.  Please call and advise

## 2016-01-19 NOTE — Telephone Encounter (Signed)
Called patient, no answer, left detailed vm advising that I have reached out to both offices this morning, PCP and cardiology requesting LETTERS of surgical clearance, once these are obtained we may proceed with scheduling, advised I would update her this evening.

## 2016-01-19 NOTE — Telephone Encounter (Signed)
Patient returned call - I relayed per detailed message as nurse was present at time of call.  Voiced understanding.

## 2016-01-19 NOTE — Telephone Encounter (Signed)
I see there was an office visit from 01/11/16 for surgical clearance, however, Dr Romeo AppleHarrison is requesting a surgical clearance note in patient's chart. Would it be possible to obtain this so we may move forward with surgery? Thank you

## 2016-01-20 ENCOUNTER — Encounter: Payer: Self-pay | Admitting: *Deleted

## 2016-01-20 NOTE — Telephone Encounter (Signed)
Have cardiology clearance letter, still awaiting primary care clearance letter

## 2016-01-20 NOTE — Telephone Encounter (Signed)
Please advise 

## 2016-01-24 ENCOUNTER — Telehealth: Payer: Self-pay

## 2016-01-24 ENCOUNTER — Telehealth: Payer: Self-pay | Admitting: Family Medicine

## 2016-01-24 ENCOUNTER — Other Ambulatory Visit: Payer: Self-pay | Admitting: *Deleted

## 2016-01-24 DIAGNOSIS — S82891A Other fracture of right lower leg, initial encounter for closed fracture: Secondary | ICD-10-CM

## 2016-01-24 MED ORDER — OXYCODONE-ACETAMINOPHEN 5-325 MG PO TABS: 1.0000 | ORAL_TABLET | Freq: Four times a day (QID) | ORAL | 0 refills | Status: DC | PRN

## 2016-01-24 NOTE — Telephone Encounter (Signed)
Pt notified letter of medical clearance was faxed to Dr Romeo AppleHarrison

## 2016-01-24 NOTE — Telephone Encounter (Signed)
Would like you to call her about surgery

## 2016-01-24 NOTE — Telephone Encounter (Signed)
NA - no VM  

## 2016-01-24 NOTE — Telephone Encounter (Signed)
Do you know anything about this? 

## 2016-01-25 ENCOUNTER — Encounter: Payer: Medicaid Other | Admitting: Family Medicine

## 2016-01-25 ENCOUNTER — Other Ambulatory Visit: Payer: Self-pay | Admitting: *Deleted

## 2016-01-25 NOTE — Telephone Encounter (Signed)
Dr Romeo AppleHarrison request surgery scheduled for 8/23, patient aware

## 2016-01-28 NOTE — Patient Instructions (Addendum)
[name]  01/28/2016     @PREFPERIOPPHARMACY @   Your procedure is scheduled on  02/02/2016   Report to [facility] at  940  A.M.  Call this number if you have problems the morning of surgery:  [phone]   Remember:  Do not eat food or drink liquids after midnight.  Take these medicines the morning of surgery with A SIP OF WATER  Hygroton, klonopin, flexaril, levothyroxine, metoprolol, prilosec,oxycodone, maxzide,effexor.   Do not wear jewelry, make-up or nail polish.  Do not wear lotions, powders, or perfumes.  You may wear deoderant.  Do not shave 48 hours prior to surgery.  Men may shave face and neck.  Do not bring valuables to the hospital.  [facility] is not responsible for any belongings or valuables.  Contacts, dentures or bridgework may not be worn into surgery.  Leave your suitcase in the car.  After surgery it may be brought to your room.  For patients admitted to the hospital, discharge time will be determined by your treatment team.  Patients discharged the day of surgery will not be allowed to drive home.   Name and phone number of your driver:   family Special instructions:  none  Please read over the following fact sheets that you were given. Anesthesia Post-op Instructions and Care and Recovery After Surgery       Displaced Trimalleolar Ankle Fracture Treated With Open Reduction In a displaced trimalleolar fracture, there are three breaks (fractures) in the bones that make up your ankle. These fractures are in the bone that you feel as the bump on the outside of your ankle (fibula) and the bone that you feel as the bump on the inside of your ankle (tibia).  Displaced means that the bones are not lined up correctly. The bones will be put back into position with a procedure called open reduction and internal fixation (ORIF). LET [facility] CARE PROVIDER KNOW ABOUT:  Any allergies you have.  All medicines you are taking, including  vitamins, herbs, eye drops, creams, and over-the-counter medicines.  Previous problems you or members of your family have had with the use of anesthetics.  Any blood disorders you have.  Previous surgeries you have had.  Medical conditions you have.  RISKS AND COMPLICATIONS Generally, this is a safe procedure. However, problems can occur and include:  Excessive bleeding.  Infection.  Posttraumatic arthritis.  Failure to heal properly, resulting in an unstable or arthritic ankle.  Stiffness of the ankle after the repair. BEFORE THE PROCEDURE  Ask your health care provider about:  Changing or stopping your regular medicines. This is especially important if you are taking diabetes medicines or blood thinners.  Taking medicines such as aspirin and ibuprofen. These medicines can thin your blood. Do not take these medicines before your procedure if your health care provider asks you not to.  Do not eat or drink anything after midnight on the night before the procedure or as directed by your health care provider. PROCEDURE   An IV tube will be inserted into a vein in your arm. Medicine will flow directly into your body through this tube. You may be given antibiotic medicine and medicine for pain through the IV.  You will be given one of the following:  A medicine that makes you fall asleep (general anesthetic).  A medicine injected into your spine that numbs your body below the waist (spinal anesthetic).  The skin  over your ankle will be cleaned with a germ-killing (antiseptic) solution.  The surgeon will make an incision through your skin to expose the areas of the fracture.  The broken bones will be put back into their normal positions. The surgeon will use a combination of screws, screws and a metal plate, or different types of wiring to hold the bones in place.  After the bones are back in place, the surgeon will close the incision using stitches or staples.  Then a  bandage (dressing) and a cast or supportive boot will be placed over your ankle. AFTER THE PROCEDURE  You will stay in a recovery area until the anesthetic wears off. Your blood pressure and pulse will be checked often.  It is normal to have some pain. You will be given medicine to control the pain.  While in the hospital, you will be helped out of bed so you can begin moving around. It is important for you to start moving around several times a day.   This information is not intended to replace advice given to you by your health care provider. Make sure you discuss any questions you have with your health care provider.   Document Released: 02/18/2002 Document Revised: 06/19/2014 Document Reviewed: 07/17/2013 Elsevier Interactive Patient Education 2016 [employer] Displaced Trimalleolar Ankle Fracture Treated With Open Reduction, Care After Refer to this sheet in the next few weeks. These instructions provide you with information on caring for yourself after your procedure. Your health care provider may also give you more specific instructions. Your treatment has been planned according to current medical practices, but problems sometimes occur. Call your health care provider if you have any problems or questions after your procedure. WHAT TO EXPECT AFTER THE PROCEDURE After your procedure, it is typical to have the following:   Pain.  Swelling. HOME CARE INSTRUCTIONS   Resume your normal diet and activities as directed by your health care provider.   Take medicines only as directed by your health care provider.   There are many different ways to close and cover an incision, including stitches, skin glue, and adhesive strips. Follow your health care provider's instructions on:  Incision care.  Bandage (dressing) changes and removal.  Incision closure removal.  If you have a plaster or fiberglass cast:   Keep your cast dry and clean.  Do not put pressure on any part of your  cast until it has completely hardened.  Do not use sharp or pointed objects to try to scratch the skin under the cast.  Check the skin around the cast every day. You may put lotion on any red or sore areas.  Protect your cast with a plastic bag while bathing. Do not lower the cast into water.   Use crutches as directed. Do not bear weight on your leg until your health care provider says you can.  Keep all follow-up visits as directed by your health care provider.  SEEK MEDICAL CARE IF:  You have a fever.  Your pain medicine is not helping.  You have numbness or tingling.  You have blood or drainage that seeps through your cast. SEEK IMMEDIATE MEDICAL CARE IF:   You have drainage, redness, swelling, or pain at your incision site.  You notice a bad smell coming from the incision area or dressings.  The edges of your incision come apart after the stitches or staples have been taken out.  Your foot gets cold, pale, or blue.  You have chest pain  or difficulty breathing.    This information is not intended to replace advice given to you by your health care provider. Make sure you discuss any questions you have with your health care provider.   Document Released: 12/16/2004 Document Revised: 06/19/2014 Document Reviewed: 07/17/2013 Elsevier Interactive Patient Education 2016 [employer]   PATIENT INSTRUCTIONS POST-ANESTHESIA  IMMEDIATELY FOLLOWING SURGERY:  Do not drive or operate machinery for the first twenty four hours after surgery.  Do not make any important decisions for twenty four hours after surgery or while taking narcotic pain medications or sedatives.  If you develop intractable nausea and vomiting or a severe headache please notify your doctor immediately.  FOLLOW-UP:  Please make an appointment with your surgeon as instructed. You do not need to follow up with anesthesia unless specifically instructed to do so.  WOUND CARE INSTRUCTIONS (if applicable):  Keep  a dry clean dressing on the anesthesia/puncture wound site if there is drainage.  Once the wound has quit draining you may leave it open to air.  Generally you should leave the bandage intact for twenty four hours unless there is drainage.  If the epidural site drains for more than 36-48 hours please call the anesthesia department.  QUESTIONS?:  Please feel free to call your physician or the hospital operator if you have any questions, and they will be happy to assist you.

## 2016-01-31 ENCOUNTER — Encounter (HOSPITAL_COMMUNITY)
Admission: RE | Admit: 2016-01-31 | Discharge: 2016-01-31 | Disposition: A | Discharge status: Home / Self Care | Payer: Medicaid Other | Source: Ambulatory Visit | Attending: Orthopedic Surgery | Admitting: Orthopedic Surgery

## 2016-01-31 ENCOUNTER — Encounter (HOSPITAL_COMMUNITY): Payer: Self-pay

## 2016-01-31 DIAGNOSIS — Z01812 Encounter for preprocedural laboratory examination: Secondary | ICD-10-CM | POA: Diagnosis not present

## 2016-01-31 HISTORY — DX: Essential (primary) hypertension: I10

## 2016-01-31 HISTORY — DX: Gastro-esophageal reflux disease without esophagitis: K21.9

## 2016-01-31 LAB — BASIC METABOLIC PANEL
Anion gap: 8 (ref 5–15)
BUN: 23 mg/dL — AB (ref 6–20)
CALCIUM: 9.4 mg/dL (ref 8.9–10.3)
CO2: 25 mmol/L (ref 22–32)
CREATININE: 1.09 mg/dL — AB (ref 0.44–1.00)
Chloride: 101 mmol/L (ref 101–111)
GFR calc non Af Amer: 52 mL/min — ABNORMAL LOW (ref >60)
Glucose, Bld: 104 mg/dL — ABNORMAL HIGH (ref 65–99)
Potassium: 3.2 mmol/L — ABNORMAL LOW (ref 3.5–5.1)
SODIUM: 134 mmol/L — AB (ref 135–145)

## 2016-01-31 LAB — CBC WITH DIFFERENTIAL/PLATELET
BASOS PCT: 0 %
Basophils Absolute: 0 10*3/uL (ref 0.0–0.1)
EOS ABS: 0.1 10*3/uL (ref 0.0–0.7)
EOS PCT: 2 %
HCT: 40.8 % (ref 36.0–46.0)
Hemoglobin: 13.8 g/dL (ref 12.0–15.0)
Lymphocytes Relative: 35 %
Lymphs Abs: 2.5 10*3/uL (ref 0.7–4.0)
MCH: 30.2 pg (ref 26.0–34.0)
MCHC: 33.8 g/dL (ref 30.0–36.0)
MCV: 89.3 fL (ref 78.0–100.0)
MONO ABS: 0.6 10*3/uL (ref 0.1–1.0)
MONOS PCT: 8 %
NEUTROS PCT: 55 %
Neutro Abs: 3.9 10*3/uL (ref 1.7–7.7)
PLATELETS: 252 10*3/uL (ref 150–400)
RBC: 4.57 MIL/uL (ref 3.87–5.11)
RDW: 12.5 % (ref 11.5–15.5)
WBC: 7.2 10*3/uL (ref 4.0–10.5)

## 2016-01-31 LAB — SURGICAL PCR SCREEN
MRSA, PCR: NEGATIVE
STAPHYLOCOCCUS AUREUS: POSITIVE — AB

## 2016-02-01 ENCOUNTER — Encounter (HOSPITAL_COMMUNITY): Payer: Self-pay | Admitting: Anesthesiology

## 2016-02-01 ENCOUNTER — Other Ambulatory Visit: Payer: Self-pay | Admitting: Family Medicine

## 2016-02-01 NOTE — Anesthesia Preprocedure Evaluation (Addendum)
Anesthesia Evaluation  Patient identified by MRN, date of birth, ID band Patient awake    Reviewed: Allergy & Precautions, NPO status , Patient's Chart, lab work & pertinent test results, reviewed documented beta blocker date and time   Airway Mallampati: III  TM Distance: >3 FB Neck ROM: Full    Dental no notable dental hx. (+) Teeth Intact   Pulmonary shortness of breath and with exertion,    Pulmonary exam normal breath sounds clear to auscultation       Cardiovascular hypertension, Pt. on medications and Pt. on home beta blockers Normal cardiovascular exam+ dysrhythmias Supra Ventricular Tachycardia  Rhythm:Regular Rate:Normal     Neuro/Psych PSYCHIATRIC DISORDERS Anxiety Depression  Neuromuscular disease    GI/Hepatic Neg liver ROS, hiatal hernia, GERD  Medicated and Controlled,  Endo/Other  Hypothyroidism Obesity Hyperlipidemia  Renal/GU negative Renal ROS  negative genitourinary   Musculoskeletal  (+) Arthritis , Trimalleolar Fx Right ankle   Abdominal (+) + obese,   Peds  Hematology  (+) anemia ,   Anesthesia Other Findings   Reproductive/Obstetrics                            Anesthesia Physical Anesthesia Plan  ASA: II  Anesthesia Plan: General   Post-op Pain Management:    Induction: Intravenous  Airway Management Planned: Oral ETT and LMA  Additional Equipment:   Intra-op Plan:   Post-operative Plan: Extubation in OR  Informed Consent: I have reviewed the patients History and Physical, chart, labs and discussed the procedure including the risks, benefits and alternatives for the proposed anesthesia with the patient or authorized representative who has indicated his/her understanding and acceptance.   Dental advisory given  Plan Discussed with: CRNA, Anesthesiologist and Surgeon  Anesthesia Plan Comments:         Anesthesia Quick Evaluation

## 2016-02-02 ENCOUNTER — Ambulatory Visit (HOSPITAL_COMMUNITY): Payer: Medicaid Other | Admitting: Anesthesiology

## 2016-02-02 ENCOUNTER — Encounter (HOSPITAL_COMMUNITY)
Admission: RE | Disposition: A | Discharge status: Home / Self Care | Payer: Self-pay | Source: Ambulatory Visit | Attending: Orthopedic Surgery

## 2016-02-02 ENCOUNTER — Observation Stay (HOSPITAL_COMMUNITY)
Admission: RE | Admit: 2016-02-02 | Discharge: 2016-02-03 | Disposition: A | Discharge status: Home / Self Care | Payer: Medicaid Other | Source: Ambulatory Visit | Attending: Orthopedic Surgery | Admitting: Orthopedic Surgery

## 2016-02-02 ENCOUNTER — Ambulatory Visit (HOSPITAL_COMMUNITY): Payer: Medicaid Other

## 2016-02-02 ENCOUNTER — Telehealth: Payer: Self-pay | Admitting: Orthopedic Surgery

## 2016-02-02 ENCOUNTER — Encounter (HOSPITAL_COMMUNITY): Payer: Self-pay | Admitting: *Deleted

## 2016-02-02 DIAGNOSIS — K219 Gastro-esophageal reflux disease without esophagitis: Secondary | ICD-10-CM | POA: Insufficient documentation

## 2016-02-02 DIAGNOSIS — R030 Elevated blood-pressure reading, without diagnosis of hypertension: Secondary | ICD-10-CM | POA: Insufficient documentation

## 2016-02-02 DIAGNOSIS — Z7982 Long term (current) use of aspirin: Secondary | ICD-10-CM | POA: Diagnosis not present

## 2016-02-02 DIAGNOSIS — Z6836 Body mass index (BMI) 36.0-36.9, adult: Secondary | ICD-10-CM | POA: Insufficient documentation

## 2016-02-02 DIAGNOSIS — T148XXA Other injury of unspecified body region, initial encounter: Secondary | ICD-10-CM

## 2016-02-02 DIAGNOSIS — I471 Supraventricular tachycardia: Secondary | ICD-10-CM | POA: Diagnosis not present

## 2016-02-02 DIAGNOSIS — K449 Diaphragmatic hernia without obstruction or gangrene: Secondary | ICD-10-CM | POA: Insufficient documentation

## 2016-02-02 DIAGNOSIS — Z96651 Presence of right artificial knee joint: Secondary | ICD-10-CM | POA: Insufficient documentation

## 2016-02-02 DIAGNOSIS — W19XXXA Unspecified fall, initial encounter: Secondary | ICD-10-CM | POA: Diagnosis not present

## 2016-02-02 DIAGNOSIS — M5126 Other intervertebral disc displacement, lumbar region: Secondary | ICD-10-CM | POA: Diagnosis not present

## 2016-02-02 DIAGNOSIS — Z86718 Personal history of other venous thrombosis and embolism: Secondary | ICD-10-CM | POA: Insufficient documentation

## 2016-02-02 DIAGNOSIS — M1990 Unspecified osteoarthritis, unspecified site: Secondary | ICD-10-CM | POA: Diagnosis not present

## 2016-02-02 DIAGNOSIS — Z79899 Other long term (current) drug therapy: Secondary | ICD-10-CM | POA: Diagnosis not present

## 2016-02-02 DIAGNOSIS — S82851A Displaced trimalleolar fracture of right lower leg, initial encounter for closed fracture: Principal | ICD-10-CM | POA: Insufficient documentation

## 2016-02-02 DIAGNOSIS — G8929 Other chronic pain: Secondary | ICD-10-CM | POA: Insufficient documentation

## 2016-02-02 DIAGNOSIS — E039 Hypothyroidism, unspecified: Secondary | ICD-10-CM | POA: Insufficient documentation

## 2016-02-02 DIAGNOSIS — Z8249 Family history of ischemic heart disease and other diseases of the circulatory system: Secondary | ICD-10-CM | POA: Insufficient documentation

## 2016-02-02 DIAGNOSIS — M25519 Pain in unspecified shoulder: Secondary | ICD-10-CM | POA: Insufficient documentation

## 2016-02-02 DIAGNOSIS — Z90710 Acquired absence of both cervix and uterus: Secondary | ICD-10-CM | POA: Diagnosis not present

## 2016-02-02 DIAGNOSIS — M545 Low back pain: Secondary | ICD-10-CM | POA: Insufficient documentation

## 2016-02-02 DIAGNOSIS — E785 Hyperlipidemia, unspecified: Secondary | ICD-10-CM | POA: Insufficient documentation

## 2016-02-02 DIAGNOSIS — S82851P Displaced trimalleolar fracture of right lower leg, subsequent encounter for closed fracture with malunion: Secondary | ICD-10-CM | POA: Diagnosis not present

## 2016-02-02 DIAGNOSIS — D509 Iron deficiency anemia, unspecified: Secondary | ICD-10-CM | POA: Diagnosis not present

## 2016-02-02 DIAGNOSIS — Z803 Family history of malignant neoplasm of breast: Secondary | ICD-10-CM | POA: Diagnosis not present

## 2016-02-02 DIAGNOSIS — F329 Major depressive disorder, single episode, unspecified: Secondary | ICD-10-CM | POA: Diagnosis not present

## 2016-02-02 DIAGNOSIS — Y939 Activity, unspecified: Secondary | ICD-10-CM | POA: Diagnosis not present

## 2016-02-02 DIAGNOSIS — E669 Obesity, unspecified: Secondary | ICD-10-CM | POA: Insufficient documentation

## 2016-02-02 DIAGNOSIS — F419 Anxiety disorder, unspecified: Secondary | ICD-10-CM | POA: Insufficient documentation

## 2016-02-02 HISTORY — PX: ORIF ANKLE FRACTURE: SHX5408

## 2016-02-02 LAB — CREATININE, SERUM
Creatinine, Ser: 1.12 mg/dL — ABNORMAL HIGH (ref 0.44–1.00)
GFR calc Af Amer: 59 mL/min — ABNORMAL LOW (ref >60)
GFR calc non Af Amer: 51 mL/min — ABNORMAL LOW (ref >60)

## 2016-02-02 SURGERY — OPEN REDUCTION INTERNAL FIXATION (ORIF) ANKLE FRACTURE
Anesthesia: General | Site: Ankle | Laterality: Right

## 2016-02-02 MED ORDER — NITROFURANTOIN MACROCRYSTAL 100 MG PO CAPS
100.0000 mg | ORAL_CAPSULE | Freq: Every day | ORAL | Status: DC
  Administered 2016-02-02: 100 mg via ORAL
  Filled 2016-02-02 (×2): qty 1

## 2016-02-02 MED ORDER — HYDROMORPHONE HCL 1 MG/ML IJ SOLN
0.2500 mg | INTRAMUSCULAR | Status: DC | PRN
  Administered 2016-02-02: 0.25 mg via INTRAVENOUS

## 2016-02-02 MED ORDER — ALBUTEROL SULFATE (2.5 MG/3ML) 0.083% IN NEBU
2.5000 mg | INHALATION_SOLUTION | Freq: Four times a day (QID) | RESPIRATORY_TRACT | Status: DC
  Administered 2016-02-02 – 2016-02-03 (×2): 2.5 mg via RESPIRATORY_TRACT
  Filled 2016-02-02 (×2): qty 3

## 2016-02-02 MED ORDER — OMEGA-3-ACID ETHYL ESTERS 1 G PO CAPS
1.0000 g | ORAL_CAPSULE | Freq: Two times a day (BID) | ORAL | Status: DC
  Administered 2016-02-02 – 2016-02-03 (×3): 1 g via ORAL
  Filled 2016-02-02 (×3): qty 1

## 2016-02-02 MED ORDER — ROCURONIUM BROMIDE 50 MG/5ML IV SOLN
INTRAVENOUS | Status: AC
  Filled 2016-02-02: qty 1

## 2016-02-02 MED ORDER — VENLAFAXINE HCL ER 75 MG PO CP24
75.0000 mg | ORAL_CAPSULE | Freq: Every day | ORAL | Status: DC
  Administered 2016-02-03: 75 mg via ORAL
  Filled 2016-02-02: qty 1

## 2016-02-02 MED ORDER — TRIAMTERENE-HCTZ 37.5-25 MG PO TABS
1.0000 | ORAL_TABLET | Freq: Every day | ORAL | Status: DC
  Administered 2016-02-02 – 2016-02-03 (×2): 1 via ORAL
  Filled 2016-02-02 (×2): qty 1

## 2016-02-02 MED ORDER — HYDROCODONE-ACETAMINOPHEN 7.5-325 MG PO TABS
1.0000 | ORAL_TABLET | Freq: Once | ORAL | Status: DC | PRN
Start: 2016-02-02 — End: 2016-02-02

## 2016-02-02 MED ORDER — CEFAZOLIN SODIUM-DEXTROSE 2-4 GM/100ML-% IV SOLN
2.0000 g | Freq: Four times a day (QID) | INTRAVENOUS | Status: AC
  Administered 2016-02-02 (×2): 2 g via INTRAVENOUS
  Filled 2016-02-02 (×2): qty 100

## 2016-02-02 MED ORDER — METOCLOPRAMIDE HCL 10 MG PO TABS: 5.0000 mg | ORAL_TABLET | Freq: Three times a day (TID) | ORAL | Status: DC | PRN

## 2016-02-02 MED ORDER — ESTROGENS, CONJUGATED 0.625 MG/GM VA CREA
1.0000 | TOPICAL_CREAM | Freq: Every day | VAGINAL | Status: DC
  Administered 2016-02-03: 1 via VAGINAL
  Filled 2016-02-02: qty 30

## 2016-02-02 MED ORDER — METOPROLOL SUCCINATE ER 50 MG PO TB24
50.0000 mg | ORAL_TABLET | Freq: Two times a day (BID) | ORAL | Status: DC
  Administered 2016-02-02 – 2016-02-03 (×2): 50 mg via ORAL
  Filled 2016-02-02 (×2): qty 1

## 2016-02-02 MED ORDER — CHLORHEXIDINE GLUCONATE 4 % EX LIQD: 60.0000 mL | Freq: Once | CUTANEOUS | Status: DC

## 2016-02-02 MED ORDER — 0.9 % SODIUM CHLORIDE (POUR BTL) OPTIME
TOPICAL | Status: DC | PRN
  Administered 2016-02-02: 1000 mL

## 2016-02-02 MED ORDER — PANTOPRAZOLE SODIUM 40 MG PO TBEC
40.0000 mg | DELAYED_RELEASE_TABLET | Freq: Every day | ORAL | Status: DC
  Administered 2016-02-02 – 2016-02-03 (×2): 40 mg via ORAL
  Filled 2016-02-02 (×2): qty 1

## 2016-02-02 MED ORDER — MORPHINE SULFATE (PF) 2 MG/ML IV SOLN
0.5000 mg | INTRAVENOUS | Status: DC | PRN
  Administered 2016-02-02 – 2016-02-03 (×4): 0.5 mg via INTRAVENOUS
  Filled 2016-02-02 (×4): qty 1

## 2016-02-02 MED ORDER — LIDOCAINE HCL (PF) 1 % IJ SOLN
INTRAMUSCULAR | Status: AC
  Filled 2016-02-02: qty 5

## 2016-02-02 MED ORDER — OSPEMIFENE 60 MG PO TABS: 1.0000 | ORAL_TABLET | Freq: Every day | ORAL | Status: DC

## 2016-02-02 MED ORDER — PHENOL 1.4 % MT LIQD: 1.0000 | OROMUCOSAL | Status: DC | PRN

## 2016-02-02 MED ORDER — LUBIPROSTONE 24 MCG PO CAPS
24.0000 ug | ORAL_CAPSULE | Freq: Two times a day (BID) | ORAL | Status: DC
  Administered 2016-02-02 – 2016-02-03 (×3): 24 ug via ORAL
  Filled 2016-02-02 (×3): qty 1

## 2016-02-02 MED ORDER — OXYCODONE-ACETAMINOPHEN 5-325 MG PO TABS
1.0000 | ORAL_TABLET | Freq: Four times a day (QID) | ORAL | Status: DC | PRN
  Administered 2016-02-03: 1 via ORAL

## 2016-02-02 MED ORDER — GLYCOPYRROLATE 0.2 MG/ML IJ SOLN
INTRAMUSCULAR | Status: DC | PRN
  Administered 2016-02-02: 0.6 mg via INTRAVENOUS

## 2016-02-02 MED ORDER — EZETIMIBE 10 MG PO TABS
10.0000 mg | ORAL_TABLET | Freq: Every day | ORAL | Status: DC
  Administered 2016-02-02: 10 mg via ORAL
  Filled 2016-02-02: qty 1

## 2016-02-02 MED ORDER — MIDAZOLAM HCL 2 MG/2ML IJ SOLN
INTRAMUSCULAR | Status: AC
  Filled 2016-02-02: qty 2

## 2016-02-02 MED ORDER — CEFAZOLIN SODIUM-DEXTROSE 2-4 GM/100ML-% IV SOLN
2.0000 g | INTRAVENOUS | Status: AC
  Administered 2016-02-02: 2 g via INTRAVENOUS
  Filled 2016-02-02: qty 100

## 2016-02-02 MED ORDER — CHLORTHALIDONE 25 MG PO TABS
25.0000 mg | ORAL_TABLET | Freq: Two times a day (BID) | ORAL | Status: DC
  Administered 2016-02-02 – 2016-02-03 (×2): 25 mg via ORAL
  Filled 2016-02-02 (×5): qty 1

## 2016-02-02 MED ORDER — MIDAZOLAM HCL 5 MG/5ML IJ SOLN
INTRAMUSCULAR | Status: DC | PRN
  Administered 2016-02-02: 2 mg via INTRAVENOUS

## 2016-02-02 MED ORDER — POTASSIUM CHLORIDE IN NACL 20-0.9 MEQ/L-% IV SOLN
INTRAVENOUS | Status: DC
  Administered 2016-02-02 – 2016-02-03 (×2): via INTRAVENOUS

## 2016-02-02 MED ORDER — SIMVASTATIN 20 MG PO TABS
80.0000 mg | ORAL_TABLET | Freq: Every day | ORAL | Status: DC
  Administered 2016-02-02: 80 mg via ORAL
  Filled 2016-02-02: qty 4

## 2016-02-02 MED ORDER — ONDANSETRON HCL 4 MG/2ML IJ SOLN: 4.0000 mg | Freq: Four times a day (QID) | INTRAMUSCULAR | Status: DC | PRN

## 2016-02-02 MED ORDER — FENTANYL CITRATE (PF) 100 MCG/2ML IJ SOLN
INTRAMUSCULAR | Status: DC | PRN
  Administered 2016-02-02 (×5): 50 ug via INTRAVENOUS
  Administered 2016-02-02: 100 ug via INTRAVENOUS
  Administered 2016-02-02 (×3): 50 ug via INTRAVENOUS

## 2016-02-02 MED ORDER — VITAMIN D 1000 UNITS PO TABS
ORAL_TABLET | Freq: Every morning | ORAL | Status: DC
  Administered 2016-02-02: 18:00:00 via ORAL
  Administered 2016-02-03: 1000 [IU] via ORAL
  Filled 2016-02-02 (×2): qty 1

## 2016-02-02 MED ORDER — POTASSIUM CHLORIDE CRYS ER 20 MEQ PO TBCR
40.0000 meq | EXTENDED_RELEASE_TABLET | Freq: Two times a day (BID) | ORAL | Status: DC
  Administered 2016-02-02 – 2016-02-03 (×3): 40 meq via ORAL
  Filled 2016-02-02 (×3): qty 2

## 2016-02-02 MED ORDER — FUROSEMIDE 20 MG PO TABS
20.0000 mg | ORAL_TABLET | Freq: Every day | ORAL | Status: DC
  Administered 2016-02-02 – 2016-02-03 (×2): 20 mg via ORAL
  Filled 2016-02-02 (×2): qty 1

## 2016-02-02 MED ORDER — BUPIVACAINE-EPINEPHRINE (PF) 0.5% -1:200000 IJ SOLN
INTRAMUSCULAR | Status: DC | PRN
  Administered 2016-02-02: 60 mL

## 2016-02-02 MED ORDER — LEVOTHYROXINE SODIUM 88 MCG PO TABS
88.0000 ug | ORAL_TABLET | Freq: Every day | ORAL | Status: DC
  Administered 2016-02-03: 88 ug via ORAL
  Filled 2016-02-02: qty 1

## 2016-02-02 MED ORDER — LACTATED RINGERS IV SOLN
INTRAVENOUS | Status: DC
  Administered 2016-02-02: 500 mL via INTRAVENOUS
  Administered 2016-02-02: 10:00:00 via INTRAVENOUS

## 2016-02-02 MED ORDER — NEOSTIGMINE METHYLSULFATE 10 MG/10ML IV SOLN
INTRAVENOUS | Status: DC | PRN
  Administered 2016-02-02 (×2): 1 mg via INTRAVENOUS

## 2016-02-02 MED ORDER — PROPOFOL 10 MG/ML IV BOLUS
INTRAVENOUS | Status: AC
  Filled 2016-02-02: qty 20

## 2016-02-02 MED ORDER — PROPOFOL 10 MG/ML IV BOLUS
INTRAVENOUS | Status: DC | PRN
  Administered 2016-02-02: 160 mg via INTRAVENOUS

## 2016-02-02 MED ORDER — ACETAMINOPHEN 325 MG PO TABS
650.0000 mg | ORAL_TABLET | Freq: Four times a day (QID) | ORAL | Status: DC | PRN
Start: 2016-02-02 — End: 2016-02-03

## 2016-02-02 MED ORDER — FENTANYL CITRATE (PF) 250 MCG/5ML IJ SOLN
INTRAMUSCULAR | Status: AC
  Filled 2016-02-02: qty 5

## 2016-02-02 MED ORDER — ONDANSETRON HCL 4 MG/2ML IJ SOLN
INTRAMUSCULAR | Status: AC
  Filled 2016-02-02: qty 2

## 2016-02-02 MED ORDER — ASPIRIN EC 325 MG PO TBEC
325.0000 mg | DELAYED_RELEASE_TABLET | Freq: Every day | ORAL | Status: DC
  Administered 2016-02-02 – 2016-02-03 (×2): 325 mg via ORAL
  Filled 2016-02-02 (×2): qty 1

## 2016-02-02 MED ORDER — ROCURONIUM BROMIDE 100 MG/10ML IV SOLN
INTRAVENOUS | Status: DC | PRN
  Administered 2016-02-02: 35 mg via INTRAVENOUS
  Administered 2016-02-02: 10 mg via INTRAVENOUS

## 2016-02-02 MED ORDER — DOCUSATE SODIUM 100 MG PO CAPS
100.0000 mg | ORAL_CAPSULE | Freq: Two times a day (BID) | ORAL | Status: DC
  Administered 2016-02-02 – 2016-02-03 (×3): 100 mg via ORAL
  Filled 2016-02-02 (×3): qty 1

## 2016-02-02 MED ORDER — METOCLOPRAMIDE HCL 5 MG/ML IJ SOLN: 5.0000 mg | Freq: Three times a day (TID) | INTRAMUSCULAR | Status: DC | PRN

## 2016-02-02 MED ORDER — MEPERIDINE HCL 50 MG/ML IJ SOLN: 6.2500 mg | INTRAMUSCULAR | Status: DC | PRN

## 2016-02-02 MED ORDER — ONDANSETRON HCL 4 MG PO TABS: 4.0000 mg | ORAL_TABLET | Freq: Four times a day (QID) | ORAL | Status: DC | PRN

## 2016-02-02 MED ORDER — EZETIMIBE-SIMVASTATIN 10-80 MG PO TABS: 1.0000 | ORAL_TABLET | Freq: Every day | ORAL | Status: DC

## 2016-02-02 MED ORDER — CLONAZEPAM 0.5 MG PO TABS
2.0000 mg | ORAL_TABLET | Freq: Two times a day (BID) | ORAL | Status: DC
  Administered 2016-02-02 – 2016-02-03 (×3): 2 mg via ORAL
  Filled 2016-02-02 (×3): qty 4

## 2016-02-02 MED ORDER — MENTHOL 3 MG MT LOZG: 1.0000 | LOZENGE | OROMUCOSAL | Status: DC | PRN

## 2016-02-02 MED ORDER — METOCLOPRAMIDE HCL 5 MG/ML IJ SOLN: 10.0000 mg | Freq: Once | INTRAMUSCULAR | Status: DC | PRN

## 2016-02-02 MED ORDER — BUPIVACAINE-EPINEPHRINE (PF) 0.5% -1:200000 IJ SOLN
INTRAMUSCULAR | Status: AC
  Filled 2016-02-02: qty 60

## 2016-02-02 MED ORDER — HYDROMORPHONE HCL 1 MG/ML IJ SOLN
INTRAMUSCULAR | Status: AC
  Filled 2016-02-02: qty 1

## 2016-02-02 MED ORDER — OXYCODONE-ACETAMINOPHEN 5-325 MG PO TABS
1.0000 | ORAL_TABLET | ORAL | Status: DC
  Administered 2016-02-02 – 2016-02-03 (×4): 1 via ORAL
  Filled 2016-02-02 (×5): qty 1

## 2016-02-02 MED ORDER — LIDOCAINE HCL 1 % IJ SOLN
INTRAMUSCULAR | Status: DC | PRN
  Administered 2016-02-02: 20 mg via INTRADERMAL

## 2016-02-02 MED ORDER — CYCLOSPORINE 0.05 % OP EMUL
1.0000 [drp] | Freq: Two times a day (BID) | OPHTHALMIC | Status: DC
  Administered 2016-02-02 – 2016-02-03 (×3): 1 [drp] via OPHTHALMIC
  Filled 2016-02-02 (×5): qty 1

## 2016-02-02 MED ORDER — CYCLOBENZAPRINE HCL 10 MG PO TABS
10.0000 mg | ORAL_TABLET | Freq: Three times a day (TID) | ORAL | Status: DC | PRN
  Administered 2016-02-02: 10 mg via ORAL
  Filled 2016-02-02: qty 1

## 2016-02-02 MED ORDER — ENOXAPARIN SODIUM 30 MG/0.3ML ~~LOC~~ SOLN
30.0000 mg | SUBCUTANEOUS | Status: DC
  Administered 2016-02-03: 30 mg via SUBCUTANEOUS
  Filled 2016-02-02: qty 0.3

## 2016-02-02 MED ORDER — ONDANSETRON HCL 4 MG/2ML IJ SOLN
INTRAMUSCULAR | Status: DC | PRN
  Administered 2016-02-02: 4 mg via INTRAVENOUS

## 2016-02-02 MED ORDER — MORPHINE SULFATE (PF) 4 MG/ML IV SOLN
8.0000 mg | Freq: Once | INTRAVENOUS | Status: AC
  Administered 2016-02-02: 8 mg via INTRAVENOUS
  Filled 2016-02-02: qty 2

## 2016-02-02 MED ORDER — ACETAMINOPHEN 650 MG RE SUPP: 650.0000 mg | Freq: Four times a day (QID) | RECTAL | Status: DC | PRN

## 2016-02-02 SURGICAL SUPPLY — 69 items
BANDAGE ELASTIC 3 LF NS (GAUZE/BANDAGES/DRESSINGS) ×2 IMPLANT
BANDAGE ELASTIC 4 LF NS (GAUZE/BANDAGES/DRESSINGS) ×3 IMPLANT
BANDAGE ESMARK 4X12 BL STRL LF (DISPOSABLE) ×1 IMPLANT
BIT DRILL 3.5X122MM AO FIT (BIT) IMPLANT
BIT DRILL CANN 2.7 (BIT) ×2
BIT DRILL SRG 2.7XCANN AO CPLG (BIT) IMPLANT
BIT DRL SRG 2.7XCANN AO CPLNG (BIT) ×1
BLADE SURG SZ10 CARB STEEL (BLADE) ×2 IMPLANT
BNDG CMPR 12X4 ELC STRL LF (DISPOSABLE) ×1
BNDG CMPR MED 5X3 ELC HKLP NS (GAUZE/BANDAGES/DRESSINGS) ×1
BNDG CMPR MED 5X4 ELC HKLP NS (GAUZE/BANDAGES/DRESSINGS) ×1
BNDG COHESIVE 4X5 TAN STRL (GAUZE/BANDAGES/DRESSINGS) ×2 IMPLANT
BNDG ESMARK 4X12 BLUE STRL LF (DISPOSABLE) ×2
CHLORAPREP W/TINT 26ML (MISCELLANEOUS) ×4 IMPLANT
CLOTH BEACON ORANGE TIMEOUT ST (SAFETY) ×2 IMPLANT
COVER LIGHT HANDLE STERIS (MISCELLANEOUS) ×4 IMPLANT
CUFF TOURNIQUET SINGLE 34IN LL (TOURNIQUET CUFF) ×2 IMPLANT
CUFF TOURNIQUET SINGLE 44IN (TOURNIQUET CUFF) IMPLANT
DECANTER SPIKE VIAL GLASS SM (MISCELLANEOUS) ×4 IMPLANT
DRAPE C-ARM FOLDED MOBILE STRL (DRAPES) ×2 IMPLANT
DRAPE PROXIMA HALF (DRAPES) ×1 IMPLANT
DRILL 2.6X122MM WL AO SHAFT (BIT) ×1 IMPLANT
GAUZE SPONGE 4X4 12PLY STRL (GAUZE/BANDAGES/DRESSINGS) ×2 IMPLANT
GAUZE XEROFORM 5X9 LF (GAUZE/BANDAGES/DRESSINGS) ×2 IMPLANT
GLOVE BIOGEL PI IND STRL 7.0 (GLOVE) ×2 IMPLANT
GLOVE BIOGEL PI INDICATOR 7.0 (GLOVE) ×2
GLOVE SKINSENSE NS SZ8.0 LF (GLOVE) ×1
GLOVE SKINSENSE STRL SZ8.0 LF (GLOVE) ×1 IMPLANT
GLOVE SS N UNI LF 8.5 STRL (GLOVE) ×2 IMPLANT
GOWN STRL REUS W/TWL LRG LVL3 (GOWN DISPOSABLE) ×4 IMPLANT
GOWN STRL REUS W/TWL XL LVL3 (GOWN DISPOSABLE) ×2 IMPLANT
INST SET MINOR BONE (KITS) ×2 IMPLANT
K-WIRE 1.4X100 (WIRE)
K-WIRE 1.6X150 (WIRE)
K-WIRE FX150X1.6XKRSH (WIRE)
K-WIRE SMOOTH 1.4X150 (WIRE) ×4
K-WIRE SMOOTH 2.0X150 (WIRE)
KIT ROOM TURNOVER APOR (KITS) ×2 IMPLANT
KWIRE 1.4X100 (WIRE) IMPLANT
KWIRE FX150X1.6XKRSH (WIRE) IMPLANT
KWIRE SMOOTH 1.4X150 (WIRE) IMPLANT
KWIRE SMOOTH 2.0X150 (WIRE) IMPLANT
MANIFOLD NEPTUNE II (INSTRUMENTS) ×2 IMPLANT
NDL HYPO 21X1.5 SAFETY (NEEDLE) ×1 IMPLANT
NEEDLE HYPO 21X1.5 SAFETY (NEEDLE) ×2 IMPLANT
NS IRRIG 1000ML POUR BTL (IV SOLUTION) ×2 IMPLANT
PACK BASIC LIMB (CUSTOM PROCEDURE TRAY) ×2 IMPLANT
PAD ABD 5X9 TENDERSORB (GAUZE/BANDAGES/DRESSINGS) ×3 IMPLANT
PAD ARMBOARD 7.5X6 YLW CONV (MISCELLANEOUS) ×2 IMPLANT
PAD CAST 4YDX4 CTTN HI CHSV (CAST SUPPLIES) ×1 IMPLANT
PADDING CAST COTTON 4X4 STRL (CAST SUPPLIES) ×2
PLATE 6H 113MM (Plate) ×1 IMPLANT
SCREW 44X4.0MM (Screw) ×2 IMPLANT
SCREW BONE 18 (Screw) ×1 IMPLANT
SCREW BONE 3.5X20MM (Screw) ×3 IMPLANT
SCREW BONE NON-LCKING 3.5X12MM (Screw) ×4 IMPLANT
SET BASIN LINEN APH (SET/KITS/TRAYS/PACK) ×2 IMPLANT
SPLINT IMMOBILIZER J 3INX20FT (CAST SUPPLIES)
SPLINT J IMMOBILIZER 3X20FT (CAST SUPPLIES) IMPLANT
SPLINT J IMMOBILIZER 4X20FT (CAST SUPPLIES) IMPLANT
SPLINT J PLASTER J 4INX20Y (CAST SUPPLIES) ×1
SPONGE LAP 18X18 X RAY DECT (DISPOSABLE) ×2 IMPLANT
STAPLER VISISTAT 35W (STAPLE) ×2 IMPLANT
SUT ETHILON 3 0 FSL (SUTURE) IMPLANT
SUT MON AB 0 CT1 (SUTURE) ×2 IMPLANT
SUT MON AB 2-0 CT1 36 (SUTURE) ×2 IMPLANT
SYR 30ML LL (SYRINGE) ×2 IMPLANT
SYR BULB IRRIGATION 50ML (SYRINGE) ×2 IMPLANT
YANKAUER SUCT BULB TIP NO VENT (SUCTIONS) ×1 IMPLANT

## 2016-02-02 NOTE — H&P (Signed)
Chief Complaint  Patient presents with  . New Patient (Initial Visit)      ER FOLLOW UP RIGHT ANKLE FRACTURE    HPI 65 year old female with multiple medical problems as listed below comes in with history of fall injuring the right ankle July 20. She had closed reduction the emergency room Followed by splint with good reduction.    Initial complaints of right ankle pain dull throbbing severe 7 days duration constant  Preoperative medical consultation was obtained and patient was optimized for surgery   Review of Systems  Constitutional: Negative for chills, fever and malaise/fatigue.  Respiratory: Negative for shortness of breath.   Cardiovascular: Negative for chest pain.  Musculoskeletal: Positive for myalgias.  Skin: Negative for itching and rash.  Remaining review of systems were normal       Past Medical History:  Diagnosis Date  . Anxiety    . Arthritis    . Borderline hypertension    . Chronic lower back pain    . Chronic shoulder pain    . Depression    . Fe deficiency anemia    . Heartburn    . Hiatal hernia    . History of cardiac catheterization      Normal coronaries  . History of mixed drug abuse    . Hyperlipidemia    . Hypothyroidism      Dr. Annie MainBaler   . Lumbar herniated disc      L4-L5   . Microscopic hematuria    . Obesity    . Personal history of venous thrombosis and embolism    . PSVT (paroxysmal supraventricular tachycardia) (HCC) 8/05  . Recurrent cystitis      Dr. Aldean AstKimbrough            Past Surgical History:  Procedure Laterality Date  . APPENDECTOMY   2004?or 11/03?  [name]. BACK SURGERY      . BLADDER REPAIR      . CARPAL TUNNEL RELEASE        x2   . LIVER BIOPSY   2002    Dr. Katrinka BlazingSmith   . PARTIAL HYSTERECTOMY   2004    One ovary   . RECTAL PROLAPSE REPAIR      . RECTOCELE REPAIR   05/2001  . Right knee arthroscopy   8/05  . Right knee replacement   8/05  . SHOULDER SURGERY        Family History  Problem Relation Age of Onset  . Heart  attack          Family history   . Coronary artery disease          Family History   . Breast cancer Mother    . Breast cancer Sister          Social History  Substance Use Topics  . Smoking status: Never Smoker  . Smokeless tobacco: Never Used  . Alcohol use No      Current Outpatient Prescriptions:  .  AMITIZA 24 MCG capsule, TAKE ONE CAPSULE BY MOUTH TWICE A DAY, Disp: 60 capsule, Rfl: 5 .  aspirin EC 81 MG tablet, Take by mouth. Take 81 mg by mouth daily., Disp: , Rfl:  .  atorvastatin (LIPITOR) 80 MG tablet, TAKE ONE (1) TABLET EACH DAY, Disp: 90 tablet, Rfl: 0 .  Calcium Carbonate-Vitamin D (CALCIUM 500 + D) 500-125 MG-UNIT TABS, Take by mouth. Take 1 tablet by mouth every morning., Disp: , Rfl:  .  chlorthalidone (HYGROTON) 25 MG  tablet, TAKE ONE TABLET BY MOUTH TWICE DAILY, Disp: 60 tablet, Rfl: 0 .  Cholecalciferol (VITAMIN D-3 PO), Take 1 capsule by mouth every morning. , Disp: , Rfl:  .  clonazePAM (KLONOPIN) 2 MG tablet, TAKE 1 TABLET TWICE DAILY AS NEEDED FOR ANXIETY, Disp: 60 tablet, Rfl: 0 .  cyclobenzaprine (FLEXERIL) 10 MG tablet, TAKE ONE TABLET BY MOUTH THREE TIMES DAILY AS NEEDED FOR MUSCLE SPASM, Disp: 90 tablet, Rfl: 5 .  ezetimibe-simvastatin (VYTORIN) 10-80 MG tablet, Take 1 tablet by mouth daily., Disp: , Rfl:  .  furosemide (LASIX) 20 MG tablet, TAKE ONE (1) TABLET EACH DAY, Disp: 30 tablet, Rfl: 0 .  levothyroxine (SYNTHROID, LEVOTHROID) 75 MCG tablet, TAKE ONE (1) TABLET EACH DAY, Disp: 90 tablet, Rfl: 2 .  levothyroxine (SYNTHROID, LEVOTHROID) 88 MCG tablet, Take 88 mcg by mouth daily., Disp: , Rfl:  .  metoprolol succinate (TOPROL XL) 50 MG 24 hr tablet, Take 1 tablet (50 mg total) by mouth 2 (two) times daily. Take with or immediately following a meal., Disp: 180 tablet, Rfl: 3 .  nitrofurantoin (MACRODANTIN) 100 MG capsule, Take 100 mg by mouth at bedtime., Disp: , Rfl:  .  omega-3 acid ethyl esters (LOVAZA) 1 G capsule, Take 1 g by mouth 2 (two) times  daily., Disp: , Rfl:  .  omeprazole (PRILOSEC) 20 MG capsule, TAKE ONE CAPSULE BY MOUTH TWICE A DAY, Disp: 60 capsule, Rfl: 0 .  Ospemifene (OSPHENA) 60 MG TABS, Take 1 tablet by mouth daily. Reported on 06/29/2015, Disp: , Rfl:  .  oxyCODONE-acetaminophen (PERCOCET/ROXICET) 5-325 MG tablet, Take 1 tablet by mouth every 6 (six) hours as needed for severe pain., Disp: 30 tablet, Rfl: 0 .  potassium chloride SA (K-DUR,KLOR-CON) 20 MEQ tablet, TAKE TWO TABLETS BY MOUTH TWICE DAILY, Disp: 120 tablet, Rfl: 0 .  PREMARIN vaginal cream, APPLY ONCE WEEKLY, Disp: 30 g, Rfl: 0 .  RESTASIS 0.05 % ophthalmic emulsion, INSTILL ONE DROP IN BOTH EYES TWICE DAILY, Disp: 60 each, Rfl: 0 .  triamterene-hydrochlorothiazide (MAXZIDE-25) 37.5-25 MG tablet, Take 1 tablet by mouth daily., Disp: , Rfl:  .  Venlafaxine HCl 225 MG TB24, Take 1 tablet (225 mg total) by mouth daily., Disp: 30 each, Rfl: 5 .  venlafaxine XR (EFFEXOR-XR) 75 MG 24 hr capsule, TAKE 3 CAPSULES EVERY MORNING, Disp: 90 capsule, Rfl: 0   BP (!) 87/60 (BP Location: Left Arm, Patient Position: Sitting, Cuff Size: Large)   Pulse 75    Physical Exam  Constitutional: She is oriented to person, place, and time. She appears well-developed and well-nourished. No distress.  HENT:  Head: Normocephalic and atraumatic.  Right Ear: External ear normal.  Left Ear: External ear normal.  Nose: Nose normal.  Mouth/Throat: Oropharynx is clear and moist.  Eyes: Conjunctivae and EOM are normal. Pupils are equal, round, and reactive to light. Right eye exhibits no discharge. Left eye exhibits no discharge. No scleral icterus.  Neck: Normal range of motion. Neck supple. No JVD present. No tracheal deviation present. No thyromegaly present.  Cardiovascular: Normal rate and intact distal pulses.   Pulmonary/Chest: Effort normal. No respiratory distress. She has no wheezes. She exhibits no tenderness.  Abdominal: Soft. She exhibits no distension and no mass. There is  no guarding. No hernia.  Lymphadenopathy:   She has no cervical adenopathy.  Neurological: She is alert and oriented to person, place, and time. She has normal reflexes.  Skin: Capillary refill takes less than 2 seconds. She is not diaphoretic.  Psychiatric: She has a normal mood and affect. Her behavior is normal. Judgment and thought content normal.  . We could not test balance because she cannot stand up. She has normal upper extremity coordination moving point to point   Ortho Exam Right and left Upper extremities are well aligned without contracture subluxation atrophy tremor skin rash or nodules. Pulse and color normal. Sensation normal. The left leg shows a bruise in the posterior thigh it is tender there is no crepitance or tenderness in the other portions of the lower extremity. Range of motion stability and strength is normal skin is normal otherwise. Pulse and temperature normal sensation normal.   ASSESSMENT: My personal interpretation of the images:  Imaging shows a trimalleolar fracture right ankle unstable will need surgery       PLAN Open reduction internal fixation right ankle

## 2016-02-02 NOTE — Brief Op Note (Addendum)
02/02/2016  12:44 PM  PATIENT:  [name]  65 y.o. female  PRE-OPERATIVE DIAGNOSIS:  right trimalleolar ankle fracture  POST-OPERATIVE DIAGNOSIS:  right trimalleolar ankle fracture  PROCEDURE:  Procedure(s): OPEN REDUCTION INTERNAL FIXATION (ORIF) ANKLE FRACTURE (Right) without fixation of posterior malleolus  Stryker cluster one third tubular plate and 2, 4.0 cannulated medial screws  Assisted by [facility]. anesthesia  No drains  No blood administered  Counts were correct  No complications  No specimens  60 mL of Marcaine with epinephrine local  Dragon Dictation  Patient will go to PACU and then we will decide whether or not she'll go home  No need to delay pharmacological VTE prophylaxis  SURGEON:  Surgeon(s) and Role:    * Carole Civil, MD - Primary   EBL:  Total I/O In: 700 [I.V.:700] Out: 20 [Blood:20]   TOURNIQUET:   Total Tourniquet Time Documented: Thigh (Right) - 77 minutes Total: Thigh (Right) - 77 minutes   Findings at surgery  The fracture had partially healed on the medial and lateral side.  Post fracture reduction fixation the posterior malleolar lip fracture had reduced and involve less than 25% of the joint surface  The surgery was done as follows  The patient was identified in the preop area the surgical site was confirmed and marked. Patient chart review and completed  The patient taken to surgery general anesthesia. Supine position. Bump under the right hip 5 pounds.  Sterile prep and drape  Timeout completed  Tourniquet elevated after exsanguination of the limb with a 4 inch Esmarch bandage.  Lateral incision was made down to bone extended proximally dividing the peroneal fascia and subperiosteal dissecting away from the fibula. The fracture had to be taken down. The fracture was debrided. Manual reduction was confirmed by C-arm and then a one third tubular plate with distal 4 hole  cluster was placed using AO  technique checking fracture reduction maintenance after placement of 4 screws  We then turned our attention to the medial side. He made a longitudinal curvilinear incision over the medial malleolus we took that down to bone we identified the neurovascular and tendinous structures posterior to the malleolus we opened the fracture we debrided it we removed debris from the joint there was some Talar damage proximal 5 mm with anterior lip met the talus.  We manually reduce this fracture and confirmed it by x-ray and placed pins and it to hold. We checked our overall reduction and found to still be intact and then overdrilled each K wire placed 240 cannulated partially threaded screws  Final x-rays including lateral x-ray confirmed reduction of the fracture fragments  The medial malleolar fragment had a area of bone defect which was treated with autogenous graft from the callus that was taken down from the lateral malleolus.  After thorough irrigation we closed the medial side with 2-0 Monocryl and staples we close the lateral side with 2-0 Monocryl and 0 Monocryl and staples  We injected 60 mL of Marcaine dividing 30 mL on each side including 20 mL into the ankle joint  Sterile dressings were applied tourniquet was released posterior splint was applied her graft extubation was completed and she was taken to recovery room in stable condition  The patient has indicated that she wants to go home. She will need a walker and probable gait training nonweightbearing. At least 4 transfers.  We will decide if she stable enough to go home in terms of her overall gait  safety.

## 2016-02-02 NOTE — Progress Notes (Signed)
Patient in extreme pain. Paged Dr. Romeo AppleHarrison and received verbal order for one time dose of Morphine 8 mg.

## 2016-02-02 NOTE — Addendum Note (Signed)
Addendum  created 02/02/16 1318 by Mal AmabileMichael Napoleon Monacelli, MD   Sign clinical note

## 2016-02-02 NOTE — Anesthesia Postprocedure Evaluation (Signed)
Anesthesia Post Note  Patient: [name]  Procedure(s) Performed: Procedure(s) (LRB): OPEN REDUCTION INTERNAL FIXATION (ORIF) ANKLE FRACTURE (Right)  Patient location during evaluation: PACU Anesthesia Type: General Level of consciousness: awake and alert and oriented Pain management: pain level controlled Vital Signs Assessment: post-procedure vital signs reviewed and stable Respiratory status: spontaneous breathing, nonlabored ventilation, respiratory function stable and patient connected to nasal cannula oxygen Cardiovascular status: blood pressure returned to baseline and stable Postop Assessment: no signs of nausea or vomiting Anesthetic complications: no    Last Vitals:  Vitals:   02/02/16 1251 02/02/16 1300  BP: (!) 166/88 (!) 139/96  Pulse:  (!) 106  Resp: 18 (!) 9  Temp: 36.5 C     Last Pain:  Vitals:   02/02/16 1251  TempSrc:   PainSc: 5                  Kyrollos Cordell A.

## 2016-02-02 NOTE — Anesthesia Postprocedure Evaluation (Signed)
Anesthesia Post Note  Patient: [name]  Procedure(s) Performed: Procedure(s) (LRB): OPEN REDUCTION INTERNAL FIXATION (ORIF) ANKLE FRACTURE (Right)  Patient location during evaluation: PACU Anesthesia Type: General Level of consciousness: awake, confused and patient cooperative Pain management: pain level controlled Vital Signs Assessment: post-procedure vital signs reviewed and stable Respiratory status: spontaneous breathing, nonlabored ventilation and respiratory function stable Cardiovascular status: blood pressure returned to baseline Postop Assessment: no signs of nausea or vomiting Anesthetic complications: no    Last Vitals:  Vitals:   02/02/16 1025 02/02/16 1251  BP: (!) 132/59 (!) 166/88  Resp: 19 18  Temp:  36.5 C    Last Pain:  Vitals:   02/02/16 1251  TempSrc:   PainSc: 5                  [name] J

## 2016-02-02 NOTE — Telephone Encounter (Signed)
Lauren called from floor at [facility], states patient (room 334) is still complaining of pain after receiving every pain medication that she can receive.  Direct ph# [phone].

## 2016-02-02 NOTE — Anesthesia Procedure Notes (Signed)
Procedure Name: Intubation Date/Time: 02/02/2016 10:50 AM Performed by: Despina HiddenIDACAVAGE, Jocelyn Lowery J Pre-anesthesia Checklist: Patient identified, Patient being monitored, Timeout performed, Emergency Drugs available and Suction available Patient Re-evaluated:Patient Re-evaluated prior to inductionOxygen Delivery Method: Circle System Utilized Preoxygenation: Pre-oxygenation with 100% oxygen Intubation Type: IV induction Ventilation: Mask ventilation without difficulty Laryngoscope Size: Mac and 3 Grade View: Grade II Tube type: Oral Tube size: 7.0 mm Number of attempts: 1 Airway Equipment and Method: stylet Placement Confirmation: ETT inserted through vocal cords under direct vision,  positive ETCO2 and breath sounds checked- equal and bilateral Secured at: 21 cm Tube secured with: Tape Dental Injury: Teeth and Oropharynx as per pre-operative assessment

## 2016-02-02 NOTE — Interval H&P Note (Signed)
History and Physical Interval Note:  02/02/2016 10:30 AM  [name] HammockSandra Conrad  has presented today for surgery, with the diagnosis of right trimalleolar ankle fracture  The various methods of treatment have been discussed with the patient and family. After consideration of risks, benefits and other options for treatment, the patient has consented to  Procedure(s): OPEN REDUCTION INTERNAL FIXATION (ORIF) ANKLE FRACTURE (Right) as a surgical intervention .  The patient's history has been reviewed, patient examined, no change in status, stable for surgery.  I have reviewed the patient's chart and labs.  Questions were answered to the patient's satisfaction.    BP (!) 132/59   Temp 97.7 F (36.5 C) (Oral)   Resp 19   Ht 5\' 6"  (1.676 m)   SpO2 100%   [name]

## 2016-02-02 NOTE — Transfer of Care (Signed)
Immediate Anesthesia Transfer of Care Note  Patient: Lavella HammockSandra Sommerfield  Procedure(s) Performed: Procedure(s): OPEN REDUCTION INTERNAL FIXATION (ORIF) ANKLE FRACTURE (Right)  Patient Location: PACU  Anesthesia Type:General  Level of Consciousness: awake and confused  Airway & Oxygen Therapy: Patient Spontanous Breathing and Patient connected to face mask oxygen  Post-op Assessment: Report given to RN, Post -op Vital signs reviewed and stable and Patient moving all extremities  Post vital signs: Reviewed and stable  Last Vitals:  Vitals:   02/02/16 1020 02/02/16 1025  BP: 131/66 (!) 132/59  Resp: 18 19  Temp:      Last Pain:  Vitals:   02/02/16 0948  TempSrc: Oral  PainSc: 7       Patients Stated Pain Goal: 10 (02/02/16 0948)  Complications: No apparent anesthesia complications

## 2016-02-02 NOTE — Op Note (Signed)
02/02/2016  12:44 PM  PATIENT:  [name]  65 y.o. female  PRE-OPERATIVE DIAGNOSIS:  right trimalleolar ankle fracture  POST-OPERATIVE DIAGNOSIS:  right trimalleolar ankle fracture  PROCEDURE:  Procedure(s): OPEN REDUCTION INTERNAL FIXATION (ORIF) ANKLE FRACTURE (Right) without fixation of posterior malleolus  Stryker cluster one third tubular plate and 2, 4.0 cannulated medial screws  Assisted by [place]. anesthesia  No drains  No blood administered  Counts were correct  No complications  No specimens  60 mL of Marcaine with epinephrine local  Dragon Dictation  Patient will go to PACU and then we will decide whether or not she'll go home  No need to delay pharmacological VTE prophylaxis  SURGEON:  Surgeon(s) and Role:    * Carole Civil, MD - Primary   EBL:  Total I/O In: 700 [I.V.:700] Out: 20 [Blood:20]   TOURNIQUET:   Total Tourniquet Time Documented: Thigh (Right) - 77 minutes Total: Thigh (Right) - 77 minutes   Findings at surgery  The fracture had partially healed on the medial and lateral side.  Post fracture reduction fixation the posterior malleolar lip fracture had reduced and involve less than 25% of the joint surface  The surgery was done as follows  The patient was identified in the preop area the surgical site was confirmed and marked. Patient chart review and completed  The patient taken to surgery general anesthesia. Supine position. Bump under the right hip 5 pounds.  Sterile prep and drape  Timeout completed  Tourniquet elevated after exsanguination of the limb with a 4 inch Esmarch bandage.  Lateral incision was made down to bone extended proximally dividing the peroneal fascia and subperiosteal dissecting away from the fibula. The fracture had to be taken down. The fracture was debrided. Manual reduction was confirmed by C-arm and then a one third tubular plate with distal 4 hole  cluster was placed using AO  technique checking fracture reduction maintenance after placement of 4 screws  We then turned our attention to the medial side. He made a longitudinal curvilinear incision over the medial malleolus we took that down to bone we identified the neurovascular and tendinous structures posterior to the malleolus we opened the fracture we debrided it we removed debris from the joint there was some Talar damage proximal 5 mm with anterior lip met the talus.  We manually reduce this fracture and confirmed it by x-ray and placed pins and it to hold. We checked our overall reduction and found to still be intact and then overdrilled each K wire placed 240 cannulated partially threaded screws  Final x-rays including lateral x-ray confirmed reduction of the fracture fragments  The medial malleolar fragment had a area of bone defect which was treated with autogenous graft from the callus that was taken down from the lateral malleolus.  After thorough irrigation we closed the medial side with 2-0 Monocryl and staples we close the lateral side with 2-0 Monocryl and 0 Monocryl and staples  We injected 60 mL of Marcaine dividing 30 mL on each side including 20 mL into the ankle joint  Sterile dressings were applied tourniquet was released posterior splint was applied her graft extubation was completed and she was taken to recovery room in stable condition  The patient has indicated that she wants to go home. She will need a walker and probable gait training nonweightbearing. At least 4 transfers.  We will decide if she stable enough to go home in terms of her overall gait  safety.

## 2016-02-03 DIAGNOSIS — S82851A Displaced trimalleolar fracture of right lower leg, initial encounter for closed fracture: Secondary | ICD-10-CM | POA: Diagnosis not present

## 2016-02-03 LAB — BASIC METABOLIC PANEL
Anion gap: 6 (ref 5–15)
BUN: 18 mg/dL (ref 6–20)
CO2: 27 mmol/L (ref 22–32)
CREATININE: 0.89 mg/dL (ref 0.44–1.00)
Calcium: 8.9 mg/dL (ref 8.9–10.3)
Chloride: 101 mmol/L (ref 101–111)
GFR calc Af Amer: >60 mL/min (ref >60)
Glucose, Bld: 124 mg/dL — ABNORMAL HIGH (ref 65–99)
Potassium: 3.8 mmol/L (ref 3.5–5.1)
SODIUM: 134 mmol/L — AB (ref 135–145)

## 2016-02-03 LAB — CBC
HEMATOCRIT: 37.8 % (ref 36.0–46.0)
HEMOGLOBIN: 12.5 g/dL (ref 12.0–15.0)
MCH: 30 pg (ref 26.0–34.0)
MCHC: 33.1 g/dL (ref 30.0–36.0)
MCV: 90.9 fL (ref 78.0–100.0)
PLATELETS: 223 10*3/uL (ref 150–400)
RBC: 4.16 MIL/uL (ref 3.87–5.11)
RDW: 12.7 % (ref 11.5–15.5)
WBC: 10.8 10*3/uL — ABNORMAL HIGH (ref 4.0–10.5)

## 2016-02-03 MED ORDER — OXYCODONE-ACETAMINOPHEN 5-325 MG PO TABS
2.0000 | ORAL_TABLET | ORAL | Status: DC
  Administered 2016-02-03: 2 via ORAL
  Filled 2016-02-03: qty 2

## 2016-02-03 MED ORDER — ENOXAPARIN SODIUM 40 MG/0.4ML ~~LOC~~ SOLN: 40.0000 mg | SUBCUTANEOUS | Status: DC

## 2016-02-03 MED ORDER — ALBUTEROL SULFATE (2.5 MG/3ML) 0.083% IN NEBU: 2.5000 mg | INHALATION_SOLUTION | RESPIRATORY_TRACT | Status: DC | PRN

## 2016-02-03 MED ORDER — OXYCODONE-ACETAMINOPHEN 7.5-325 MG PO TABS: 1.0000 | ORAL_TABLET | ORAL | 0 refills | Status: DC | PRN

## 2016-02-03 NOTE — Care Management Note (Signed)
Case Management Note  Patient Details  Name: [name] MRN: [id number] Date of Birth: 08/21/1950 Expected Discharge Date:   02/03/2016               Expected Discharge Plan:  Home w Home Health Services  In-House Referral:  NA  Discharge planning Services  CM Consult  Post Acute Care Choice:  Durable Medical Equipment Choice offered to:  Patient  DME Arranged:  Dan HumphreysWalker, Wheelchair manual, Bedside commode DME Agency:  [facility]  [facility]  Status of Service:  Completed, signed off  If discussed at Long Length of Stay Meetings, dates discussed:    Additional Comments: Patient discharging home. PT recommends WC, Walker, BSC, HHPT. Alroy BailiffLinda Lothian [facility] notified and will obtain orders from chart. Patient aware [facility] has 48 hours to make first visit. All DME delivered to room prior to discharge.   [name], Chrystine OilerSharley Diane, RN 02/03/2016, 11:24 AM

## 2016-02-03 NOTE — Discharge Summary (Signed)
Physician Discharge Summary  Patient ID: [name] HammockSandra [name] MRN: [id number] DOB/AGE: 64/08/1950 64 y.o.  Admit date: 02/02/2016 Discharge date: 02/03/2016  Admission Diagnoses: trimalleolar right ankle fracture malunion  Discharge Diagnoses: same Active Problems:   Fracture of ankle, trimalleolar, right, closed   Trimalleolar fracture of right ankle   Discharged Condition: good  Hospital Course:  02/02/16 ORIF RIGHT ANKLE STRYKER PLATES AND SCREWS  -KEPT IN HOUSE FOR GAIT TRAINING AND PAIN CONTROL  02/03/16 PT GAIT TRAINING, PAIN CONTROLLED WITH MORPHINE AND THEN 2 PERCOCET       Discharge Exam: Blood pressure (!) 92/54, pulse 73, temperature 97.8 F (36.6 C), temperature source Oral, resp. rate 20, height 5\' 6"  (1.676 m), weight 225 lb 1.4 oz (102.1 kg), SpO2 100 %. CAST INTACT, TOES LOOK GOOD COLOR NORMAL   Disposition: 01-Home or Self Care     Medication List    STOP taking these medications   oxyCODONE-acetaminophen 5-325 MG tablet Commonly known as:  PERCOCET/ROXICET Replaced by:  oxyCODONE-acetaminophen 7.5-325 MG tablet     TAKE these medications   AMITIZA 24 MCG capsule Generic drug:  lubiprostone TAKE ONE CAPSULE BY MOUTH TWICE A DAY   aspirin EC 81 MG tablet Take by mouth. Take 81 mg by mouth daily.   atorvastatin 80 MG tablet Commonly known as:  LIPITOR TAKE ONE (1) TABLET EACH DAY   CALCIUM 500 + D 500-125 MG-UNIT Tabs Generic drug:  Calcium Carbonate-Vitamin D Take by mouth. Take 1 tablet by mouth every morning.   chlorthalidone 25 MG tablet Commonly known as:  HYGROTON TAKE ONE TABLET BY MOUTH TWICE DAILY   clonazePAM 2 MG tablet Commonly known as:  KLONOPIN TAKE 1 TABLET TWICE DAILY AS NEEDED FOR ANXIETY   cyclobenzaprine 10 MG tablet Commonly known as:  FLEXERIL TAKE ONE TABLET BY MOUTH THREE TIMES DAILY AS NEEDED FOR MUSCLE SPASM   ezetimibe-simvastatin 10-80 MG tablet Commonly known as:  VYTORIN Take 1 tablet by mouth daily.    furosemide 20 MG tablet Commonly known as:  LASIX TAKE ONE (1) TABLET EACH DAY   levothyroxine 75 MCG tablet Commonly known as:  SYNTHROID, LEVOTHROID TAKE ONE (1) TABLET EACH DAY What changed:  Another medication with the same name was removed. Continue taking this medication, and follow the directions you see here.   metoprolol succinate 50 MG 24 hr tablet Commonly known as:  TOPROL XL Take 1 tablet (50 mg total) by mouth 2 (two) times daily. Take with or immediately following a meal.   nitrofurantoin 100 MG capsule Commonly known as:  MACRODANTIN Take 100 mg by mouth at bedtime.   omega-3 acid ethyl esters 1 g capsule Commonly known as:  LOVAZA Take 1 g by mouth 2 (two) times daily.   omeprazole 20 MG capsule Commonly known as:  PRILOSEC TAKE ONE CAPSULE BY MOUTH TWICE A DAY   OSPHENA 60 MG Tabs Generic drug:  Ospemifene Take 1 tablet by mouth daily. Reported on 06/29/2015   oxyCODONE-acetaminophen 7.5-325 MG tablet Commonly known as:  PERCOCET Take 1 tablet by mouth every 4 (four) hours as needed for severe pain. Replaces:  oxyCODONE-acetaminophen 5-325 MG tablet   potassium chloride SA 20 MEQ tablet Commonly known as:  K-DUR,KLOR-CON TAKE TWO TABLETS BY MOUTH TWICE DAILY   PREMARIN vaginal cream Generic drug:  conjugated estrogens APPLY ONCE WEEKLY   RESTASIS 0.05 % ophthalmic emulsion Generic drug:  cycloSPORINE INSTILL ONE DROP IN BOTH EYES TWICE DAILY   triamterene-hydrochlorothiazide 37.5-25 MG tablet Commonly known as:  MAXZIDE-25 Take 1 tablet by mouth daily.   venlafaxine XR 75 MG 24 hr capsule Commonly known as:  EFFEXOR-XR TAKE 3 CAPSULES EVERY MORNING   VITAMIN D-3 PO Take 1 capsule by mouth every morning.        Signed: Fuller CanadaStanley Penny Frisbie 02/03/2016, 7:46 AM

## 2016-02-03 NOTE — Discharge Instructions (Signed)
No weight bearing on right foot

## 2016-02-03 NOTE — Evaluation (Signed)
Physical Therapy Evaluation Patient Details Name: [name] MRN: [id number] DOB: 04/04/1951 Today's Date: 02/03/2016   History of Present Illness  65 year old female with multiple medical problems as listed below comes in with history of fall injuring the right ankle July 20. She had closed reduction the emergency room Followed by splint with good reduction.  Imaging shows a trimalleolar fracture right ankle unstable will need surgery.  S/P OPEN REDUCTION INTERNAL FIXATION (ORIF) ANKLE FRACTURE (Right) without fixation of posterior malleolus on 02/02/2016.  She will need a walker and probable gait training nonweightbearing. At least 4 transfers.  PMH: anxiety, arthritis, borderline HTN, chronic LBP, chronic shoulder pain, depression, Fe deficiency anemia, hiatal hernia, mixed drug abuse, hypothyroidism, lumbar herniated disc L4-5, microscopic hematuria, obesity, venous thrombosis, and embolism, PSVT, appendectomy, back surgery, carpal tunnel release, R knee arthroscopy and replacement, shoulder surgery.   Clinical Impression  Pt received in bed, and was agreeable to PT evaluation.  Pt is falling asleep during PLOF interview. Pt states she normally lives alone and is independent with ambulation and ADL's.  Since the fall on July 20th she was using crutches at home.  During PT evaluation today she was independent with bed mobility, Min A for Sit<>stand transfers, but Min/Mod A for SPT's due to unsteadiness and need for increased cues.  She ambulated 9310ft with RW and NWB on the R LE.  She is recommended to d/c home with HHPT, 24/7 supervision/assistance, W/C with elevating leg rests, RW, and BSC.      Follow Up Recommendations Home health PT;Supervision/Assistance - 24 hour    Equipment Recommendations  Rolling walker with 5" wheels;Wheelchair (measurements PT);3in1 (PT) (w/c with elevating leg rests.  )    Recommendations for Other Services       Precautions / Restrictions  Precautions Precautions: Fall Precaution Comments: Pt states she was trying to go to the bathroom when she fell.  Restrictions Weight Bearing Restrictions: Yes RLE Weight Bearing: Non weight bearing      Mobility  Bed Mobility Overal bed mobility: Independent             General bed mobility comments: bed flat, increased time  Transfers Overall transfer level: Needs assistance Equipment used: Rolling walker (2 wheeled) Transfers: Sit to/from UGI CorporationStand;Stand Pivot Transfers Sit to Stand: Min assist (vc's for hand placement) Stand pivot transfers: Min assist;Mod assist (vc's for RW negotiation)       General transfer comment: Pt required assistance for peri-hygiene after toileting.    Ambulation/Gait Ambulation/Gait assistance: Min assist Ambulation Distance (Feet): 10 Feet Assistive device: Rolling walker (2 wheeled)       General Gait Details: 2 point gait.  Pt able to maintain NWB on the R LE during gait and transfers, however she becomes shakey, and states it is due to her depression, and that happens all the time.  Educated pt on coming up onto L toes prior to L LE step advancement instead of hopping with hard impact on the floor. Pt required 1 standing rest break where she performed tripod position (forearms resting) on the RW.  Educated pt on using hands instead of forearms on RW to improve leverage with UE's.   Stairs            Wheelchair Mobility    Modified Rankin (Stroke Patients Only)       Balance Overall balance assessment: Needs assistance   Sitting balance-Leahy Scale: Good     Standing balance support: Bilateral upper extremity supported Standing balance-Leahy Scale: Poor  Pertinent Vitals/Pain Pain Assessment: 0-10 Pain Score: 8  Pain Location: R ankle and foot.  Pain Descriptors / Indicators: Aching Pain Intervention(s): Limited activity within patient's tolerance;Monitored during session     Home Living   Living Arrangements: Alone Available Help at Discharge: Available PRN/intermittently (has 2 dtrs that live close by, both work. ) Type of Home: Mobile home Home Access: Stairs to enter Entrance Stairs-Rails: Can reach both Entrance Stairs-Number of Steps: 8 at the front and 4 going out the back - pt uses the back. Home Layout: One level Home Equipment: Toilet riser;Crutches (has a shower chair, but needs someone to pick it up)      Prior Function     Gait / Transfers Assistance Needed: Before July 20th she was independent, and then after the fall on July 20th she was using crutches.    ADL's / Homemaking Assistance Needed: Pt states that she was independent with dressing, and she was showering in the shower on one leg - educated on poor safety choice         Hand Dominance   Dominant Hand: Left    Extremity/Trunk Assessment   Upper Extremity Assessment: Overall WFL for tasks assessed           Lower Extremity Assessment: Generalized weakness;RLE deficits/detail RLE Deficits / Details: in cast/splint, hip flexors 4/5, and knee extensors 3/5       Communication   Communication:  (lethargic)  Cognition Arousal/Alertness: Lethargic Behavior During Therapy: WFL for tasks assessed/performed Overall Cognitive Status: Within Functional Limits for tasks assessed                      General Comments      Exercises        Assessment/Plan    PT Assessment Patient needs continued PT services  PT Diagnosis Difficulty walking;Abnormality of gait;Generalized weakness;Acute pain   PT Problem List Decreased strength;Decreased range of motion;Decreased activity tolerance;Decreased balance;Decreased mobility;Decreased cognition;Decreased knowledge of use of DME;Decreased safety awareness;Decreased knowledge of precautions;Obesity;Pain  PT Treatment Interventions DME instruction;Gait training;Functional mobility training;Stair training;Therapeutic  activities;Therapeutic exercise;Balance training;Patient/family education   PT Goals (Current goals can be found in the Care Plan section) Acute Rehab PT Goals Patient Stated Goal: Pt wants to go home PT Goal Formulation: With patient/family Time For Goal Achievement: 02/10/16 Potential to Achieve Goals: Good    Frequency 7X/week   Barriers to discharge Decreased caregiver support;Inaccessible home environment Pt lives alone, but dtr will be staying with her for 1 week.  Pt has 4 steps to get in/out of the house.     Co-evaluation               End of Session Equipment Utilized During Treatment: Gait belt Activity Tolerance: Patient tolerated treatment well Patient left: in chair;with call bell/phone within reach;with family/visitor present Nurse Communication: Mobility status;Weight bearing status (NWB R LE)    Functional Assessment Tool Used: [employer] "6-clicks"  Functional Limitation: Mobility: Walking and moving around Mobility: Walking and Moving Around Current Status [phone]): At least 40 percent but less than 60 percent impaired, limited or restricted Mobility: Walking and Moving Around Goal Status [phone]): At least 20 percent but less than 40 percent impaired, limited or restricted    Time: 0981-1914 PT Time Calculation (min) (ACUTE ONLY): 47 min   Charges:   PT Evaluation $PT Eval Moderate Complexity: 1 Procedure PT Treatments $Gait Training: 8-22 mins $Therapeutic Activity: 8-22 mins   PT G Codes:  PT G-Codes **NOT FOR INPATIENT CLASS** Functional Assessment Tool Used: [employer] "6-clicks"  Functional Limitation: Mobility: Walking and moving around Mobility: Walking and Moving Around Current Status [phone]): At least 40 percent but less than 60 percent impaired, limited or restricted Mobility: Walking and Moving Around Goal Status [phone]): At least 20 percent but less than 40 percent impaired, limited or restricted    Beth  Bufford Helms, PT, DPT X: [phone]

## 2016-02-03 NOTE — Clinical Social Work Note (Signed)
CSW received consult for possible SNF. PT evaluated pt and recommend home health. CSW will sign off, but can be reconsulted if needed.  Keyonta Madrid, LCSW [phone] 

## 2016-02-07 ENCOUNTER — Telehealth: Payer: Self-pay | Admitting: Orthopedic Surgery

## 2016-02-07 ENCOUNTER — Encounter: Payer: Medicaid Other | Admitting: Family Medicine

## 2016-02-07 ENCOUNTER — Other Ambulatory Visit: Payer: Self-pay | Admitting: *Deleted

## 2016-02-07 DIAGNOSIS — S82891A Other fracture of right lower leg, initial encounter for closed fracture: Secondary | ICD-10-CM

## 2016-02-07 NOTE — Telephone Encounter (Signed)
APPROVED

## 2016-02-07 NOTE — Telephone Encounter (Signed)
ROUTING TO DR HARRISON FOR APPROVAL 

## 2016-02-07 NOTE — Telephone Encounter (Signed)
FAXED AS REQUESTED

## 2016-02-07 NOTE — Telephone Encounter (Signed)
Aviva SignsSharley the case worker from [employer] called and stated that she needs a prescription from Dr. Romeo AppleHarrison for this patient to have a bedside commode.  She said that this could be written on his prescription pad and faxed to her at [phone].  If you have any questions, please call Sharley at [phone].

## 2016-02-08 ENCOUNTER — Other Ambulatory Visit: Payer: Self-pay | Admitting: Family Medicine

## 2016-02-09 ENCOUNTER — Encounter (HOSPITAL_COMMUNITY): Payer: Self-pay | Admitting: Orthopedic Surgery

## 2016-02-09 ENCOUNTER — Ambulatory Visit: Payer: Medicaid Other | Admitting: Orthopedic Surgery

## 2016-02-09 VITALS — BP 110/90 | HR 76 | Ht 66.0 in | Wt 225.0 lb

## 2016-02-09 DIAGNOSIS — S82851D Displaced trimalleolar fracture of right lower leg, subsequent encounter for closed fracture with routine healing: Secondary | ICD-10-CM

## 2016-02-09 DIAGNOSIS — Z4789 Encounter for other orthopedic aftercare: Secondary | ICD-10-CM

## 2016-02-09 MED ORDER — OXYCODONE-ACETAMINOPHEN 7.5-325 MG PO TABS: 1.0000 | ORAL_TABLET | ORAL | 0 refills | Status: DC | PRN

## 2016-02-09 NOTE — Progress Notes (Signed)
Patient ID: [name] HammockSandra [name], female   DOB: 07/14/1950, 65 y.o.   MRN: [id number]  Follow up visit  Chief Complaint  Patient presents with  . Follow-up    Post op #1, right ankle, ORIF, DOS 02-02-16.    Encounter Diagnosis  Name Primary?  . Surgical aftercare, musculoskeletal system Yes  Postop day 7 splint checked intact toes look good color looks good minimal swelling  Refill pain medication  Follow-up for x-rays out of plaster and staples to come out an application of short-leg cast  BP 110/90   Pulse 76   Ht 5\' 6"  (1.676 m)   Wt 225 lb (102.1 kg)   BMI 36.32 kg/m   5:08 PM Fuller CanadaStanley Harrison, MD 02/09/2016

## 2016-02-11 ENCOUNTER — Other Ambulatory Visit: Payer: Self-pay | Admitting: Family Medicine

## 2016-02-11 NOTE — Telephone Encounter (Signed)
Pt has had surgery so will close encounter.

## 2016-02-16 ENCOUNTER — Encounter: Payer: Self-pay | Admitting: Orthopedic Surgery

## 2016-02-16 ENCOUNTER — Ambulatory Visit: Payer: Medicaid Other | Admitting: Orthopedic Surgery

## 2016-02-16 ENCOUNTER — Ambulatory Visit (INDEPENDENT_AMBULATORY_CARE_PROVIDER_SITE_OTHER): Payer: Medicaid Other

## 2016-02-16 DIAGNOSIS — S82851D Displaced trimalleolar fracture of right lower leg, subsequent encounter for closed fracture with routine healing: Secondary | ICD-10-CM

## 2016-02-16 DIAGNOSIS — S82891A Other fracture of right lower leg, initial encounter for closed fracture: Secondary | ICD-10-CM

## 2016-02-16 DIAGNOSIS — Z4789 Encounter for other orthopedic aftercare: Secondary | ICD-10-CM

## 2016-02-16 MED ORDER — OXYCODONE-ACETAMINOPHEN 7.5-325 MG PO TABS: 1.0000 | ORAL_TABLET | ORAL | 0 refills | Status: DC | PRN

## 2016-02-16 NOTE — Progress Notes (Signed)
Patient ID: [name] HammockSandra [name], female   DOB: 05/26/1951, 65 y.o.   MRN: [id number]  Post op visit   Chief Complaint  Patient presents with  . Follow-up    POST OP 1, ORIF RIGHT ANKLE, DOS 02/02/16    2 weeks postop ORIF bimalleolar ankle fracture  X-ray shows displacement and loss of fixation medial malleolus  Suture staples removed  Splint change to cast   ASSESSMENT AND PLAN   Loss of fixation medial malleolus  Remove one medial malleolar screw insert medial malleolar screw  Patient aware  Surgery on Friday  Repeat open treatment internal fixation right ankle  Diagnosis right ankle fracture loss of fixation

## 2016-02-16 NOTE — Addendum Note (Signed)
Addended by: Adella HareBOOTHE, JAIME B on: 02/16/2016 04:05 PM   Modules accepted: Orders, SmartSet

## 2016-02-16 NOTE — Patient Instructions (Addendum)
NO WEIGHT BEARING   Hardware removal and reinsertion of screw medial malleolus

## 2016-02-17 ENCOUNTER — Encounter (HOSPITAL_COMMUNITY)
Admission: RE | Admit: 2016-02-17 | Discharge: 2016-02-17 | Disposition: A | Discharge status: Home / Self Care | Payer: Medicaid Other | Source: Ambulatory Visit | Attending: Orthopedic Surgery | Admitting: Orthopedic Surgery

## 2016-02-17 ENCOUNTER — Encounter (HOSPITAL_COMMUNITY): Payer: Self-pay | Admitting: *Deleted

## 2016-02-18 ENCOUNTER — Ambulatory Visit (HOSPITAL_COMMUNITY): Payer: Medicaid Other | Admitting: Anesthesiology

## 2016-02-18 ENCOUNTER — Encounter (HOSPITAL_COMMUNITY): Payer: Self-pay | Admitting: *Deleted

## 2016-02-18 ENCOUNTER — Encounter (HOSPITAL_COMMUNITY)
Admission: RE | Disposition: A | Discharge status: Home / Self Care | Payer: Self-pay | Source: Ambulatory Visit | Attending: Orthopedic Surgery

## 2016-02-18 ENCOUNTER — Ambulatory Visit (HOSPITAL_COMMUNITY)
Admission: RE | Admit: 2016-02-18 | Discharge: 2016-02-18 | Disposition: A | Discharge status: Home / Self Care | Payer: Medicaid Other | Source: Ambulatory Visit | Attending: Orthopedic Surgery | Admitting: Orthopedic Surgery

## 2016-02-18 ENCOUNTER — Ambulatory Visit (HOSPITAL_COMMUNITY): Payer: Medicaid Other

## 2016-02-18 DIAGNOSIS — M1990 Unspecified osteoarthritis, unspecified site: Secondary | ICD-10-CM | POA: Diagnosis not present

## 2016-02-18 DIAGNOSIS — S82841D Displaced bimalleolar fracture of right lower leg, subsequent encounter for closed fracture with routine healing: Secondary | ICD-10-CM

## 2016-02-18 DIAGNOSIS — Z6836 Body mass index (BMI) 36.0-36.9, adult: Secondary | ICD-10-CM | POA: Insufficient documentation

## 2016-02-18 DIAGNOSIS — Z4789 Encounter for other orthopedic aftercare: Secondary | ICD-10-CM

## 2016-02-18 DIAGNOSIS — Y838 Other surgical procedures as the cause of abnormal reaction of the patient, or of later complication, without mention of misadventure at the time of the procedure: Secondary | ICD-10-CM | POA: Insufficient documentation

## 2016-02-18 DIAGNOSIS — K219 Gastro-esophageal reflux disease without esophagitis: Secondary | ICD-10-CM | POA: Insufficient documentation

## 2016-02-18 DIAGNOSIS — Z7982 Long term (current) use of aspirin: Secondary | ICD-10-CM | POA: Diagnosis not present

## 2016-02-18 DIAGNOSIS — T84126A Displacement of internal fixation device of bone of right lower leg, initial encounter: Secondary | ICD-10-CM | POA: Insufficient documentation

## 2016-02-18 DIAGNOSIS — T84498D Other mechanical complication of other internal orthopedic devices, implants and grafts, subsequent encounter: Secondary | ICD-10-CM

## 2016-02-18 DIAGNOSIS — I10 Essential (primary) hypertension: Secondary | ICD-10-CM | POA: Insufficient documentation

## 2016-02-18 DIAGNOSIS — E669 Obesity, unspecified: Secondary | ICD-10-CM | POA: Diagnosis not present

## 2016-02-18 DIAGNOSIS — T84498A Other mechanical complication of other internal orthopedic devices, implants and grafts, initial encounter: Secondary | ICD-10-CM

## 2016-02-18 DIAGNOSIS — E039 Hypothyroidism, unspecified: Secondary | ICD-10-CM | POA: Diagnosis not present

## 2016-02-18 DIAGNOSIS — F419 Anxiety disorder, unspecified: Secondary | ICD-10-CM | POA: Insufficient documentation

## 2016-02-18 DIAGNOSIS — E785 Hyperlipidemia, unspecified: Secondary | ICD-10-CM | POA: Diagnosis not present

## 2016-02-18 DIAGNOSIS — T148XXA Other injury of unspecified body region, initial encounter: Secondary | ICD-10-CM

## 2016-02-18 DIAGNOSIS — S82843A Displaced bimalleolar fracture of unspecified lower leg, initial encounter for closed fracture: Secondary | ICD-10-CM

## 2016-02-18 HISTORY — PX: ORIF ANKLE FRACTURE: SHX5408

## 2016-02-18 HISTORY — PX: HARDWARE REMOVAL: SHX979

## 2016-02-18 SURGERY — REMOVAL, HARDWARE
Anesthesia: General | Site: Ankle | Laterality: Right

## 2016-02-18 MED ORDER — PROPOFOL 10 MG/ML IV BOLUS
INTRAVENOUS | Status: AC
  Filled 2016-02-18: qty 20

## 2016-02-18 MED ORDER — 0.9 % SODIUM CHLORIDE (POUR BTL) OPTIME
TOPICAL | Status: DC | PRN
  Administered 2016-02-18: 1000 mL

## 2016-02-18 MED ORDER — OXYCODONE HCL 5 MG PO TABS
5.0000 mg | ORAL_TABLET | Freq: Once | ORAL | Status: AC
  Administered 2016-02-18: 5 mg via ORAL
  Filled 2016-02-18: qty 1

## 2016-02-18 MED ORDER — FENTANYL CITRATE (PF) 100 MCG/2ML IJ SOLN
INTRAMUSCULAR | Status: DC | PRN
  Administered 2016-02-18: 25 ug via INTRAVENOUS
  Administered 2016-02-18: 50 ug via INTRAVENOUS

## 2016-02-18 MED ORDER — CEFAZOLIN SODIUM-DEXTROSE 2-4 GM/100ML-% IV SOLN
2.0000 g | INTRAVENOUS | Status: AC
  Administered 2016-02-18: 2 g via INTRAVENOUS

## 2016-02-18 MED ORDER — MIDAZOLAM HCL 2 MG/2ML IJ SOLN
1.0000 mg | INTRAMUSCULAR | Status: DC | PRN
  Administered 2016-02-18 (×2): 2 mg via INTRAVENOUS
  Filled 2016-02-18: qty 2

## 2016-02-18 MED ORDER — FENTANYL CITRATE (PF) 250 MCG/5ML IJ SOLN
INTRAMUSCULAR | Status: AC
  Filled 2016-02-18: qty 5

## 2016-02-18 MED ORDER — FENTANYL CITRATE (PF) 100 MCG/2ML IJ SOLN
INTRAMUSCULAR | Status: AC
  Filled 2016-02-18: qty 2

## 2016-02-18 MED ORDER — OXYCODONE-ACETAMINOPHEN 7.5-325 MG PO TABS: 1.0000 | ORAL_TABLET | ORAL | 0 refills | Status: AC | PRN

## 2016-02-18 MED ORDER — MIDAZOLAM HCL 2 MG/2ML IJ SOLN
INTRAMUSCULAR | Status: AC
  Filled 2016-02-18: qty 2

## 2016-02-18 MED ORDER — BUPIVACAINE-EPINEPHRINE (PF) 0.5% -1:200000 IJ SOLN
INTRAMUSCULAR | Status: AC
  Filled 2016-02-18: qty 60

## 2016-02-18 MED ORDER — HYDROMORPHONE HCL 1 MG/ML IJ SOLN: 0.2500 mg | INTRAMUSCULAR | Status: DC | PRN

## 2016-02-18 MED ORDER — CEFAZOLIN SODIUM-DEXTROSE 2-4 GM/100ML-% IV SOLN
INTRAVENOUS | Status: AC
  Filled 2016-02-18: qty 100

## 2016-02-18 MED ORDER — FENTANYL CITRATE (PF) 100 MCG/2ML IJ SOLN
25.0000 ug | INTRAMUSCULAR | Status: AC | PRN
  Administered 2016-02-18 (×2): 25 ug via INTRAVENOUS

## 2016-02-18 MED ORDER — BUPIVACAINE-EPINEPHRINE 0.5% -1:200000 IJ SOLN
INTRAMUSCULAR | Status: DC | PRN
  Administered 2016-02-18: 60 mL

## 2016-02-18 MED ORDER — LIDOCAINE HCL (PF) 1 % IJ SOLN
INTRAMUSCULAR | Status: AC
  Filled 2016-02-18: qty 10

## 2016-02-18 MED ORDER — SODIUM CHLORIDE 0.9 % IJ SOLN
INTRAMUSCULAR | Status: AC
  Filled 2016-02-18: qty 20

## 2016-02-18 MED ORDER — PROPOFOL 10 MG/ML IV BOLUS
INTRAVENOUS | Status: DC | PRN
  Administered 2016-02-18: 160 mg via INTRAVENOUS

## 2016-02-18 MED ORDER — ONDANSETRON HCL 4 MG/2ML IJ SOLN
4.0000 mg | Freq: Once | INTRAMUSCULAR | Status: AC
  Administered 2016-02-18: 4 mg via INTRAVENOUS

## 2016-02-18 MED ORDER — ONDANSETRON HCL 4 MG/2ML IJ SOLN
INTRAMUSCULAR | Status: AC
  Filled 2016-02-18: qty 2

## 2016-02-18 MED ORDER — LIDOCAINE HCL (CARDIAC) 10 MG/ML IV SOLN
INTRAVENOUS | Status: DC | PRN
  Administered 2016-02-18: 40 mg via INTRAVENOUS

## 2016-02-18 MED ORDER — CHLORHEXIDINE GLUCONATE 4 % EX LIQD: 60.0000 mL | Freq: Once | CUTANEOUS | Status: DC

## 2016-02-18 MED ORDER — EPHEDRINE SULFATE 50 MG/ML IJ SOLN
INTRAMUSCULAR | Status: AC
  Filled 2016-02-18: qty 1

## 2016-02-18 MED ORDER — ONDANSETRON HCL 4 MG/2ML IJ SOLN
4.0000 mg | Freq: Once | INTRAMUSCULAR | Status: AC
  Administered 2016-02-18: 4 mg via INTRAVENOUS
  Filled 2016-02-18: qty 2

## 2016-02-18 MED ORDER — LACTATED RINGERS IV SOLN
INTRAVENOUS | Status: DC
  Administered 2016-02-18 (×2): via INTRAVENOUS

## 2016-02-18 SURGICAL SUPPLY — 80 items
BAG HAMPER (MISCELLANEOUS) ×2 IMPLANT
BANDAGE ACE 4 STERILE (GAUZE/BANDAGES/DRESSINGS) ×2 IMPLANT
BANDAGE ELASTIC 3 LF NS (GAUZE/BANDAGES/DRESSINGS) ×2 IMPLANT
BANDAGE ELASTIC 4 LF NS (GAUZE/BANDAGES/DRESSINGS) ×4 IMPLANT
BANDAGE ELASTIC 6 VELCRO NS (GAUZE/BANDAGES/DRESSINGS) IMPLANT
BANDAGE ESMARK 4X12 BL STRL LF (DISPOSABLE) ×1 IMPLANT
BIT DRILL 3.5X122MM AO FIT (BIT) IMPLANT
BIT DRILL CANN 2.7 (BIT)
BIT DRILL CANN 3.2MM (BIT) IMPLANT
BIT DRILL SRG 2.7XCANN AO CPLG (BIT) IMPLANT
BIT DRL SRG 2.7XCANN AO CPLNG (BIT)
BLADE SURG SZ10 CARB STEEL (BLADE) ×2 IMPLANT
BNDG CMPR 12X4 ELC STRL LF (DISPOSABLE) ×1
BNDG CMPR MED 5X3 ELC HKLP NS (GAUZE/BANDAGES/DRESSINGS) ×1
BNDG CMPR MED 5X4 ELC HKLP NS (GAUZE/BANDAGES/DRESSINGS) ×2
BNDG COHESIVE 4X5 TAN STRL (GAUZE/BANDAGES/DRESSINGS) ×2 IMPLANT
BNDG ESMARK 4X12 BLUE STRL LF (DISPOSABLE) ×2
CHLORAPREP W/TINT 26ML (MISCELLANEOUS) ×4 IMPLANT
CLOTH BEACON ORANGE TIMEOUT ST (SAFETY) ×2 IMPLANT
COVER LIGHT HANDLE STERIS (MISCELLANEOUS) ×4 IMPLANT
CUFF TOURNIQUET SINGLE 24IN (TOURNIQUET CUFF) IMPLANT
CUFF TOURNIQUET SINGLE 34IN LL (TOURNIQUET CUFF) ×1 IMPLANT
CUFF TOURNIQUET SINGLE 44IN (TOURNIQUET CUFF) ×2 IMPLANT
DECANTER SPIKE VIAL GLASS SM (MISCELLANEOUS) ×4 IMPLANT
DRAPE C-ARM FOLDED MOBILE STRL (DRAPES) ×2 IMPLANT
DRAPE OEC MINIVIEW 54X84 (DRAPES) IMPLANT
DRAPE PROXIMA HALF (DRAPES) ×2 IMPLANT
DRILL 2.6X122MM WL AO SHAFT (BIT) IMPLANT
DRILL BIT CANN 3.2MM (BIT) ×1
ELECT REM PT RETURN 9FT ADLT (ELECTROSURGICAL) ×2
ELECTRODE REM PT RTRN 9FT ADLT (ELECTROSURGICAL) ×1 IMPLANT
GAUZE SPONGE 4X4 12PLY STRL (GAUZE/BANDAGES/DRESSINGS) ×2 IMPLANT
GAUZE XEROFORM 5X9 LF (GAUZE/BANDAGES/DRESSINGS) ×2 IMPLANT
GLOVE BIOGEL PI IND STRL 7.0 (GLOVE) ×2 IMPLANT
GLOVE BIOGEL PI INDICATOR 7.0 (GLOVE) ×2
GLOVE SKINSENSE NS SZ8.0 LF (GLOVE) ×1
GLOVE SKINSENSE STRL SZ8.0 LF (GLOVE) ×1 IMPLANT
GLOVE SS N UNI LF 8.5 STRL (GLOVE) ×2 IMPLANT
GOWN STRL REUS W/TWL LRG LVL3 (GOWN DISPOSABLE) ×4 IMPLANT
GOWN STRL REUS W/TWL XL LVL3 (GOWN DISPOSABLE) ×2 IMPLANT
GUIDEWIRE THREADED 1.6 (WIRE) ×1 IMPLANT
HEMOSTAT SURGICEL 4X8 (HEMOSTASIS) ×1 IMPLANT
INST SET MINOR BONE (KITS) ×2 IMPLANT
K-WIRE 1.4X100 (WIRE)
K-WIRE 1.6X150 (WIRE)
K-WIRE FX150X1.6XKRSH (WIRE)
K-WIRE SMOOTH 2.0X150 (WIRE)
KIT ROOM TURNOVER APOR (KITS) ×2 IMPLANT
KWIRE 1.4X100 (WIRE) IMPLANT
KWIRE FX150X1.6XKRSH (WIRE) IMPLANT
KWIRE SMOOTH 2.0X150 (WIRE) IMPLANT
MANIFOLD NEPTUNE II (INSTRUMENTS) ×2 IMPLANT
NDL HYPO 21X1 ECLIPSE (NEEDLE) ×1 IMPLANT
NDL HYPO 21X1.5 SAFETY (NEEDLE) ×1 IMPLANT
NEEDLE HYPO 21X1 ECLIPSE (NEEDLE) ×2 IMPLANT
NEEDLE HYPO 21X1.5 SAFETY (NEEDLE) ×2 IMPLANT
NS IRRIG 1000ML POUR BTL (IV SOLUTION) ×2 IMPLANT
PACK BASIC LIMB (CUSTOM PROCEDURE TRAY) ×2 IMPLANT
PAD ABD 5X9 TENDERSORB (GAUZE/BANDAGES/DRESSINGS) ×3 IMPLANT
PAD ARMBOARD 7.5X6 YLW CONV (MISCELLANEOUS) ×2 IMPLANT
PAD CAST 4YDX4 CTTN HI CHSV (CAST SUPPLIES) ×1 IMPLANT
PADDING CAST COTTON 4X4 STRL (CAST SUPPLIES) ×2
PADDING WEBRIL 4 STERILE (GAUZE/BANDAGES/DRESSINGS) ×2 IMPLANT
SCREW CANN P THRD/50 4.5 (Screw) ×1 IMPLANT
SET BASIN LINEN APH (SET/KITS/TRAYS/PACK) ×2 IMPLANT
SPLINT IMMOBILIZER J 3INX20FT (CAST SUPPLIES)
SPLINT J IMMOBILIZER 3X20FT (CAST SUPPLIES) IMPLANT
SPLINT J IMMOBILIZER 4X20FT (CAST SUPPLIES) IMPLANT
SPLINT J PLASTER J 4INX20Y (CAST SUPPLIES)
SPONGE LAP 18X18 X RAY DECT (DISPOSABLE) ×2 IMPLANT
SPONGE LAP 4X18 X RAY DECT (DISPOSABLE) ×2 IMPLANT
STAPLER VISISTAT 35W (STAPLE) ×2 IMPLANT
SUT ETHILON 3 0 FSL (SUTURE) ×2 IMPLANT
SUT MON AB 0 CT1 (SUTURE) ×2 IMPLANT
SUT MON AB 2-0 CT1 36 (SUTURE) ×2 IMPLANT
SUT PROLENE 4 0 PS 2 18 (SUTURE) IMPLANT
SUT VIC AB 0 CT1 27 (SUTURE) ×2
SUT VIC AB 0 CT1 27XBRD ANTBC (SUTURE) IMPLANT
SYR 30ML LL (SYRINGE) ×2 IMPLANT
SYR BULB IRRIGATION 50ML (SYRINGE) ×4 IMPLANT

## 2016-02-18 NOTE — Discharge Instructions (Signed)
PATIENT INSTRUCTIONS °POST-ANESTHESIA ° °IMMEDIATELY FOLLOWING SURGERY:  Do not drive or operate machinery for the first twenty four hours after surgery.  Do not make any important decisions for twenty four hours after surgery or while taking narcotic pain medications or sedatives.  If you develop intractable nausea and vomiting or a severe headache please notify your doctor immediately. ° °FOLLOW-UP:  Please make an appointment with your surgeon as instructed. You do not need to follow up with anesthesia unless specifically instructed to do so. ° °WOUND CARE INSTRUCTIONS (if applicable):  Keep a dry clean dressing on the anesthesia/puncture wound site if there is drainage.  Once the wound has quit draining you may leave it open to air.  Generally you should leave the bandage intact for twenty four hours unless there is drainage.  If the epidural site drains for more than 36-48 hours please call the anesthesia department. ° °QUESTIONS?:  Please feel free to call your physician or the hospital operator if you have any questions, and they will be happy to assist you.    ° ° °Wound Care °Taking care of your wound properly can help to prevent pain and infection. It can also help your wound to heal more quickly.  °HOW TO CARE FOR YOUR WOUND  °· Take or apply over-the-counter and prescription medicines only as told by your health care provider. °· If you were prescribed antibiotic medicine, take or apply it as told by your health care provider. Do not stop using the antibiotic even if your condition improves. °· Clean the wound each day or as told by your health care provider. °¨ Wash the wound with mild soap and water. °¨ Rinse the wound with water to remove all soap. °¨ Pat the wound dry with a clean towel. Do not rub it. °· There are many different ways to close and cover a wound. For example, a wound can be covered with stitches (sutures), skin glue, or adhesive strips. Follow instructions from your health care  provider about: °¨ How to take care of your wound. °¨ When and how you should change your bandage (dressing). °¨ When you should remove your dressing. °¨ Removing whatever was used to close your wound. °· Check your wound every day for signs of infection. Watch for: °¨ Redness, swelling, or pain. °¨ Fluid, blood, or pus. °· Keep the dressing dry until your health care provider says it can be removed. Do not take baths, swim, use a hot tub, or do anything that would put your wound underwater until your health care provider approves. °· Raise (elevate) the injured area above the level of your heart while you are sitting or lying down. °· Do not scratch or pick at the wound. °· Keep all follow-up visits as told by your health care provider. This is important. °SEEK MEDICAL CARE IF: °· You received a tetanus shot and you have swelling, severe pain, redness, or bleeding at the injection site. °· You have a fever. °· Your pain is not controlled with medicine. °· You have increased redness, swelling, or pain at the site of your wound. °· You have fluid, blood, or pus coming from your wound. °· You notice a bad smell coming from your wound or your dressing. °SEEK IMMEDIATE MEDICAL CARE IF: °· You have a red streak going away from your wound. °  °This information is not intended to replace advice given to you by your health care provider. Make sure you discuss any questions you have with   your health care provider. °  °Document Released: 03/07/2008 Document Revised: 10/13/2014 Document Reviewed: 05/25/2014 °Elsevier Interactive Patient Education ©2016 [employer] ° °

## 2016-02-18 NOTE — Anesthesia Preprocedure Evaluation (Signed)
Anesthesia Evaluation  Patient identified by MRN, date of birth, ID band Patient awake    Reviewed: Allergy & Precautions, NPO status , Patient's Chart, lab work & pertinent test results, reviewed documented beta blocker date and time   Airway Mallampati: III  TM Distance: >3 FB Neck ROM: Full    Dental no notable dental hx. (+) Teeth Intact   Pulmonary shortness of breath and with exertion,    Pulmonary exam normal breath sounds clear to auscultation       Cardiovascular hypertension, Pt. on medications and Pt. on home beta blockers Normal cardiovascular exam+ dysrhythmias Supra Ventricular Tachycardia  Rhythm:Regular Rate:Normal     Neuro/Psych PSYCHIATRIC DISORDERS Anxiety Depression  Neuromuscular disease    GI/Hepatic Neg liver ROS, hiatal hernia, GERD  Medicated and Controlled,  Endo/Other  Hypothyroidism Obesity Hyperlipidemia  Renal/GU negative Renal ROS  negative genitourinary   Musculoskeletal  (+) Arthritis , Trimalleolar Fx Right ankle   Abdominal (+) + obese,   Peds  Hematology  (+) anemia ,   Anesthesia Other Findings   Reproductive/Obstetrics                             Anesthesia Physical Anesthesia Plan  ASA: II  Anesthesia Plan: General   Post-op Pain Management:    Induction: Intravenous  Airway Management Planned: LMA  Additional Equipment:   Intra-op Plan:   Post-operative Plan: Extubation in OR  Informed Consent: I have reviewed the patients History and Physical, chart, labs and discussed the procedure including the risks, benefits and alternatives for the proposed anesthesia with the patient or authorized representative who has indicated his/her understanding and acceptance.   Dental advisory given  Plan Discussed with: CRNA, Anesthesiologist and Surgeon  Anesthesia Plan Comments:         Anesthesia Quick Evaluation

## 2016-02-18 NOTE — Anesthesia Procedure Notes (Signed)
Procedure Name: LMA Insertion Date/Time: 02/18/2016 2:26 PM Performed by: Glynn OctaveANIEL, Gianmarco Roye E Pre-anesthesia Checklist: Patient identified, Patient being monitored, Emergency Drugs available, Timeout performed and Suction available Patient Re-evaluated:Patient Re-evaluated prior to inductionOxygen Delivery Method: Circle System Utilized Preoxygenation: Pre-oxygenation with 100% oxygen Intubation Type: IV induction Ventilation: Mask ventilation without difficulty LMA: LMA inserted LMA Size: 4.0 Number of attempts: 1 Placement Confirmation: positive ETCO2 and breath sounds checked- equal and bilateral

## 2016-02-18 NOTE — Progress Notes (Signed)
Daughter Nadeen LandauLoretta Murphy stated at discharge,  "I have waited over and hour and a half waiting to talk to doctor after surgery."  Patient verbalized she needed to leave and go get her prescription filled before pharmacy closes.  MD paged. Daughter and patient requested to be discharged/

## 2016-02-18 NOTE — Transfer of Care (Signed)
Immediate Anesthesia Transfer of Care Note  Patient: Lavella HammockSandra Derner  Procedure(s) Performed: Procedure(s) with comments: HARDWARE REMOVAL (Right) - right ankle medial malleolar screw OPEN REDUCTION INTERNAL FIXATION (ORIF) ANKLE FRACTURE (Right) - reinsertion of medial malleolar screw  Patient Location: PACU  Anesthesia Type:General  Level of Consciousness: awake  Airway & Oxygen Therapy: Patient Spontanous Breathing and Patient connected to face mask oxygen  Post-op Assessment: Report given to RN  Post vital signs: Reviewed and stable  Last Vitals:  Vitals:   02/18/16 1400 02/18/16 1405  BP: (!) 119/53 (!) 116/54  Pulse:    Resp: 14 13  Temp:      Last Pain:  Vitals:   02/18/16 1229  TempSrc: Oral  PainSc: 7       Patients Stated Pain Goal: 8 (02/18/16 1229)  Complications: No apparent anesthesia complications

## 2016-02-18 NOTE — Op Note (Signed)
02/18/2016  3:12 PM  PATIENT:  Lavella HammockSandra Marinaccio  65 y.o. female  PRE-OPERATIVE DIAGNOSIS:  right ankle fracture loss of fixation  POST-OPERATIVE DIAGNOSIS:  right ankle fracture loss of fixation  PROCEDURE:  Procedure(s) with comments: HARDWARE REMOVAL (Right) - right ankle medial malleolar screw OPEN REDUCTION INTERNAL FIXATION (ORIF) ANKLE FRACTURE (Right) - reinsertion of medial malleolar screw   Insertion of 4.5 cannulated Synthes screw after removal of 4.0 Stryker screw  Details of procedure  The patient was identified in the preop area the surgical site was marked. She was taken to the operating room given appropriate and antibiotics and had general anesthesia. We prepped and draped her foot sterilely into an completed the timeout procedure  The limb was exsanguinated with a 4 inch Esmarch. The tourniquet was elevated. 300 mmHg was the tourniquet pressure.  We made an incision using the previous medial malleolar fracture fixation incision. We divided that directly down to bone to create a full-thickness skin flaps proximally and distally anteriorly and posteriorly  The screw was easily visible was removed. We placed a bone clamp checked x-ray and then placed a K wire which was threaded for the 4.5 cannulated screws  We overdrilled only the proximal fragment and then placed a 50 mm 4.5 cannulated screw  This gave excellent reduction and visible compression  We tightened the anterior screw which was still intact  X-rays confirmed medial malleolar reduction and ankle mortise construct  Thorough irrigation was performed surgery so was placed for venous bleeding. We used #1 Vicryl suture and staples to close the wound. We injected 30 mL of Marcaine. We released the tourniquet and applied a posterior splint   SURGEON:  Surgeon(s) and Role:    * Vickki HearingStanley E Harrison, MD - Primary  PHYSICIAN ASSISTANT:   ASSISTANTS: none   ANESTHESIA:   general  EBL:  Total I/O In: 1000  [I.V.:1000] Out: 10 [Blood:10]  BLOOD ADMINISTERED:none  DRAINS: none   LOCAL MEDICATIONS USED:  MARCAINE     SPECIMEN:  No Specimen  DISPOSITION OF SPECIMEN:  N/A  COUNTS:  YES  TOURNIQUET:   Total Tourniquet Time Documented: Thigh (Right) - 27 minutes Total: Thigh (Right) - 27 minutes   DICTATION: .Reubin Milanragon Dictation  PLAN OF CARE: Discharge to home after PACU  PATIENT DISPOSITION:  PACU - hemodynamically stable.   Delay start of Pharmacological VTE agent (>24hrs) due to surgical blood loss or risk of bleeding: not applicable

## 2016-02-18 NOTE — Brief Op Note (Signed)
02/18/2016  3:12 PM  PATIENT:  [name]  65 y.o. female  PRE-OPERATIVE DIAGNOSIS:  right ankle fracture loss of fixation  POST-OPERATIVE DIAGNOSIS:  right ankle fracture loss of fixation  PROCEDURE:  Procedure(s) with comments: HARDWARE REMOVAL (Right) - right ankle medial malleolar screw OPEN REDUCTION INTERNAL FIXATION (ORIF) ANKLE FRACTURE (Right) - reinsertion of medial malleolar screw   Insertion of 4.5 cannulated Synthes screw after removal of 4.0 Stryker screw  Details of procedure  The patient was identified in the preop area the surgical site was marked. She was taken to the operating room given appropriate and antibiotics and had general anesthesia. We prepped and draped her foot sterilely into an completed the timeout procedure  The limb was exsanguinated with a 4 inch Esmarch. The tourniquet was elevated. 300 mmHg was the tourniquet pressure.  We made an incision using the previous medial malleolar fracture fixation incision. We divided that directly down to bone to create a full-thickness skin flaps proximally and distally anteriorly and posteriorly  The screw was easily visible was removed. We placed a bone clamp checked x-ray and then placed a K wire which was threaded for the 4.5 cannulated screws  We overdrilled only the proximal fragment and then placed a 50 mm 4.5 cannulated screw  This gave excellent reduction and visible compression  We tightened the anterior screw which was still intact  X-rays confirmed medial malleolar reduction and ankle mortise construct  Thorough irrigation was performed surgery so was placed for venous bleeding. We used #1 Vicryl suture and staples to close the wound. We injected 30 mL of Marcaine. We released the tourniquet and applied a posterior splint   SURGEON:  Surgeon(s) and Role:    * Stanley E Harrison, MD - Primary  PHYSICIAN ASSISTANT:   ASSISTANTS: none   ANESTHESIA:   general  EBL:  Total I/O In: 1000  [I.V.:1000] Out: 10 [Blood:10]  BLOOD ADMINISTERED:none  DRAINS: none   LOCAL MEDICATIONS USED:  MARCAINE     SPECIMEN:  No Specimen  DISPOSITION OF SPECIMEN:  N/A  COUNTS:  YES  TOURNIQUET:   Total Tourniquet Time Documented: Thigh (Right) - 27 minutes Total: Thigh (Right) - 27 minutes   DICTATION: .Dragon Dictation  PLAN OF CARE: Discharge to home after PACU  PATIENT DISPOSITION:  PACU - hemodynamically stable.   Delay start of Pharmacological VTE agent (>24hrs) due to surgical blood loss or risk of bleeding: not applicable  

## 2016-02-18 NOTE — H&P (Signed)
[name] is an 65 y.o. female.   Chief Complaint: One screw was coming out HPI: The patient is 65 years old she had open treatment internal fixation right ankle medial and lateral malleolar and in the postop period one of the screws backed out on the two-week x-ray. She presents for revision hardware. She has complaints of mild pain on the medial side which increased over the last 2-3 days  Otherwise no symptoms.  Past Medical History:  Diagnosis Date  . Anxiety   . Arthritis   . Borderline hypertension   . Chronic lower back pain   . Chronic shoulder pain   . Depression   . Fe deficiency anemia   . GERD (gastroesophageal reflux disease)   . Heartburn   . Hiatal hernia   . History of cardiac catheterization    Normal coronaries  . History of mixed drug abuse   . Hyperlipidemia   . Hypertension   . Hypothyroidism    [name]   . Lumbar herniated disc    L4-L5   . Microscopic hematuria   . Obesity   . Personal history of venous thrombosis and embolism   . PSVT (paroxysmal supraventricular tachycardia) (HCC) 8/05  . Recurrent cystitis    Dr. Aldean Ast   . Shortness of breath dyspnea     Past Surgical History:  Procedure Laterality Date  . APPENDECTOMY  2004?or 11/03?  [name] BACK SURGERY    . BLADDER REPAIR    . CARPAL TUNNEL RELEASE     x2   . LIVER BIOPSY  2002   Dr. Katrinka Blazing   . ORIF ANKLE FRACTURE Right 02/02/2016   Procedure: OPEN REDUCTION INTERNAL FIXATION (ORIF) ANKLE FRACTURE;  Surgeon: Vickki Hearing, MD;  Location: [facility];  Service: Orthopedics;  Laterality: Right;  . PARTIAL HYSTERECTOMY  2004   One ovary   . RECTAL PROLAPSE REPAIR    . RECTOCELE REPAIR  05/2001  . Right knee arthroscopy  8/05  . Right knee replacement  8/05  . SHOULDER SURGERY      Family History  Problem Relation Age of Onset  . Breast cancer Mother   . Breast cancer Sister   . Heart attack      Family history   . Coronary artery disease      Family History    Social  History:  reports that she has never smoked. She has never used smokeless tobacco. She reports that she does not drink alcohol or use drugs.  Allergies:  Allergies  Allergen Reactions  . Clindamycin     REACTION: caused C-diff per patient  . Diphenhydramine Hcl Other (See Comments)    REACTION:  Severely drowsy  . Naproxen     Burning in lower abdominal area.    Medications Prior to Admission  Medication Sig Dispense Refill  . AMITIZA 24 MCG capsule TAKE ONE CAPSULE BY MOUTH TWICE A DAY 60 capsule 4  . aspirin EC 81 MG tablet Take by mouth. Take 81 mg by mouth daily.    [name] atorvastatin (LIPITOR) 80 MG tablet TAKE ONE (1) TABLET EACH DAY 90 tablet 0  . Calcium Carbonate-Vitamin D (CALCIUM 500 + D) 500-125 MG-UNIT TABS Take by mouth. Take 1 tablet by mouth every morning.    . chlorthalidone (HYGROTON) 25 MG tablet TAKE ONE TABLET BY MOUTH TWICE DAILY 60 tablet 4  . Cholecalciferol (VITAMIN D-3 PO) Take 1 capsule by mouth every morning.     . clonazePAM (KLONOPIN) 2 MG  tablet TAKE 1 TABLET TWICE DAILY AS NEEDED FOR ANXIETY 60 tablet 1  . cyclobenzaprine (FLEXERIL) 10 MG tablet TAKE ONE TABLET BY MOUTH THREE TIMES DAILY AS NEEDED FOR MUSCLE SPASM 90 tablet 0  . ezetimibe-simvastatin (VYTORIN) 10-80 MG tablet Take 1 tablet by mouth daily.    . furosemide (LASIX) 20 MG tablet TAKE ONE (1) TABLET EACH DAY 30 tablet 2  . levothyroxine (SYNTHROID, LEVOTHROID) 75 MCG tablet TAKE ONE (1) TABLET EACH DAY 90 tablet 2  . metoprolol succinate (TOPROL XL) 50 MG 24 hr tablet Take 1 tablet (50 mg total) by mouth 2 (two) times daily. Take with or immediately following a meal. 180 tablet 3  . nitrofurantoin (MACRODANTIN) 100 MG capsule Take 100 mg by mouth at bedtime.    [name]. omega-3 acid ethyl esters (LOVAZA) 1 G capsule Take 1 g by mouth 2 (two) times daily.    [name]. omeprazole (PRILOSEC) 20 MG capsule TAKE ONE CAPSULE BY MOUTH TWICE A DAY 60 capsule 0  . Ospemifene (OSPHENA) 60 MG TABS Take 1 tablet by mouth  daily. Reported on 06/29/2015    . oxyCODONE-acetaminophen (PERCOCET) 7.5-325 MG tablet Take 1 tablet by mouth every 4 (four) hours as needed for severe pain. 42 tablet 0  . potassium chloride SA (K-DUR,KLOR-CON) 20 MEQ tablet TAKE TWO TABLETS BY MOUTH TWICE DAILY 120 tablet 4  . PREMARIN vaginal cream APPLY ONCE WEEKLY 30 g 1  . RESTASIS 0.05 % ophthalmic emulsion INSTILL ONE DROP IN BOTH EYES TWICE DAILY 60 each 11  . triamterene-hydrochlorothiazide (MAXZIDE-25) 37.5-25 MG tablet Take 1 tablet by mouth daily.    [name]. venlafaxine XR (EFFEXOR-XR) 75 MG 24 hr capsule TAKE 3 CAPSULES EVERY MORNING 90 capsule 0    No results found for this or any previous visit (from the past 48 hour(s)). Dg Ankle Complete Right  Result Date: 02/16/2016 3 views right ankle Lateral plate and screw construct looks normal One of the medial malleolar screws is backing out There is no displacement of the medial malleolus Ankle mortise is still intact No change in position of the hardware on the lateral malleolus but the medial malleolar screw one of them is backing out   Review of Systems  Respiratory: Negative for shortness of breath.   Cardiovascular: Negative for chest pain.  Musculoskeletal: Positive for joint pain.  Neurological: Tremors: general appearance she has obesity otherwise normal development grooming and hygiene.  All other systems reviewed and are negative.  BP (!) 125/56   Pulse 66   Temp 97.8 F (36.6 C) (Oral)   Resp 15   Ht 5\' 6"  (1.676 m)   Wt 225 lb (102.1 kg)   SpO2 100%   BMI 36.32 kg/m   There were no vitals taken for this visit. Physical Exam  Appearance obesity Grooming hygiene normal She is oriented to person place and time Mood and affect are pleasant Gait supported by a walker nonweightbearing  The ankle is swollen and tender on the medial and lateral side scans intact incisions healed nicely the skin is dry Ankle motion is 10 total Ankle stability is normal Muscle tone  is normal no atrophy Skin as described Dorsalis pedis pulses 1+ posterior tib 1+ mild swelling and edema in the foot Lymph nodes on this side are normal She has adequate balance and coordination Assessment/Plan Bimalleolar fracture right ankle with internal fixation. One of the screws is loose  Plan is to remove the medial screw of the right ankle and replace it  with a larger diameter screw  [name], MD 02/18/2016, 12:23 PM

## 2016-02-18 NOTE — Interval H&P Note (Signed)
History and Physical Interval Note:  02/18/2016 2:07 PM  [name] HammockSandra Timmers  has presented today for surgery, with the diagnosis of right ankle fracture loss of fixation  The various methods of treatment have been discussed with the patient and family. After consideration of risks, benefits and other options for treatment, the patient has consented to  Procedure(s) with comments: HARDWARE REMOVAL (Right) - right ankle medial malleolar screw OPEN REDUCTION INTERNAL FIXATION (ORIF) ANKLE FRACTURE (Right) - reinsertion of medial malleolar screw as a surgical intervention .  The patient's history has been reviewed, patient examined, no change in status, stable for surgery.  I have reviewed the patient's chart and labs.  Questions were answered to the patient's satisfaction.     Fuller CanadaStanley Harrison

## 2016-02-18 NOTE — Anesthesia Postprocedure Evaluation (Signed)
Anesthesia Post Note  Patient: [name] HammockSandra [name]  Procedure(s) Performed: Procedure(s) (LRB): HARDWARE REMOVAL (Right) OPEN REDUCTION INTERNAL FIXATION (ORIF) ANKLE FRACTURE (Right)  Patient location during evaluation: PACU Anesthesia Type: General Level of consciousness: awake and alert Pain management: pain level controlled Vital Signs Assessment: post-procedure vital signs reviewed and stable Respiratory status: spontaneous breathing Cardiovascular status: blood pressure returned to baseline Postop Assessment: no signs of nausea or vomiting Anesthetic complications: no Comments: Late entry    Last Vitals:  Vitals:   02/18/16 1600 02/18/16 1624  BP: 135/75 (!) 145/65  Pulse: 96 98  Resp: 17 18  Temp:  36.6 C    Last Pain:  Vitals:   02/18/16 1624  TempSrc: Oral  PainSc: 5                  Harmony Sandell

## 2016-02-23 ENCOUNTER — Encounter (HOSPITAL_COMMUNITY): Payer: Self-pay | Admitting: Orthopedic Surgery

## 2016-02-25 ENCOUNTER — Encounter: Payer: Self-pay | Admitting: Orthopedic Surgery

## 2016-02-25 ENCOUNTER — Ambulatory Visit: Payer: Medicaid Other | Admitting: Orthopedic Surgery

## 2016-02-25 VITALS — BP 103/66 | HR 72 | Ht 66.0 in | Wt 225.0 lb

## 2016-02-25 DIAGNOSIS — Z4789 Encounter for other orthopedic aftercare: Secondary | ICD-10-CM

## 2016-02-25 DIAGNOSIS — S82851D Displaced trimalleolar fracture of right lower leg, subsequent encounter for closed fracture with routine healing: Secondary | ICD-10-CM

## 2016-02-25 MED ORDER — OXYCODONE-ACETAMINOPHEN 5-325 MG PO TABS: 1.0000 | ORAL_TABLET | Freq: Four times a day (QID) | ORAL | 0 refills | Status: DC | PRN

## 2016-02-25 NOTE — Progress Notes (Signed)
Postop visit status post open treatment internal fixation right ankle on 823 she had to have revision of medial screw on September 8 she's here for the one-week follow-up after that and the three-week follow-up after original surgery  Her wound looks good  We will place her in a Cam Walker short because of the circumference of the proximal part of her tibial area  She will continue the nonweightbearing she can start ankle exercises twice a day  Follow-up in one week for x-ray ankle  Refill Percocet 5 mg every 6 hours #30

## 2016-03-01 ENCOUNTER — Other Ambulatory Visit: Payer: Medicaid Other

## 2016-03-01 ENCOUNTER — Telehealth: Payer: Self-pay | Admitting: Family Medicine

## 2016-03-01 DIAGNOSIS — R300 Dysuria: Secondary | ICD-10-CM

## 2016-03-01 DIAGNOSIS — R350 Frequency of micturition: Secondary | ICD-10-CM

## 2016-03-01 NOTE — Telephone Encounter (Signed)
Spoke with pt regarding sxs Pt will bring in urine specimen UA and culture per Dr Darlyn ReadStacks

## 2016-03-02 ENCOUNTER — Other Ambulatory Visit: Payer: Self-pay | Admitting: Family Medicine

## 2016-03-02 ENCOUNTER — Telehealth: Payer: Self-pay

## 2016-03-02 LAB — MICROSCOPIC EXAMINATION

## 2016-03-02 LAB — URINALYSIS, COMPLETE
Bilirubin, UA: NEGATIVE
Ketones, UA: NEGATIVE
Nitrite, UA: POSITIVE — AB
PROTEIN UA: NEGATIVE
Specific Gravity, UA: 1.015 (ref 1.005–1.030)
Urobilinogen, Ur: 2 mg/dL — ABNORMAL HIGH (ref 0.2–1.0)
pH, UA: 7 (ref 5.0–7.5)

## 2016-03-02 MED ORDER — SULFAMETHOXAZOLE-TRIMETHOPRIM 800-160 MG PO TABS: 1.0000 | ORAL_TABLET | Freq: Two times a day (BID) | ORAL | 0 refills | Status: DC

## 2016-03-02 NOTE — Telephone Encounter (Signed)
Patient's urine indicated UTI.  Dr. Louanne Skyeettinger called in Bactrim.  Patient was informed.

## 2016-03-02 NOTE — Progress Notes (Signed)
Spoke with pt regarding RX for UTI Pt verbalizes understanding

## 2016-03-03 ENCOUNTER — Other Ambulatory Visit: Payer: Self-pay | Admitting: Family Medicine

## 2016-03-03 ENCOUNTER — Ambulatory Visit (INDEPENDENT_AMBULATORY_CARE_PROVIDER_SITE_OTHER): Payer: Medicaid Other

## 2016-03-03 ENCOUNTER — Ambulatory Visit: Payer: Medicaid Other | Admitting: Orthopedic Surgery

## 2016-03-03 VITALS — BP 167/107 | HR 67 | Ht 66.0 in

## 2016-03-03 DIAGNOSIS — S82891A Other fracture of right lower leg, initial encounter for closed fracture: Secondary | ICD-10-CM | POA: Diagnosis not present

## 2016-03-03 DIAGNOSIS — S82841G Displaced bimalleolar fracture of right lower leg, subsequent encounter for closed fracture with delayed healing: Secondary | ICD-10-CM

## 2016-03-03 DIAGNOSIS — Z4789 Encounter for other orthopedic aftercare: Secondary | ICD-10-CM

## 2016-03-03 MED ORDER — CIPROFLOXACIN HCL 500 MG PO TABS: 500.0000 mg | ORAL_TABLET | Freq: Two times a day (BID) | ORAL | 0 refills | Status: DC

## 2016-03-03 MED ORDER — HYDROCODONE-ACETAMINOPHEN 10-325 MG PO TABS: 1.0000 | ORAL_TABLET | Freq: Four times a day (QID) | ORAL | 0 refills | Status: DC | PRN

## 2016-03-03 NOTE — Progress Notes (Signed)
Chief Complaint  Patient presents with  . Follow-up    RT ANKLE ORIF, DOS 02/18/16     Right ankle open reduction internal fixation initial procedure was August 23  Hardware removal and reinsertion of larger malleolar screw on September 8  Today's x-ray shows the screw is still backing out there is ostial lysis around the screw  Recommend removal of the screw and if the bone is stable which it doesn't show any displacement today then keep the screw out and placed the patient in a cast  X-rays reviewed with patient treatment plan reviewed as well

## 2016-03-03 NOTE — Patient Instructions (Addendum)
Surgical removal of hardware 9/27

## 2016-03-03 NOTE — Addendum Note (Signed)
Addended by: Adella HareBOOTHE, Kyjuan Gause B on: 03/03/2016 12:15 PM   Modules accepted: Orders, SmartSet

## 2016-03-06 LAB — URINE CULTURE

## 2016-03-06 NOTE — Patient Instructions (Signed)
[name]  03/06/2016     @PREFPERIOPPHARMACY @   Your procedure is scheduled on  03/08/2016  Report to [facility] at  1100  A.M.  Call this number if you have problems the morning of surgery:  [phone]   Remember:  Do not eat food or drink liquids after midnight.  Take these medicines the morning of surgery with A SIP OF WATER  Hygroton, klonopin, flexaril, norco, levothyroxine, metprolol, prilosec, oxycodone, effexor, maxzide.   Do not wear jewelry, make-up or nail polish.  Do not wear lotions, powders, or perfumes, or deoderant.  Do not shave 48 hours prior to surgery.  Men may shave face and neck.  Do not bring valuables to the hospital.  [facility] is not responsible for any belongings or valuables.  Contacts, dentures or bridgework may not be worn into surgery.  Leave your suitcase in the car.  After surgery it may be brought to your room.  For patients admitted to the hospital, discharge time will be determined by your treatment team.  Patients discharged the day of surgery will not be allowed to drive home.   Name and phone number of your driver:   family Special instructions:  none  Please read over the following fact sheets that you were given. Anesthesia Post-op Instructions and Care and Recovery After Surgery      Hardware Removal Hardware removal is a procedure to remove from the body any medical parts that were used to repair a broken bone, such as pins, screws, rods, wires, and plates. This procedure may be done to:  Remove medical parts that are normally removed after a broken bone has healed.  Remove medical parts that are causing problems, such as infection or pain.  Remove medical parts that are not working.  Replace medical parts with newer, better materials. LET [facility] CARE PROVIDER KNOW ABOUT:  Any allergies you have.  All medicines you are taking, including vitamins, herbs, eye drops, creams, and over-the-counter  medicines. This includes any use of steroids, either by mouth or in cream form.  Previous problems you or members of your family have had with the use of anesthetics.  Any blood disorders you have.  Previous surgeries you have had.  Any medical conditions you may have.  Possibility of pregnancy, if this applies. RISKS AND COMPLICATIONS Generally, this is a safe procedure. However, problems may occur, including:  Infection.  Bleeding.  Pain.  The bone breaking again (refracture).  A failure to completely remove the medical parts. BEFORE THE PROCEDURE  Follow instructions from your health care provider about eating or drinking restrictions.  Ask your health care provider about:  Changing or stopping your regular medicines. This is especially important if you are taking diabetes medicines or blood thinners.  Taking medicines such as aspirin and ibuprofen. These medicines can thin your blood. Do not take these medicines before your procedure if your health care provider instructs you not to.  Plan to have someone take you home after the procedure.  If you go home right after the procedure, plan to have someone with you for 24 hours. PROCEDURE  You will lie on an exam table.  Several monitors will be connected to you to keep track of your heart rate, blood pressure, and breathing during the procedure.  An IV tube will be inserted into one of your veins.  You will be given one or more of the following:  A medicine  that helps you relax (sedative).  A medicine that numbs the area (local anesthetic).  A medicine that makes you fall asleep (general anesthetic).  A medicine that is injected into your spine that numbs the area below and slightly above the injection site (spinal anesthetic).  X-rays may be taken.  The surgeon will make an incision over the area where the medical parts are located.  The medical parts will be removed.  The incision will be closed with  stitches (sutures), staples, or surgical glue.  A bandage (dressing) will be placed over the incision site to keep it clean and dry.  A splint, cast, or removable walking boot may be applied until the area heals. The procedure may vary among health care providers and hospitals. AFTER THE PROCEDURE  Your blood pressure, heart rate, breathing rate, and blood oxygen level will be monitored often until the medicines you were given have worn off.  You will stay in the recovery room until you are awake and able to drink fluids.  You may be sleepy and nauseous, and you may feel some pain.   This information is not intended to replace advice given to you by your health care provider. Make sure you discuss any questions you have with your health care provider.   Document Released: 03/26/2009 Document Revised: 10/13/2014 Document Reviewed: 05/25/2014 Elsevier Interactive Patient Education 2016 [employer] Hardware Removal, Care After Refer to this sheet in the next few weeks. These instructions provide you with information about caring for yourself after your procedure. Your health care provider may also give you more specific instructions. Your treatment has been planned according to current medical practices, but problems sometimes occur. Call your health care provider if you have any problems or questions after your procedure. WHAT TO EXPECT AFTER THE PROCEDURE After your procedure, it is common to have:  Some pain.  Nausea.  Some swelling in the area where hardware was removed. HOME CARE INSTRUCTIONS  Take medicines only as directed by your health care provider.  Follow instructions from your health care provider about:  Rest.  Physical activity.  Bearing weight. SEEK MEDICAL CARE IF:  You have lasting pain.  You are unable to perform exercises or physical activity as directed by your health care provider. SEEK IMMEDIATE MEDICAL CARE IF:  You have severe pain.  You have a  fever or chills.  You have redness, heat, swelling, or pain at the site of your incision.  You have fluid, blood, or pus coming from your incision.  You have difficulty breathing.  You cannot pass gas.  You are unable to have a bowel movement within 2 days.  You have numbness for more than 24 hours in the area where hardware was removed.   This information is not intended to replace advice given to you by your health care provider. Make sure you discuss any questions you have with your health care provider.   Document Released: 10/13/2014 Document Reviewed: 10/13/2014 Elsevier Interactive Patient Education 2016 [employer] Inc[employer] PATIENT INSTRUCTIONS POST-ANESTHESIA  IMMEDIATELY FOLLOWING SURGERY:  Do not drive or operate machinery for the first twenty four hours after surgery.  Do not make any important decisions for twenty four hours after surgery or while taking narcotic pain medications or sedatives.  If you develop intractable nausea and vomiting or a severe headache please notify your doctor immediately.  FOLLOW-UP:  Please make an appointment with your surgeon as instructed. You do not need to follow up with anesthesia unless specifically instructed  to do so.  WOUND CARE INSTRUCTIONS (if applicable):  Keep a dry clean dressing on the anesthesia/puncture wound site if there is drainage.  Once the wound has quit draining you may leave it open to air.  Generally you should leave the bandage intact for twenty four hours unless there is drainage.  If the epidural site drains for more than 36-48 hours please call the anesthesia department.  QUESTIONS?:  Please feel free to call your physician or the hospital operator if you have any questions, and they will be happy to assist you.

## 2016-03-07 ENCOUNTER — Encounter (HOSPITAL_COMMUNITY): Payer: Self-pay

## 2016-03-07 ENCOUNTER — Encounter (HOSPITAL_COMMUNITY)
Admission: RE | Admit: 2016-03-07 | Discharge: 2016-03-07 | Disposition: A | Discharge status: Home / Self Care | Payer: Medicaid Other | Source: Ambulatory Visit | Attending: Orthopedic Surgery | Admitting: Orthopedic Surgery

## 2016-03-07 ENCOUNTER — Other Ambulatory Visit: Payer: Self-pay | Admitting: Family Medicine

## 2016-03-08 ENCOUNTER — Encounter (HOSPITAL_COMMUNITY)
Admission: RE | Disposition: A | Discharge status: Home / Self Care | Payer: Self-pay | Source: Ambulatory Visit | Attending: Orthopedic Surgery

## 2016-03-08 ENCOUNTER — Encounter (HOSPITAL_COMMUNITY): Payer: Self-pay | Admitting: *Deleted

## 2016-03-08 ENCOUNTER — Ambulatory Visit (HOSPITAL_COMMUNITY): Payer: Medicaid Other | Admitting: Anesthesiology

## 2016-03-08 ENCOUNTER — Ambulatory Visit (HOSPITAL_COMMUNITY)
Admission: RE | Admit: 2016-03-08 | Discharge: 2016-03-08 | Disposition: A | Discharge status: Home / Self Care | Payer: Medicaid Other | Source: Ambulatory Visit | Attending: Orthopedic Surgery | Admitting: Orthopedic Surgery

## 2016-03-08 DIAGNOSIS — K219 Gastro-esophageal reflux disease without esophagitis: Secondary | ICD-10-CM | POA: Insufficient documentation

## 2016-03-08 DIAGNOSIS — I471 Supraventricular tachycardia: Secondary | ICD-10-CM | POA: Insufficient documentation

## 2016-03-08 DIAGNOSIS — I10 Essential (primary) hypertension: Secondary | ICD-10-CM | POA: Insufficient documentation

## 2016-03-08 DIAGNOSIS — D509 Iron deficiency anemia, unspecified: Secondary | ICD-10-CM | POA: Insufficient documentation

## 2016-03-08 DIAGNOSIS — E039 Hypothyroidism, unspecified: Secondary | ICD-10-CM | POA: Diagnosis not present

## 2016-03-08 DIAGNOSIS — Z6836 Body mass index (BMI) 36.0-36.9, adult: Secondary | ICD-10-CM | POA: Insufficient documentation

## 2016-03-08 DIAGNOSIS — Y793 Surgical instruments, materials and orthopedic devices (including sutures) associated with adverse incidents: Secondary | ICD-10-CM | POA: Diagnosis not present

## 2016-03-08 DIAGNOSIS — F419 Anxiety disorder, unspecified: Secondary | ICD-10-CM | POA: Insufficient documentation

## 2016-03-08 DIAGNOSIS — E669 Obesity, unspecified: Secondary | ICD-10-CM | POA: Diagnosis not present

## 2016-03-08 DIAGNOSIS — K449 Diaphragmatic hernia without obstruction or gangrene: Secondary | ICD-10-CM | POA: Insufficient documentation

## 2016-03-08 DIAGNOSIS — Z86718 Personal history of other venous thrombosis and embolism: Secondary | ICD-10-CM | POA: Insufficient documentation

## 2016-03-08 DIAGNOSIS — T8489XA Other specified complication of internal orthopedic prosthetic devices, implants and grafts, initial encounter: Secondary | ICD-10-CM | POA: Insufficient documentation

## 2016-03-08 DIAGNOSIS — Z886 Allergy status to analgesic agent status: Secondary | ICD-10-CM | POA: Insufficient documentation

## 2016-03-08 DIAGNOSIS — F329 Major depressive disorder, single episode, unspecified: Secondary | ICD-10-CM | POA: Diagnosis not present

## 2016-03-08 DIAGNOSIS — Z881 Allergy status to other antibiotic agents status: Secondary | ICD-10-CM | POA: Diagnosis not present

## 2016-03-08 DIAGNOSIS — S82841G Displaced bimalleolar fracture of right lower leg, subsequent encounter for closed fracture with delayed healing: Secondary | ICD-10-CM | POA: Diagnosis not present

## 2016-03-08 DIAGNOSIS — X58XXXD Exposure to other specified factors, subsequent encounter: Secondary | ICD-10-CM | POA: Insufficient documentation

## 2016-03-08 DIAGNOSIS — E785 Hyperlipidemia, unspecified: Secondary | ICD-10-CM | POA: Insufficient documentation

## 2016-03-08 DIAGNOSIS — S82843A Displaced bimalleolar fracture of unspecified lower leg, initial encounter for closed fracture: Secondary | ICD-10-CM

## 2016-03-08 DIAGNOSIS — M1990 Unspecified osteoarthritis, unspecified site: Secondary | ICD-10-CM | POA: Diagnosis not present

## 2016-03-08 DIAGNOSIS — D649 Anemia, unspecified: Secondary | ICD-10-CM | POA: Diagnosis not present

## 2016-03-08 DIAGNOSIS — Z4789 Encounter for other orthopedic aftercare: Secondary | ICD-10-CM

## 2016-03-08 HISTORY — PX: HARDWARE REMOVAL: SHX979

## 2016-03-08 SURGERY — REMOVAL, HARDWARE
Anesthesia: General | Site: Ankle | Laterality: Right

## 2016-03-08 MED ORDER — OXYCODONE-ACETAMINOPHEN 5-325 MG PO TABS: 1.0000 | ORAL_TABLET | ORAL | 0 refills | Status: DC | PRN

## 2016-03-08 MED ORDER — HYDROMORPHONE HCL 1 MG/ML IJ SOLN
0.2500 mg | INTRAMUSCULAR | Status: DC | PRN
  Administered 2016-03-08: 0.5 mg via INTRAVENOUS
  Filled 2016-03-08: qty 1

## 2016-03-08 MED ORDER — BUPIVACAINE-EPINEPHRINE (PF) 0.5% -1:200000 IJ SOLN
INTRAMUSCULAR | Status: AC
  Filled 2016-03-08: qty 30

## 2016-03-08 MED ORDER — HYDROCORTISONE 1 % EX LOTN
1.0000 | TOPICAL_LOTION | Freq: Two times a day (BID) | CUTANEOUS | 0 refills | Status: DC
Start: 2016-03-08 — End: 2016-10-10

## 2016-03-08 MED ORDER — MIDAZOLAM HCL 2 MG/2ML IJ SOLN
1.0000 mg | INTRAMUSCULAR | Status: DC | PRN
  Administered 2016-03-08: 2 mg via INTRAVENOUS
  Filled 2016-03-08: qty 2

## 2016-03-08 MED ORDER — FENTANYL CITRATE (PF) 250 MCG/5ML IJ SOLN
INTRAMUSCULAR | Status: AC
  Filled 2016-03-08: qty 5

## 2016-03-08 MED ORDER — MEPERIDINE HCL 50 MG/ML IJ SOLN
12.5000 mg | Freq: Once | INTRAMUSCULAR | Status: AC
  Administered 2016-03-08: 12.5 mg via INTRAVENOUS

## 2016-03-08 MED ORDER — SODIUM CHLORIDE 0.9 % IR SOLN
Status: DC | PRN
  Administered 2016-03-08: 1000 mL

## 2016-03-08 MED ORDER — ONDANSETRON HCL 4 MG/2ML IJ SOLN
4.0000 mg | Freq: Once | INTRAMUSCULAR | Status: AC
  Administered 2016-03-08: 4 mg via INTRAVENOUS
  Filled 2016-03-08: qty 2

## 2016-03-08 MED ORDER — LACTATED RINGERS IV SOLN
INTRAVENOUS | Status: DC
  Administered 2016-03-08: 12:00:00 via INTRAVENOUS

## 2016-03-08 MED ORDER — CEFAZOLIN SODIUM-DEXTROSE 2-4 GM/100ML-% IV SOLN
2.0000 g | INTRAVENOUS | Status: AC
  Administered 2016-03-08: 2 g via INTRAVENOUS
  Filled 2016-03-08: qty 100

## 2016-03-08 MED ORDER — PROPOFOL 10 MG/ML IV BOLUS
INTRAVENOUS | Status: DC | PRN
  Administered 2016-03-08: 175 mg via INTRAVENOUS

## 2016-03-08 MED ORDER — FENTANYL CITRATE (PF) 100 MCG/2ML IJ SOLN
25.0000 ug | INTRAMUSCULAR | Status: AC | PRN
  Administered 2016-03-08 (×2): 25 ug via INTRAVENOUS
  Filled 2016-03-08: qty 2

## 2016-03-08 MED ORDER — CHLORHEXIDINE GLUCONATE 4 % EX LIQD: 60.0000 mL | Freq: Once | CUTANEOUS | Status: DC

## 2016-03-08 MED ORDER — LIDOCAINE HCL 2 % EX GEL
CUTANEOUS | Status: DC | PRN
  Administered 2016-03-08: 50 via TOPICAL

## 2016-03-08 MED ORDER — FENTANYL CITRATE (PF) 100 MCG/2ML IJ SOLN
INTRAMUSCULAR | Status: DC | PRN
  Administered 2016-03-08 (×4): 50 ug via INTRAVENOUS

## 2016-03-08 MED ORDER — BUPIVACAINE-EPINEPHRINE 0.5% -1:200000 IJ SOLN
INTRAMUSCULAR | Status: DC | PRN
  Administered 2016-03-08: 30 mL

## 2016-03-08 MED ORDER — MEPERIDINE HCL 50 MG/ML IJ SOLN
INTRAMUSCULAR | Status: AC
  Filled 2016-03-08: qty 1

## 2016-03-08 SURGICAL SUPPLY — 64 items
.045 K-wire ×1 IMPLANT
.045 KWIRE ×1 IMPLANT
.045 in K-wire ×1 IMPLANT
BAG HAMPER (MISCELLANEOUS) ×2 IMPLANT
BANDAGE ELASTIC 3 LF NS (GAUZE/BANDAGES/DRESSINGS) ×1 IMPLANT
BANDAGE ELASTIC 4 LF NS (GAUZE/BANDAGES/DRESSINGS) ×2 IMPLANT
BANDAGE ELASTIC 6 VELCRO NS (GAUZE/BANDAGES/DRESSINGS) IMPLANT
BANDAGE ESMARK 4X12 BL STRL LF (DISPOSABLE) ×1 IMPLANT
BLADE SURG SZ10 CARB STEEL (BLADE) ×2 IMPLANT
BNDG CMPR 12X4 ELC STRL LF (DISPOSABLE) ×1
BNDG CMPR MED 5X3 ELC HKLP NS (GAUZE/BANDAGES/DRESSINGS) ×1
BNDG CMPR MED 5X4 ELC HKLP NS (GAUZE/BANDAGES/DRESSINGS) ×2
BNDG COHESIVE 4X5 TAN STRL (GAUZE/BANDAGES/DRESSINGS) ×1 IMPLANT
BNDG ESMARK 4X12 BLUE STRL LF (DISPOSABLE) ×2
CHLORAPREP W/TINT 26ML (MISCELLANEOUS) ×2 IMPLANT
CLOTH BEACON ORANGE TIMEOUT ST (SAFETY) ×2 IMPLANT
COVER LIGHT HANDLE STERIS (MISCELLANEOUS) ×4 IMPLANT
CUFF TOURNIQUET SINGLE 34IN LL (TOURNIQUET CUFF) ×2 IMPLANT
DECANTER SPIKE VIAL GLASS SM (MISCELLANEOUS) ×2 IMPLANT
DRAPE C-ARM FOLDED MOBILE STRL (DRAPES) IMPLANT
DRAPE OEC MINIVIEW 54X84 (DRAPES) ×1 IMPLANT
ELECT REM PT RETURN 9FT ADLT (ELECTROSURGICAL) ×2
ELECTRODE REM PT RTRN 9FT ADLT (ELECTROSURGICAL) ×1 IMPLANT
GAUZE SPONGE 4X4 12PLY STRL (GAUZE/BANDAGES/DRESSINGS) ×1 IMPLANT
GAUZE XEROFORM 5X9 LF (GAUZE/BANDAGES/DRESSINGS) ×1 IMPLANT
GLOVE BIOGEL PI IND STRL 7.0 (GLOVE) ×1 IMPLANT
GLOVE BIOGEL PI INDICATOR 7.0 (GLOVE) ×1
GLOVE SKINSENSE NS SZ8.0 LF (GLOVE) ×1
GLOVE SKINSENSE STRL SZ8.0 LF (GLOVE) ×1 IMPLANT
GLOVE SS N UNI LF 8.5 STRL (GLOVE) ×2 IMPLANT
GOWN STRL REUS W/TWL LRG LVL3 (GOWN DISPOSABLE) ×2 IMPLANT
GOWN STRL REUS W/TWL XL LVL3 (GOWN DISPOSABLE) ×2 IMPLANT
INST SET MINOR BONE (KITS) ×2 IMPLANT
KIT ROOM TURNOVER APOR (KITS) ×2 IMPLANT
KWIRE SGL PT/SMTH 9X45IN (WIRE) ×1 IMPLANT
MANIFOLD NEPTUNE II (INSTRUMENTS) ×2 IMPLANT
NDL HYPO 21X1 ECLIPSE (NEEDLE) ×1 IMPLANT
NDL HYPO 21X1.5 SAFETY (NEEDLE) ×1 IMPLANT
NEEDLE HYPO 21X1 ECLIPSE (NEEDLE) ×2 IMPLANT
NEEDLE HYPO 21X1.5 SAFETY (NEEDLE) ×2 IMPLANT
NS IRRIG 1000ML POUR BTL (IV SOLUTION) ×2 IMPLANT
PACK BASIC LIMB (CUSTOM PROCEDURE TRAY) ×2 IMPLANT
PAD ABD 5X9 TENDERSORB (GAUZE/BANDAGES/DRESSINGS) ×1 IMPLANT
PAD ARMBOARD 7.5X6 YLW CONV (MISCELLANEOUS) ×2 IMPLANT
PAD CAST 4YDX4 CTTN HI CHSV (CAST SUPPLIES) ×1 IMPLANT
PADDING CAST COTTON 4X4 STRL (CAST SUPPLIES) ×2
PADDING WEBRIL 4 STERILE (GAUZE/BANDAGES/DRESSINGS) ×2 IMPLANT
SET BASIN LINEN APH (SET/KITS/TRAYS/PACK) ×2 IMPLANT
SPLINT J IMMOBILIZER 4X20FT (CAST SUPPLIES) IMPLANT
SPLINT J PLASTER J 4INX20Y (CAST SUPPLIES) ×1
SPONGE LAP 18X18 X RAY DECT (DISPOSABLE) ×2 IMPLANT
SPONGE LAP 4X18 X RAY DECT (DISPOSABLE) ×2 IMPLANT
STAPLER VISISTAT 35W (STAPLE) ×1 IMPLANT
SUCTION YANKAUER HANDLE (MISCELLANEOUS) ×1 IMPLANT
SUT ETHILON 3 0 FSL (SUTURE) ×2 IMPLANT
SUT MON AB 0 CT1 (SUTURE) ×1 IMPLANT
SUT MON AB 2-0 CT1 36 (SUTURE) ×2 IMPLANT
SUT PROLENE 4 0 PS 2 18 (SUTURE) IMPLANT
SUT VIC AB 1 CT1 27 (SUTURE) ×2
SUT VIC AB 1 CT1 27XBRD ANTBC (SUTURE) IMPLANT
SWAB CULTURE ESWAB REG 1ML (MISCELLANEOUS) ×1 IMPLANT
SWAB CULTURE LIQ STUART DBL (MISCELLANEOUS) ×1 IMPLANT
SYR 30ML LL (SYRINGE) ×2 IMPLANT
SYR BULB IRRIGATION 50ML (SYRINGE) ×4 IMPLANT

## 2016-03-08 NOTE — Interval H&P Note (Signed)
History and Physical Interval Note:  03/08/2016 12:19 PM  [name]  has presented today for surgery, with the diagnosis of RIGHT BIMALLEOLAR ANKLE FRACTURE, SCREW FAILURE  The various methods of treatment have been discussed with the patient and family. After consideration of risks, benefits and other options for treatment, the patient has consented to  Procedure(s) with comments: HARDWARE REMOVAL (Right) - RIGHT ANKLE REMOVAL OF MALLEOLAR SCREW as a surgical intervention .  The patient's history has been reviewed, patient examined, no change in status, stable for surgery.  I have reviewed the patient's chart and labs.  Questions were answered to the patient's satisfaction.     Fuller CanadaStanley Shreena Baines

## 2016-03-08 NOTE — Anesthesia Preprocedure Evaluation (Signed)
Anesthesia Evaluation  Patient identified by MRN, date of birth, ID band Patient awake    Reviewed: Allergy & Precautions, NPO status , Patient's Chart, lab work & pertinent test results, reviewed documented beta blocker date and time   Airway Mallampati: III  TM Distance: >3 FB Neck ROM: Full    Dental no notable dental hx. (+) Teeth Intact   Pulmonary shortness of breath and with exertion,    Pulmonary exam normal breath sounds clear to auscultation       Cardiovascular hypertension, Pt. on medications and Pt. on home beta blockers Normal cardiovascular exam+ dysrhythmias Supra Ventricular Tachycardia  Rhythm:Regular Rate:Normal     Neuro/Psych PSYCHIATRIC DISORDERS Anxiety Depression  Neuromuscular disease    GI/Hepatic Neg liver ROS, hiatal hernia, GERD  Medicated and Controlled,  Endo/Other  Hypothyroidism Obesity Hyperlipidemia  Renal/GU negative Renal ROS  negative genitourinary   Musculoskeletal  (+) Arthritis , Trimalleolar Fx Right ankle   Abdominal (+) + obese,   Peds  Hematology  (+) anemia ,   Anesthesia Other Findings   Reproductive/Obstetrics                             Anesthesia Physical Anesthesia Plan  ASA: II  Anesthesia Plan: General   Post-op Pain Management:    Induction: Intravenous  Airway Management Planned: LMA  Additional Equipment:   Intra-op Plan:   Post-operative Plan: Extubation in OR  Informed Consent: I have reviewed the patients History and Physical, chart, labs and discussed the procedure including the risks, benefits and alternatives for the proposed anesthesia with the patient or authorized representative who has indicated his/her understanding and acceptance.   Dental advisory given  Plan Discussed with: CRNA, Anesthesiologist and Surgeon  Anesthesia Plan Comments:         Anesthesia Quick Evaluation  

## 2016-03-08 NOTE — Op Note (Signed)
03/08/2016  1:22 PM  PATIENT:  [name]  65 y.o. female  PRE-OPERATIVE DIAGNOSIS:  RIGHT BIMALLEOLAR ANKLE FRACTURE, SCREW FAILURE  POST-OPERATIVE DIAGNOSIS:  RIGHT BIMALLEOLAR ANKLE FRACTURE, SCREW FAILURE  PROCEDURE:  Procedure(s) with comments: HARDWARE REMOVAL AND INTERNAL FIXATION OF medial MALLEOLUS (Right) - RIGHT ANKLE REMOVAL OF MALLEOLAR SCREW, INSERTION OF 2  0.45 K WIRES  SURGEON:  Surgeon(s) and Role:    * Vickki HearingStanley E Harrison, MD - Primary  PHYSICIAN ASSISTANT:   ASSISTANTS: [place] NationBetty Ashley   ANESTHESIA:   general  EBL:  Total I/O In: -  Out: 10 [Blood:10]  BLOOD ADMINISTERED:none  DRAINS: none   LOCAL MEDICATIONS USED:  MARCAINE     SPECIMEN:  Source of Specimen:  Anaerobic and aerobic culture  DISPOSITION OF SPECIMEN:  Microbiology  COUNTS:  YES  TOURNIQUET:    DICTATION: .Dragon Dictation  PLAN OF CARE: Discharge to home after PACU  PATIENT DISPOSITION:  PACU - hemodynamically stable.   Delay start of Pharmacological VTE agent (>24hrs) due to surgical blood loss or risk of bleeding: not applicable  Details of surgery  Indications the patient had open treatment internal fixation of the right ankle. Did well x-rays look good for the first few weeks then we noticed that the cannulated screw is backing out so we exchanged for larger screw with a wider pitch on the threads however in the postop period that screw became loose  We took her back to surgery today to remove that screw examine the fracture for infection with cultures and also examine the fracture for union and if not insertion of alternate fixation  We examined her in the preop area her wound was okay for surgery. We marked the surgical site and updated the chart  We took her to the operating room, she had general anesthesia with an LMA. She was in the supine position.  After sterile prep and drape and timeout the tourniquet was elevated to 300 mm after exsanguination of the limb with a  4 to Esmarch  Wheezes same medial incision we opened it up there was serous fluid with no purulence however I did take anaerobic and aerobic cultures and thorough irrigation was performed  The screw was removed  The medial malleolus was examined for union. There are still motion at the fracture site so I decided to insert 2 K wires and the 0.45 size; we inserted the K wires gaining purchase on the tibial shaft I then backed the K wires out bent them and drove ligament of the bone until they were flush. X-rays confirmed position of the fracture and the K wires  Each showing posterior and lateral tibial purchase  Irrigation and closure with #1 Vicryl and staples. 30 mL of Marcaine with epinephrine injected around the skin and subcutaneous tissues  Sugar tong splint applied  Patient expired taken recovery room in stable condition  My postoperative plan is to bring the patient in for x-ray and casting and a few days

## 2016-03-08 NOTE — Brief Op Note (Signed)
03/08/2016  1:22 PM  PATIENT:  [name]  65 y.o. female  PRE-OPERATIVE DIAGNOSIS:  RIGHT BIMALLEOLAR ANKLE FRACTURE, SCREW FAILURE  POST-OPERATIVE DIAGNOSIS:  RIGHT BIMALLEOLAR ANKLE FRACTURE, SCREW FAILURE  PROCEDURE:  Procedure(s) with comments: HARDWARE REMOVAL AND INTERNAL FIXATION OF medial MALLEOLUS (Right) - RIGHT ANKLE REMOVAL OF MALLEOLAR SCREW, INSERTION OF 2  0.45 K WIRES  SURGEON:  Surgeon(s) and Role:    * Stanley E Harrison, MD - Primary  PHYSICIAN ASSISTANT:   ASSISTANTS: Betty Ashley   ANESTHESIA:   general  EBL:  Total I/O In: -  Out: 10 [Blood:10]  BLOOD ADMINISTERED:none  DRAINS: none   LOCAL MEDICATIONS USED:  MARCAINE     SPECIMEN:  Source of Specimen:  Anaerobic and aerobic culture  DISPOSITION OF SPECIMEN:  Microbiology  COUNTS:  YES  TOURNIQUET:    DICTATION: .Dragon Dictation  PLAN OF CARE: Discharge to home after PACU  PATIENT DISPOSITION:  PACU - hemodynamically stable.   Delay start of Pharmacological VTE agent (>24hrs) due to surgical blood loss or risk of bleeding: not applicable  Details of surgery  Indications the patient had open treatment internal fixation of the right ankle. Did well x-rays look good for the first few weeks then we noticed that the cannulated screw is backing out so we exchanged for larger screw with a wider pitch on the threads however in the postop period that screw became loose  We took her back to surgery today to remove that screw examine the fracture for infection with cultures and also examine the fracture for union and if not insertion of alternate fixation  We examined her in the preop area her wound was okay for surgery. We marked the surgical site and updated the chart  We took her to the operating room, she had general anesthesia with an LMA. She was in the supine position.  After sterile prep and drape and timeout the tourniquet was elevated to 300 mm after exsanguination of the limb with a  4 to Esmarch  Wheezes same medial incision we opened it up there was serous fluid with no purulence however I did take anaerobic and aerobic cultures and thorough irrigation was performed  The screw was removed  The medial malleolus was examined for union. There are still motion at the fracture site so I decided to insert 2 K wires and the 0.45 size; we inserted the K wires gaining purchase on the tibial shaft I then backed the K wires out bent them and drove ligament of the bone until they were flush. X-rays confirmed position of the fracture and the K wires  Each showing posterior and lateral tibial purchase  Irrigation and closure with #1 Vicryl and staples. 30 mL of Marcaine with epinephrine injected around the skin and subcutaneous tissues  Sugar tong splint applied  Patient expired taken recovery room in stable condition  My postoperative plan is to bring the patient in for x-ray and casting and a few days  

## 2016-03-08 NOTE — Anesthesia Procedure Notes (Signed)
Procedure Name: LMA Insertion Date/Time: 03/08/2016 12:35 PM Performed by: Glynn OctaveANIEL, Quintan Saldivar E Pre-anesthesia Checklist: Patient identified, Patient being monitored, Emergency Drugs available, Timeout performed and Suction available Patient Re-evaluated:Patient Re-evaluated prior to inductionOxygen Delivery Method: Circle System Utilized Preoxygenation: Pre-oxygenation with 100% oxygen Intubation Type: IV induction Ventilation: Mask ventilation without difficulty LMA: LMA inserted LMA Size: 4.0 Number of attempts: 1 Placement Confirmation: positive ETCO2 and breath sounds checked- equal and bilateral

## 2016-03-08 NOTE — Anesthesia Postprocedure Evaluation (Signed)
Anesthesia Post Note  Patient: [name]  Procedure(s) Performed: Procedure(s) (LRB): HARDWARE REMOVAL AND INTERNAL FIXATION OF MALLEOLAR BONE (Right)  Patient location during evaluation: PACU Anesthesia Type: General Level of consciousness: awake and alert and oriented Pain management: pain level controlled Vital Signs Assessment: post-procedure vital signs reviewed and stable Respiratory status: spontaneous breathing Cardiovascular status: stable Postop Assessment: no signs of nausea or vomiting Anesthetic complications: no    Last Vitals:  Vitals:   03/08/16 1220 03/08/16 1330  BP: (!) 125/55 (!) 157/97  Pulse:  85  Resp: 15 17  Temp:  36.6 C    Last Pain:  Vitals:   03/08/16 1150  TempSrc: Oral  PainSc: 5                  Makinze Jani

## 2016-03-08 NOTE — Transfer of Care (Signed)
Immediate Anesthesia Transfer of Care Note  Patient: [name] HammockSandra [name]  Procedure(s) Performed: Procedure(s) with comments: HARDWARE REMOVAL AND INTERNAL FIXATION OF MALLEOLAR BONE (Right) - RIGHT ANKLE REMOVAL OF MALLEOLAR SCREW  Patient Location: PACU  Anesthesia Type:General  Level of Consciousness: awake and alert   Airway & Oxygen Therapy: Patient Spontanous Breathing  Post-op Assessment: Report given to RN  Post vital signs: Reviewed  Last Vitals:  Vitals:   03/08/16 1215 03/08/16 1220  BP:  (!) 125/55  Pulse:    Resp: 13 15  Temp:      Last Pain:  Vitals:   03/08/16 1150  TempSrc: Oral  PainSc: 5       Patients Stated Pain Goal: 4 (03/08/16 1150)  Complications: No apparent anesthesia complications

## 2016-03-08 NOTE — H&P (Signed)
[name] is an 65 y.o. female.   Chief Complaint: Loose screw right ankle   HPI: 65 year old female treated for ankle fracture with internal fixation medial and lateral malleolus. Initial surgery was a 23. Medial screw loosened was treated with a larger screw on 9/8 screw shows loosening is well she presents for removal of screw  She's not noticed any increased pain related to this. X-rays show ankle mortise still intact. One of the screws on the medial side has remained intact  The patient has a lot of bone loss on the medial side there is some ostial lysis of the screw so there is concern for infection.    Past Medical History:  Diagnosis Date  . Anxiety   . Arthritis   . Borderline hypertension   . Chronic lower back pain   . Chronic shoulder pain   . Depression   . Fe deficiency anemia   . GERD (gastroesophageal reflux disease)   . Heartburn   . Hiatal hernia   . History of cardiac catheterization    Normal coronaries  . History of mixed drug abuse   . Hyperlipidemia   . Hypertension   . Hypothyroidism    [name]   . Lumbar herniated disc    L4-L5   . Microscopic hematuria   . Obesity   . Personal history of venous thrombosis and embolism   . PSVT (paroxysmal supraventricular tachycardia) (HCC) 8/05  . Recurrent cystitis    Dr. Aldean Ast   . Shortness of breath dyspnea     Past Surgical History:  Procedure Laterality Date  . APPENDECTOMY  2004?or 11/03?  [name] BACK SURGERY    . BLADDER REPAIR    . CARPAL TUNNEL RELEASE     x2   . HARDWARE REMOVAL Right 02/18/2016   Procedure: HARDWARE REMOVAL;  Surgeon: Vickki Hearing, MD;  Location: [facility];  Service: Orthopedics;  Laterality: Right;  right ankle medial malleolar screw  . LIVER BIOPSY  2002   Dr. Katrinka Blazing   . ORIF ANKLE FRACTURE Right 02/02/2016   Procedure: OPEN REDUCTION INTERNAL FIXATION (ORIF) ANKLE FRACTURE;  Surgeon: Vickki Hearing, MD;  Location: [facility];  Service: Orthopedics;  Laterality: Right;   . ORIF ANKLE FRACTURE Right 02/18/2016   Procedure: OPEN REDUCTION INTERNAL FIXATION (ORIF) ANKLE FRACTURE;  Surgeon: Vickki Hearing, MD;  Location: [facility];  Service: Orthopedics;  Laterality: Right;  reinsertion of medial malleolar screw  . PARTIAL HYSTERECTOMY  2004   One ovary   . RECTAL PROLAPSE REPAIR    . RECTOCELE REPAIR  05/2001  . Right knee arthroscopy  8/05  . Right knee replacement  8/05  . SHOULDER SURGERY      Family History  Problem Relation Age of Onset  . Breast cancer Mother   . Breast cancer Sister   . Heart attack      Family history   . Coronary artery disease      Family History    Social History:  reports that she has never smoked. She has never used smokeless tobacco. She reports that she does not drink alcohol or use drugs.  Allergies:  Allergies  Allergen Reactions  . Clindamycin     REACTION: caused C-diff per patient  . Diphenhydramine Hcl Other (See Comments)    REACTION:  Severely drowsy  . Naproxen     Burning in lower abdominal area.    No prescriptions prior to admission.    No results found for this  or any previous visit (from the past 48 hour(s)). No results found.  Review of Systems  Constitutional: Negative for chills and fever.  Neurological: Negative for tingling, tremors and sensory change.  Psychiatric/Behavioral: Positive for depression.  All other systems reviewed and are negative.   Pulse 62, temperature 97.8 F (36.6 C), temperature source Oral, resp. rate 18, SpO2 95 %. Physical Exam  Constitutional: She is oriented to person, place, and time. She appears well-nourished.  Eyes: Right eye exhibits no discharge. Left eye exhibits no discharge. No scleral icterus.  Neck: Neck supple. No JVD present. No tracheal deviation present.  Cardiovascular: Intact distal pulses.   Respiratory: Effort normal. No stridor.  GI: Soft. She exhibits no distension.  Neurological: She is alert and oriented to person, place, and time.  She has normal reflexes. She exhibits normal muscle tone. Coordination normal.  Skin: Skin is warm and dry. No rash noted. No erythema. No pallor.  Psychiatric: She has a normal mood and affect. Her behavior is normal. Thought content normal.    Right ankle lateral incision healed with no erythema no drainage  Medial wound is healed as well. There is no prominence or palpation of the screw medially. Ankle alignment looks normal. Her range of motion is approximately 20 with 10 of plantarflexion and dorsiflexion. Ankle remain stable. Muscle tone normal.  Upper extremities normal without clubbing cyanosis or edema normal range of motion stability strength and alignment   Assessment/Plan Loss of fixation medial malleolus right ankle  Plan remove the screw assess fracture healing culture the bone question reinsertion of pin/K wire right ankle    Fuller CanadaStanley Harrison, MD 03/08/2016, 9:34 AM

## 2016-03-10 ENCOUNTER — Other Ambulatory Visit: Payer: Self-pay

## 2016-03-10 MED ORDER — CLONAZEPAM 2 MG PO TABS: ORAL_TABLET | ORAL | 0 refills | Status: DC

## 2016-03-10 NOTE — Telephone Encounter (Signed)
Authorize 30 days only. Then contact the patient letting them know that they will need an appointment before any further prescriptions can be sent in. 

## 2016-03-13 ENCOUNTER — Other Ambulatory Visit: Payer: Self-pay | Admitting: Family Medicine

## 2016-03-13 ENCOUNTER — Encounter (HOSPITAL_COMMUNITY): Payer: Self-pay | Admitting: Orthopedic Surgery

## 2016-03-13 LAB — AEROBIC/ANAEROBIC CULTURE W GRAM STAIN (SURGICAL/DEEP WOUND): Culture: NO GROWTH

## 2016-03-13 LAB — AEROBIC/ANAEROBIC CULTURE (SURGICAL/DEEP WOUND)

## 2016-03-14 NOTE — Telephone Encounter (Signed)
Last seen stacks 01/11/16

## 2016-03-15 ENCOUNTER — Encounter: Payer: Self-pay | Admitting: Orthopedic Surgery

## 2016-03-15 ENCOUNTER — Ambulatory Visit (INDEPENDENT_AMBULATORY_CARE_PROVIDER_SITE_OTHER): Payer: Medicaid Other

## 2016-03-15 ENCOUNTER — Ambulatory Visit (INDEPENDENT_AMBULATORY_CARE_PROVIDER_SITE_OTHER): Payer: Medicaid Other | Admitting: Orthopedic Surgery

## 2016-03-15 VITALS — BP 102/62 | HR 66 | Ht 66.0 in | Wt 225.0 lb

## 2016-03-15 DIAGNOSIS — Z4789 Encounter for other orthopedic aftercare: Secondary | ICD-10-CM

## 2016-03-15 DIAGNOSIS — S82841G Displaced bimalleolar fracture of right lower leg, subsequent encounter for closed fracture with delayed healing: Secondary | ICD-10-CM

## 2016-03-15 MED ORDER — OXYCODONE-ACETAMINOPHEN 5-325 MG PO TABS: 1.0000 | ORAL_TABLET | ORAL | 0 refills | Status: DC | PRN

## 2016-03-15 NOTE — Progress Notes (Signed)
Chief Complaint  Patient presents with  . Routine Post Op    right ankle hardware removal and interanal fixation DOS 03/08/16    Patient name internal fixation of the ankle on 02/02/2016  Had revision surgery 02/18/2016 and then a second revision surgery 03/08/2016  Comes in for wound check and x-ray  Wound looks clean dry and intact postop day #7  X-ray show intact hardware including recent pin fixation prior screw still intact  Ankle mortise intact    Patient placed in a sugar tong splint remove staples one week application short leg cast next week

## 2016-03-22 ENCOUNTER — Ambulatory Visit (INDEPENDENT_AMBULATORY_CARE_PROVIDER_SITE_OTHER): Payer: Medicaid Other | Admitting: Orthopedic Surgery

## 2016-03-22 ENCOUNTER — Ambulatory Visit: Payer: Medicaid Other

## 2016-03-22 ENCOUNTER — Encounter: Payer: Self-pay | Admitting: Orthopedic Surgery

## 2016-03-22 VITALS — BP 100/53 | HR 62 | Wt 225.0 lb

## 2016-03-22 DIAGNOSIS — S82891D Other fracture of right lower leg, subsequent encounter for closed fracture with routine healing: Secondary | ICD-10-CM

## 2016-03-22 DIAGNOSIS — Z4789 Encounter for other orthopedic aftercare: Secondary | ICD-10-CM

## 2016-03-22 MED ORDER — OXYCODONE-ACETAMINOPHEN 5-325 MG PO TABS: 1.0000 | ORAL_TABLET | Freq: Four times a day (QID) | ORAL | 0 refills | Status: DC | PRN

## 2016-03-22 NOTE — Progress Notes (Signed)
Patient ID: Lavella HammockSandra Kassab, female   DOB: 06/19/1950, 65 y.o.   MRN: [id number]  Post op visit   Chief Complaint  Patient presents with  . Follow-up    HARDWARE REMOVAL RIGHT ANKLE, DOS 03/08/16    BP (!) 100/53   Pulse 62   Wt 225 lb (102.1 kg)   BMI 36.32 kg/m   Her surgery was on August 23 Second surgery was September 8 Third surgery September 27  49 days postop from the original fracture and surgery  This is postop day 14 from the most recent hardware removal and reinsertion of K wires.  Wound looks clean all staples were removed  Short leg walking cast  X-ray 4 weeks

## 2016-03-31 ENCOUNTER — Other Ambulatory Visit: Payer: Self-pay | Admitting: Family Medicine

## 2016-04-05 ENCOUNTER — Telehealth: Payer: Self-pay | Admitting: Orthopedic Surgery

## 2016-04-05 NOTE — Telephone Encounter (Signed)
Oxycodone-Acetaminophen 5/325mg   Qty 56 Tablets  Take 1 tablet by mouth every 6 (six) hours as needed for severe pain.

## 2016-04-06 ENCOUNTER — Other Ambulatory Visit: Payer: Self-pay | Admitting: *Deleted

## 2016-04-06 DIAGNOSIS — Z4789 Encounter for other orthopedic aftercare: Secondary | ICD-10-CM

## 2016-04-06 MED ORDER — OXYCODONE-ACETAMINOPHEN 5-325 MG PO TABS: 1.0000 | ORAL_TABLET | Freq: Four times a day (QID) | ORAL | 0 refills | Status: DC | PRN

## 2016-04-06 NOTE — Telephone Encounter (Signed)
YES

## 2016-04-06 NOTE — Telephone Encounter (Signed)
Routing to Dr Harrison for approval 

## 2016-04-13 ENCOUNTER — Other Ambulatory Visit: Payer: Self-pay | Admitting: Family Medicine

## 2016-04-14 ENCOUNTER — Other Ambulatory Visit: Payer: Self-pay | Admitting: *Deleted

## 2016-04-14 ENCOUNTER — Other Ambulatory Visit: Payer: Self-pay

## 2016-04-14 ENCOUNTER — Telehealth: Payer: Self-pay | Admitting: Family Medicine

## 2016-04-14 NOTE — Telephone Encounter (Signed)
[facility] [place] [facility] requesting ref[facility] for patient.  Patient last seen 01/11/16.  Please review and route to pools.

## 2016-04-14 NOTE — Telephone Encounter (Signed)
Surgery was 6 weeks ago, so she will need to be seen

## 2016-04-15 ENCOUNTER — Other Ambulatory Visit: Payer: Self-pay | Admitting: Family Medicine

## 2016-04-15 NOTE — Telephone Encounter (Signed)
Pt notified [facility] appt scheduled 

## 2016-04-17 ENCOUNTER — Ambulatory Visit (INDEPENDENT_AMBULATORY_CARE_PROVIDER_SITE_OTHER): Payer: Medicaid Other | Admitting: Family Medicine

## 2016-04-17 ENCOUNTER — Encounter: Payer: Self-pay | Admitting: Family Medicine

## 2016-04-17 VITALS — BP 107/61 | HR 79 | Temp 97.1°F

## 2016-04-17 DIAGNOSIS — F411 Generalized anxiety disorder: Secondary | ICD-10-CM | POA: Diagnosis not present

## 2016-04-17 DIAGNOSIS — E875 Hyperkalemia: Secondary | ICD-10-CM

## 2016-04-17 DIAGNOSIS — E039 Hypothyroidism, unspecified: Secondary | ICD-10-CM

## 2016-04-17 DIAGNOSIS — F1320 Sedative, hypnotic or anxiolytic dependence, uncomplicated: Secondary | ICD-10-CM | POA: Diagnosis not present

## 2016-04-17 DIAGNOSIS — Z23 Encounter for immunization: Secondary | ICD-10-CM | POA: Diagnosis not present

## 2016-04-17 MED ORDER — CLONAZEPAM 2 MG PO TABS: ORAL_TABLET | ORAL | 5 refills | Status: DC

## 2016-04-17 NOTE — Progress Notes (Signed)
Subjective:  Patient ID: [name], female    DOB: 1950/09/27  Age: 65 y.o. MRN: [id number]  CC: Medication Refill   HPI [name] presents for Patient presents for follow-up on  thyroid. The patient has a history of hypothyroidism for many years. It has been stable recently. Pt. denies any change in  voice, loss of hair, heat or cold intolerance. Energy level has been adequate to good. Patient denies constipation and diarrhea. No myxedema. Medication is as noted below. Verified that pt is taking it daily on an empty stomach. Well tolerated.  Patient has a great deal of anxiety. She also has a long history of benzodiazepine dependence. She has had recent surgery which did not go well for her ankle. She had to go back for 2 additional procedures. She is still under orthopedics care.   GAD 7 : Generalized Anxiety Score 06/29/2015  Nervous, Anxious, on Edge 2  Control/stop worrying 3  Worry too much - different things 2  Trouble relaxing 1  Restless 0  Easily annoyed or irritable 1  Afraid - awful might happen 0  Total GAD 7 Score 9  Anxiety Difficulty Somewhat difficult    Sx reviewed as above. GAD 7 score unchanged today.  History [name] has a past medical history of Anxiety; Arthritis; Borderline hypertension; Chronic lower back pain; Chronic shoulder pain; Depression; Fe deficiency anemia; GERD (gastroesophageal reflux disease); Heartburn; Hiatal hernia; History of cardiac catheterization; History of mixed drug abuse; Hyperlipidemia; Hypertension; Hypothyroidism; Lumbar herniated disc; Microscopic hematuria; Obesity; Personal history of venous thrombosis and embolism; PSVT (paroxysmal supraventricular tachycardia) ([place]) (8/05); Recurrent cystitis; and Shortness of breath dyspnea.   She has a past surgical history that includes Appendectomy (2004?or 11/03?); Partial hysterectomy (2004); Right knee arthroscopy (8/05); Right knee replacement (8/05); Carpal tunnel release; Shoulder  surgery; Rectocele repair (05/2001); Liver biopsy (2002); Bladder repair; Rectal prolapse repair; Back surgery; ORIF ankle fracture (Right, 02/02/2016); Hardware Removal (Right, 02/18/2016); ORIF ankle fracture (Right, 02/18/2016); and Hardware Removal (Right, 03/08/2016).   Her family history includes Breast cancer in her mother and sister.She reports that she has never smoked. She has never used smokeless tobacco. She reports that she does not drink alcohol or use drugs.    ROS Review of Systems  Constitutional: Negative for activity change, appetite change and fever.  HENT: Negative for congestion, rhinorrhea and sore throat.   Eyes: Negative for visual disturbance.  Respiratory: Negative for cough and shortness of breath.   Cardiovascular: Negative for chest pain and palpitations.  Gastrointestinal: Negative for abdominal pain, diarrhea and nausea.  Genitourinary: Negative for dysuria.  Musculoskeletal: Negative for arthralgias and myalgias.    Objective:  BP 107/61   Pulse 79   Temp 97.1 F (36.2 C) (Oral)   BP Readings from Last 3 Encounters:  04/17/16 107/61  03/22/16 (!) 100/53  03/15/16 102/62    Wt Readings from Last 3 Encounters:  03/22/16 225 lb (102.1 kg)  03/15/16 225 lb (102.1 kg)  02/25/16 225 lb (102.1 kg)     Physical Exam  Constitutional: She is oriented to person, place, and time. She appears well-developed and well-nourished. No distress.  HENT:  Head: Normocephalic and atraumatic.  Right Ear: External ear normal.  Left Ear: External ear normal.  Nose: Nose normal.  Mouth/Throat: Oropharynx is clear and moist.  Eyes: Conjunctivae and EOM are normal. Pupils are equal, round, and reactive to light.  Neck: Normal range of motion. Neck supple. No thyromegaly present.  Cardiovascular: Normal rate, regular  rhythm and normal heart sounds.   No murmur heard. Pulmonary/Chest: Effort normal and breath sounds normal. No respiratory distress. She has no wheezes.  She has no rales.  Abdominal: Soft. Bowel sounds are normal. She exhibits no distension. There is no tenderness.  Lymphadenopathy:    She has no cervical adenopathy.  Neurological: She is alert and oriented to person, place, and time. She has normal reflexes.  Skin: Skin is warm and dry.  Psychiatric: Her speech is normal. Her mood appears anxious. She is agitated. Thought content is paranoid. Cognition and memory are normal. She expresses impulsivity and inappropriate judgment.      No results found.  Assessment & Plan:   [name] was seen today for medication refill.  Diagnoses and all orders for this visit:  Hypothyroidism, unspecified type -     TSH + free T4 -     CBC with Differential/Platelet -     CMP14+EGFR  GAD (generalized anxiety disorder)  Benzodiazepine dependence ([place])  Encounter for immunization -     Flu Vaccine QUAD 36+ mos IM  Serum potassium elevated -     CMP14+EGFR; Future  Other orders -     Discontinue: clonazePAM (KLONOPIN) 2 MG tablet; TAKE 1 TABLET TWICE DAILY AS NEEDED FOR ANXIETY -     clonazePAM (KLONOPIN) 2 MG tablet; TAKE 1 TABLET TWICE DAILY AS NEEDED FOR ANXIETY    I have discontinued [name]'s ciprofloxacin. I am also having her maintain her Cholecalciferol (VITAMIN D-3 PO), omega-3 acid ethyl esters, Calcium Carbonate-Vitamin D, aspirin EC, Ospemifene, triamterene-hydrochlorothiazide, ezetimibe-simvastatin, metoprolol succinate, levothyroxine, RESTASIS, chlorthalidone, atorvastatin, AMITIZA, sulfamethoxazole-trimethoprim, omeprazole, hydrocortisone, oxyCODONE-acetaminophen, cyclobenzaprine, PREMARIN, furosemide, and clonazePAM.  Meds ordered this encounter  Medications  . DISCONTD: clonazePAM (KLONOPIN) 2 MG tablet    Sig: TAKE 1 TABLET TWICE DAILY AS NEEDED FOR ANXIETY    Dispense:  60 tablet    Refill:  5  . clonazePAM (KLONOPIN) 2 MG tablet    Sig: TAKE 1 TABLET TWICE DAILY AS NEEDED FOR ANXIETY    Dispense:  60 tablet     Refill:  5     Follow-up: Return in about 6 months (around 10/15/2016) for anxiety, Hypothyroidism, hypertension, Wellness.  Claretta Fraise, [name]

## 2016-04-17 NOTE — Telephone Encounter (Signed)
Pt has appt today 11/6

## 2016-04-17 NOTE — Telephone Encounter (Signed)
Patient aware that she needs to be seen.  Appointment had already made appointment for today at 2:55 pm.

## 2016-04-18 ENCOUNTER — Telehealth: Payer: Self-pay | Admitting: *Deleted

## 2016-04-18 ENCOUNTER — Other Ambulatory Visit: Payer: Self-pay | Admitting: Family Medicine

## 2016-04-18 LAB — CBC WITH DIFFERENTIAL/PLATELET
BASOS: 0 %
Basophils Absolute: 0 10*3/uL (ref 0.0–0.2)
EOS (ABSOLUTE): 0.2 10*3/uL (ref 0.0–0.4)
Eos: 2 %
HEMOGLOBIN: 13.8 g/dL (ref 11.1–15.9)
Hematocrit: 40.4 % (ref 34.0–46.6)
IMMATURE GRANS (ABS): 0 10*3/uL (ref 0.0–0.1)
Immature Granulocytes: 0 %
LYMPHS: 44 %
Lymphocytes Absolute: 4 10*3/uL — ABNORMAL HIGH (ref 0.7–3.1)
MCH: 28.9 pg (ref 26.6–33.0)
MCHC: 34.2 g/dL (ref 31.5–35.7)
MCV: 85 fL (ref 79–97)
Monocytes Absolute: 0.7 10*3/uL (ref 0.1–0.9)
Monocytes: 7 %
NEUTROS ABS: 4.2 10*3/uL (ref 1.4–7.0)
Neutrophils: 47 %
PLATELETS: 269 10*3/uL (ref 150–379)
RBC: 4.78 x10E6/uL (ref 3.77–5.28)
RDW: 13 % (ref 12.3–15.4)
WBC: 8.9 10*3/uL (ref 3.4–10.8)

## 2016-04-18 LAB — CMP14+EGFR
A/G RATIO: 1.5 (ref 1.2–2.2)
ALBUMIN: 4.4 g/dL (ref 3.6–4.8)
ALT: 15 IU/L (ref 0–32)
AST: 20 IU/L (ref 0–40)
Alkaline Phosphatase: 94 IU/L (ref 39–117)
BILIRUBIN TOTAL: 0.3 mg/dL (ref 0.0–1.2)
BUN / CREAT RATIO: 21 (ref 12–28)
BUN: 22 mg/dL (ref 8–27)
CHLORIDE: 95 mmol/L — AB (ref 96–106)
CO2: 27 mmol/L (ref 18–29)
Calcium: 9.7 mg/dL (ref 8.7–10.3)
Creatinine, Ser: 1.03 mg/dL — ABNORMAL HIGH (ref 0.57–1.00)
GFR calc non Af Amer: 58 mL/min/{1.73_m2} — ABNORMAL LOW (ref >59)
GFR, EST AFRICAN AMERICAN: 66 mL/min/{1.73_m2} (ref >59)
Globulin, Total: 3 g/dL (ref 1.5–4.5)
Glucose: 100 mg/dL — ABNORMAL HIGH (ref 65–99)
POTASSIUM: 3.2 mmol/L — AB (ref 3.5–5.2)
SODIUM: 141 mmol/L (ref 134–144)
TOTAL PROTEIN: 7.4 g/dL (ref 6.0–8.5)

## 2016-04-18 LAB — TSH+FREE T4
Free T4: 1.39 ng/dL (ref 0.82–1.77)
TSH: 2.1 u[IU]/mL (ref 0.450–4.500)

## 2016-04-18 MED ORDER — POTASSIUM CHLORIDE ER 10 MEQ PO TBCR: EXTENDED_RELEASE_TABLET | ORAL | 2 refills | Status: DC

## 2016-04-18 MED ORDER — POTASSIUM CHLORIDE CRYS ER 20 MEQ PO TBCR: EXTENDED_RELEASE_TABLET | ORAL | 4 refills | Status: DC

## 2016-04-18 NOTE — Telephone Encounter (Signed)
Patient wants to know if we can change her potassium to and taking 10 a day since they are coated and smaller which makes it easier for her to swallow.

## 2016-04-18 NOTE — Telephone Encounter (Signed)
Sure, please write.

## 2016-04-18 NOTE — Telephone Encounter (Signed)
Patient aware that medication has been sent in.  °

## 2016-04-19 ENCOUNTER — Encounter: Payer: Self-pay | Admitting: Orthopedic Surgery

## 2016-04-19 ENCOUNTER — Ambulatory Visit: Payer: Medicaid Other | Admitting: Orthopedic Surgery

## 2016-04-19 ENCOUNTER — Ambulatory Visit (INDEPENDENT_AMBULATORY_CARE_PROVIDER_SITE_OTHER): Payer: Medicaid Other

## 2016-04-19 DIAGNOSIS — S82891D Other fracture of right lower leg, subsequent encounter for closed fracture with routine healing: Secondary | ICD-10-CM

## 2016-04-19 NOTE — Progress Notes (Signed)
Patient ID: [name] HammockSandra [name], female   DOB: 11/26/1950, 65 y.o.   MRN: [id number]  Chief Complaint  Patient presents with  . Follow-up    RIGHT ANKLE FRACTURE, DOS 9/8, 9/27    HPI [name] is a 65 y.o. female.   HPI  Her surgery was on August 23 Second surgery was September 8 Third surgery September 27     Review of Systems Review of Systems   Physical Exam There were no vitals taken for this visit.   Physical Exam  Clinically the foot is aligned well the wounds have healed  The x-ray shows fracture is healing.  Recommend Aircast weight-bear as tolerated x-ray in 4 weeks

## 2016-04-24 ENCOUNTER — Other Ambulatory Visit: Payer: Medicaid Other

## 2016-04-24 DIAGNOSIS — E875 Hyperkalemia: Secondary | ICD-10-CM

## 2016-04-25 LAB — CMP14+EGFR
ALBUMIN: 4.7 g/dL (ref 3.6–4.8)
ALT: 15 IU/L (ref 0–32)
AST: 19 IU/L (ref 0–40)
Albumin/Globulin Ratio: 1.7 (ref 1.2–2.2)
Alkaline Phosphatase: 99 IU/L (ref 39–117)
BUN/Creatinine Ratio: 21 (ref 12–28)
BUN: 21 mg/dL (ref 8–27)
Bilirubin Total: 0.4 mg/dL (ref 0.0–1.2)
CALCIUM: 10.3 mg/dL (ref 8.7–10.3)
CO2: 27 mmol/L (ref 18–29)
CREATININE: 1 mg/dL (ref 0.57–1.00)
Chloride: 99 mmol/L (ref 96–106)
GFR calc Af Amer: 69 mL/min/{1.73_m2} (ref >59)
GFR, EST NON AFRICAN AMERICAN: 60 mL/min/{1.73_m2} (ref >59)
GLOBULIN, TOTAL: 2.7 g/dL (ref 1.5–4.5)
Glucose: 73 mg/dL (ref 65–99)
Potassium: 4.3 mmol/L (ref 3.5–5.2)
SODIUM: 140 mmol/L (ref 134–144)
Total Protein: 7.4 g/dL (ref 6.0–8.5)

## 2016-04-28 ENCOUNTER — Telehealth: Payer: Self-pay | Admitting: Orthopedic Surgery

## 2016-04-28 NOTE — Telephone Encounter (Signed)
Patient called this morning and stated she wanted to speak with you.  I offered to help her but she said no, she wanted to speak with you regarding something personal.    Please call her at [phone]  Thanks

## 2016-04-28 NOTE — Telephone Encounter (Signed)
SPOKE WITH PATIENT

## 2016-05-01 ENCOUNTER — Other Ambulatory Visit: Payer: Self-pay | Admitting: Family Medicine

## 2016-05-07 DIAGNOSIS — F1320 Sedative, hypnotic or anxiolytic dependence, uncomplicated: Secondary | ICD-10-CM | POA: Insufficient documentation

## 2016-05-13 ENCOUNTER — Other Ambulatory Visit: Payer: Self-pay | Admitting: Family Medicine

## 2016-05-15 NOTE — Telephone Encounter (Signed)
Please advise and route to pools. 

## 2016-05-17 ENCOUNTER — Ambulatory Visit (INDEPENDENT_AMBULATORY_CARE_PROVIDER_SITE_OTHER): Payer: Self-pay | Admitting: Orthopedic Surgery

## 2016-05-17 ENCOUNTER — Encounter: Payer: Self-pay | Admitting: Orthopedic Surgery

## 2016-05-17 ENCOUNTER — Ambulatory Visit (INDEPENDENT_AMBULATORY_CARE_PROVIDER_SITE_OTHER): Payer: Medicaid Other

## 2016-05-17 DIAGNOSIS — S82891D Other fracture of right lower leg, subsequent encounter for closed fracture with routine healing: Secondary | ICD-10-CM | POA: Diagnosis not present

## 2016-05-17 NOTE — Progress Notes (Signed)
Patient ID: [name] HammockSandra [name], female   DOB: 10/15/1950, 65 y.o.   MRN: [id number]  Post op visit   Chief Complaint  Patient presents with  . Follow-up    RIGHT ANKLE FRACTURE, DOS 8/23,9/8,9/27    The patient is status post multiple revisions of the right ankle fracture starting on 98 medial screw removal and exchange screw and then on 927 removal of screw and insertion of K wire  X-ray show fracture has healed K wires are intact well controlled he is ambulatory with a walker in an Aircast  The incisions look clean dry and intact the ankle looks good.  X-rays show the fracture healing mortise intact hardware intact  The patient can convert to Aircast only follow-up x-ray 4 weeks  Encounter Diagnosis  Name Primary?  . Closed fracture of right ankle with routine healing, subsequent encounter Yes

## 2016-05-17 NOTE — Patient Instructions (Signed)
YOU CAN STOP USING THE WALKER  BUT KEEP THE BRACE ON

## 2016-06-12 ENCOUNTER — Other Ambulatory Visit: Payer: Self-pay | Admitting: Family Medicine

## 2016-06-13 ENCOUNTER — Ambulatory Visit: Payer: Medicaid Other | Admitting: Orthopedic Surgery

## 2016-06-15 ENCOUNTER — Ambulatory Visit: Payer: Medicaid Other | Admitting: Cardiology

## 2016-06-15 NOTE — Progress Notes (Deleted)
Cardiology Office Note  Date: 06/15/2016   ID: [name], DOB 08-23-1950, MRN [id number]  PCP: Mechele Claude, MD  Primary Cardiologist: Nona Dell, MD   No chief complaint on file.   History of Present Illness: [name] is a 66 y.o. female last seen in December 2016.  At the last visit we increased her Toprol-XL to 50 mg twice daily.  Past Medical History:  Diagnosis Date  . Anxiety   . Arthritis   . Borderline hypertension   . Chronic lower back pain   . Chronic shoulder pain   . Depression   . Fe deficiency anemia   . GERD (gastroesophageal reflux disease)   . Heartburn   . Hiatal hernia   . History of cardiac catheterization    Normal coronaries  . History of mixed drug abuse   . Hyperlipidemia   . Hypertension   . Hypothyroidism    [name]   . Lumbar herniated disc    L4-L5   . Microscopic hematuria   . Obesity   . Personal history of venous thrombosis and embolism   . PSVT (paroxysmal supraventricular tachycardia) ([facility]) 8/05  . Recurrent cystitis    Dr. Aldean Ast   . Shortness of breath dyspnea     Past Surgical History:  Procedure Laterality Date  . APPENDECTOMY  2004?or 11/03?  [name] BACK SURGERY    . BLADDER REPAIR    . CARPAL TUNNEL RELEASE     x2   . HARDWARE REMOVAL Right 02/18/2016   Procedure: HARDWARE REMOVAL;  Surgeon: Vickki Hearing, MD;  Location: [facility];  Service: Orthopedics;  Laterality: Right;  right ankle medial malleolar screw  . HARDWARE REMOVAL Right 03/08/2016   Procedure: HARDWARE REMOVAL AND INTERNAL FIXATION OF RIGHT MEDIAL MALLEOLUS  - RIGHT ANKLE REMOVAL OF MALLEOLAR SCREW, INSERTION OF 2  0.45 K WIRES;  Surgeon: Vickki Hearing, MD;  Location: [facility];  Service: Orthopedics;  Laterality: Right;  RIGHT ANKLE REMOVAL OF MALLEOLAR SCREW  . LIVER BIOPSY  2002   Dr. Katrinka Blazing   . ORIF ANKLE FRACTURE Right 02/02/2016   Procedure: OPEN REDUCTION INTERNAL FIXATION (ORIF) ANKLE FRACTURE;  Surgeon: Vickki Hearing, MD;   Location: [facility];  Service: Orthopedics;  Laterality: Right;  . ORIF ANKLE FRACTURE Right 02/18/2016   Procedure: OPEN REDUCTION INTERNAL FIXATION (ORIF) ANKLE FRACTURE;  Surgeon: Vickki Hearing, MD;  Location: [facility];  Service: Orthopedics;  Laterality: Right;  reinsertion of medial malleolar screw  . PARTIAL HYSTERECTOMY  2004   One ovary   . RECTAL PROLAPSE REPAIR    . RECTOCELE REPAIR  05/2001  . Right knee arthroscopy  8/05  . Right knee replacement  8/05  . SHOULDER SURGERY      Current Outpatient Prescriptions  Medication Sig Dispense Refill  . AMITIZA 24 MCG capsule TAKE ONE CAPSULE BY MOUTH TWICE A DAY 60 capsule 4  . aspirin EC 81 MG tablet Take 81 mg by mouth daily.     [name] atorvastatin (LIPITOR) 80 MG tablet TAKE ONE (1) TABLET EACH DAY 90 tablet 1  . Calcium Carbonate-Vitamin D (CALCIUM 500 + D) 500-125 MG-UNIT TABS Take by mouth. Take 1 tablet by mouth every morning.    . chlorthalidone (HYGROTON) 25 MG tablet TAKE ONE TABLET BY MOUTH TWICE DAILY 60 tablet 4  . Cholecalciferol (VITAMIN D-3 PO) Take 1 capsule by mouth every morning.     . clonazePAM (KLONOPIN) 2 MG tablet TAKE 1 TABLET TWICE DAILY  AS NEEDED FOR ANXIETY 60 tablet 5  . cyclobenzaprine (FLEXERIL) 10 MG tablet TAKE ONE TABLET BY MOUTH THREE TIMES DAILY AS NEEDED FOR MUSCLE SPASM 90 tablet 1  . ezetimibe-simvastatin (VYTORIN) 10-80 MG tablet Take 1 tablet by mouth daily.    . furosemide (LASIX) 20 MG tablet TAKE ONE (1) TABLET EACH DAY 30 tablet 3  . hydrocortisone 1 % lotion Apply 1 application topically 2 (two) times daily. 118 mL 0  . levothyroxine (SYNTHROID, LEVOTHROID) 75 MCG tablet TAKE ONE (1) TABLET EACH DAY 90 tablet 3  . metoprolol succinate (TOPROL XL) 50 MG 24 hr tablet Take 1 tablet (50 mg total) by mouth 2 (two) times daily. Take with or immediately following a meal. 180 tablet 3  . omega-3 acid ethyl esters (LOVAZA) 1 G capsule Take 1 g by mouth 2 (two) times daily.    [name]. omeprazole (PRILOSEC) 20  MG capsule TAKE ONE CAPSULE BY MOUTH TWICE A DAY 60 capsule 3  . Ospemifene (OSPHENA) 60 MG TABS Take 1 tablet by mouth daily. Reported on 06/29/2015    . oxyCODONE-acetaminophen (PERCOCET/ROXICET) 5-325 MG tablet Take 1 tablet by mouth every 6 (six) hours as needed for severe pain. 56 tablet 0  . potassium chloride (K-DUR) 10 MEQ tablet TAKE FIVE TABLETS TWICE DAILY 300 tablet 0  . potassium chloride SA (K-DUR,KLOR-CON) 20 MEQ tablet Take 2 tablets in the morning and 3 in the evening 150 tablet 4  . PREMARIN vaginal cream APPLY ONCE WEEKLY 30 g 3  . RESTASIS 0.05 % ophthalmic emulsion INSTILL ONE DROP IN BOTH EYES TWICE DAILY 60 each 11  . sulfamethoxazole-trimethoprim (BACTRIM DS,SEPTRA DS) 800-160 MG tablet Take 1 tablet by mouth 2 (two) times daily. 20 tablet 0  . triamterene-hydrochlorothiazide (MAXZIDE-25) 37.5-25 MG tablet Take 1 tablet by mouth daily.    [name]. venlafaxine XR (EFFEXOR-XR) 75 MG 24 hr capsule TAKE 3 CAPSULES EVERY MORNING 90 capsule 2   No current facility-administered medications for this visit.    Allergies:  Clindamycin; Diphenhydramine hcl; and Naproxen   Social History: The patient  reports that she has never smoked. She has never used smokeless tobacco. She reports that she does not drink alcohol or use drugs.   Family History: The patient's family history includes Breast cancer in her mother and sister.   ROS:  Please see the history of present illness. Otherwise, complete review of systems is positive for {NONE DEFAULTED:18576::"none"}.  All other systems are reviewed and negative.   Physical Exam: VS:  There were no vitals taken for this visit., BMI There is no height or weight on file to calculate BMI.  Wt Readings from Last 3 Encounters:  03/22/16 225 lb (102.1 kg)  03/15/16 225 lb (102.1 kg)  02/25/16 225 lb (102.1 kg)    Obese woman, appears comfortable at rest. HEENT: Conjunctiva and lids normal, oropharynx clear. Neck: Supple, no elevated JVP or carotid  bruits, no thyromegaly. Lungs: Clear to auscultation, nonlabored breathing at rest. Cardiac: Regular rate and rhythm, no S3 or significant systolic murmur, no pericardial rub. Extremities: No pitting edema, distal pulses 2+.  ECG: I personally reviewed the tracing from 06/11/2015 which showed sinus rhythm with right bundle branch block.  Recent Labwork: 02/03/2016: Hemoglobin 12.5 04/17/2016: Platelets 269; TSH 2.100 04/24/2016: ALT 15; AST 19; BUN 21; Creatinine, Ser 1.00; Potassium 4.3; Sodium 140     Component Value Date/Time   CHOL 203 (H) 06/29/2015 1603   CHOL 166 01/02/2013 1146   TRIG 150 (  H) 06/29/2015 1603   TRIG 122 10/26/2014 1655   TRIG 103 01/02/2013 1146   HDL 66 06/29/2015 1603   HDL 72 10/26/2014 1655   HDL 75 01/02/2013 1146   CHOLHDL 3.1 06/29/2015 1603   LDLCALC 107 (H) 06/29/2015 1603   LDLCALC 57 12/09/2013 1048   LDLCALC 70 01/02/2013 1146    Other Studies Reviewed Today:  Echocardiogram 12/19/2012: Study Conclusions  - Left ventricle: There was mild concentric hypertrophy. Systolic function was normal. The estimated ejection fraction was in the range of 55% to 60%. Left ventricular diastolic function parameters were normal. - Left atrium: The atrium was mildly dilated. - Tricuspid valve: Mild regurgitation.  Assessment and Plan:    Current medicines were reviewed with the patient today.  No orders of the defined types were placed in this encounter.   Disposition:  Signed, Jonelle Sidle, MD, [facility] 06/15/2016 11:05 AM    [facility] [address] [place], [place], [place] [address] Phone: [phone]; Fax: [phone]

## 2016-06-19 ENCOUNTER — Ambulatory Visit: Payer: Medicaid Other | Admitting: Nurse Practitioner

## 2016-06-19 ENCOUNTER — Encounter: Payer: Medicaid Other | Admitting: Orthopedic Surgery

## 2016-06-19 ENCOUNTER — Encounter (HOSPITAL_COMMUNITY): Payer: Self-pay | Admitting: Emergency Medicine

## 2016-06-19 ENCOUNTER — Emergency Department (HOSPITAL_COMMUNITY)
Admission: EM | Admit: 2016-06-19 | Discharge: 2016-06-19 | Disposition: A | Discharge status: Home / Self Care | Payer: Medicare Other | Attending: Dermatology | Admitting: Dermatology

## 2016-06-19 DIAGNOSIS — Z79899 Other long term (current) drug therapy: Secondary | ICD-10-CM | POA: Diagnosis not present

## 2016-06-19 DIAGNOSIS — I10 Essential (primary) hypertension: Secondary | ICD-10-CM | POA: Insufficient documentation

## 2016-06-19 DIAGNOSIS — E039 Hypothyroidism, unspecified: Secondary | ICD-10-CM | POA: Insufficient documentation

## 2016-06-19 DIAGNOSIS — Z5321 Procedure and treatment not carried out due to patient leaving prior to being seen by health care provider: Secondary | ICD-10-CM | POA: Diagnosis not present

## 2016-06-19 DIAGNOSIS — Z7982 Long term (current) use of aspirin: Secondary | ICD-10-CM | POA: Diagnosis not present

## 2016-06-19 DIAGNOSIS — H9203 Otalgia, bilateral: Secondary | ICD-10-CM | POA: Diagnosis not present

## 2016-06-19 DIAGNOSIS — R300 Dysuria: Secondary | ICD-10-CM | POA: Diagnosis not present

## 2016-06-19 NOTE — ED Triage Notes (Signed)
Patient complaining of bilateral ear pain x 3-4 days. Also states "I think I have a UTI because it hurts when I pee. It hurts right there at the end of me using the bathroom."

## 2016-06-19 NOTE — ED Notes (Signed)
PT told registration she was leaving at this time bc she has a MD appt at 1430 today.

## 2016-06-20 ENCOUNTER — Ambulatory Visit (INDEPENDENT_AMBULATORY_CARE_PROVIDER_SITE_OTHER): Payer: Medicare Other | Admitting: Family

## 2016-06-20 ENCOUNTER — Encounter: Payer: Self-pay | Admitting: Family Medicine

## 2016-06-20 ENCOUNTER — Encounter: Payer: Self-pay | Admitting: Family

## 2016-06-20 ENCOUNTER — Ambulatory Visit: Payer: Medicare Other

## 2016-06-20 VITALS — BP 121/74 | HR 124 | Temp 98.0°F | Ht 65.0 in | Wt 211.0 lb

## 2016-06-20 DIAGNOSIS — N3001 Acute cystitis with hematuria: Secondary | ICD-10-CM

## 2016-06-20 DIAGNOSIS — H60392 Other infective otitis externa, left ear: Secondary | ICD-10-CM | POA: Diagnosis not present

## 2016-06-20 DIAGNOSIS — R309 Painful micturition, unspecified: Secondary | ICD-10-CM | POA: Diagnosis not present

## 2016-06-20 LAB — URINALYSIS, COMPLETE
Bilirubin, UA: NEGATIVE
GLUCOSE, UA: NEGATIVE
NITRITE UA: POSITIVE — AB
SPEC GRAV UA: 1.015 (ref 1.005–1.030)
Urobilinogen, Ur: 0.2 mg/dL (ref 0.2–1.0)
pH, UA: 6 (ref 5.0–7.5)

## 2016-06-20 LAB — MICROSCOPIC EXAMINATION: Renal Epithel, UA: NONE SEEN /hpf

## 2016-06-20 MED ORDER — NITROFURANTOIN MONOHYD MACRO 100 MG PO CAPS
100.0000 mg | ORAL_CAPSULE | Freq: Two times a day (BID) | ORAL | 0 refills | Status: DC
Start: 2016-06-20 — End: 2016-07-05

## 2016-06-20 MED ORDER — OFLOXACIN 0.3 % OT SOLN: 5.0000 [drp] | Freq: Every day | OTIC | 0 refills | Status: DC

## 2016-06-20 MED ORDER — SULFAMETHOXAZOLE-TRIMETHOPRIM 800-160 MG PO TABS: 1.0000 | ORAL_TABLET | Freq: Two times a day (BID) | ORAL | 0 refills | Status: DC

## 2016-06-20 NOTE — Progress Notes (Signed)
   Subjective:    Patient ID: [name] HammockSandra Riedesel, female    DOB: 11/24/1950, 66 y.o.   MRN: [id number]  Ear Drainage   There is pain in both ears. This is a new problem. The current episode started 1 to 4 weeks ago. The problem occurs constantly. The problem has been gradually worsening. There has been no fever. The pain is at a severity of 10/10. The pain is moderate. Associated symptoms include coughing, ear discharge, neck pain, rhinorrhea and a sore throat. Pertinent negatives include no headaches or hearing loss. She has tried acetaminophen for the symptoms. The treatment provided mild relief.  Dysuria   This is a new problem. The current episode started 1 to 4 weeks ago. The problem occurs every urination. The problem has been gradually worsening. The quality of the pain is described as burning. The pain is moderate. Associated symptoms include frequency, hesitancy, nausea and urgency. Pertinent negatives include no discharge or hematuria. She has tried increased fluids for the symptoms. The treatment provided no relief.      Review of Systems  HENT: Positive for ear discharge, rhinorrhea and sore throat. Negative for hearing loss.   Respiratory: Positive for cough.   Gastrointestinal: Positive for nausea.  Genitourinary: Positive for dysuria, frequency, hesitancy and urgency. Negative for hematuria.  Musculoskeletal: Positive for neck pain.  Neurological: Negative for headaches.       Objective:   Physical Exam  Constitutional: She is oriented to person, place, and time. She appears well-developed and well-nourished. No distress.  HENT:  Head: Normocephalic and atraumatic.  Right Ear: There is tenderness.  Left Ear: There is drainage, swelling and tenderness.  Mouth/Throat: Oropharynx is clear and moist.  Eyes: Pupils are equal, round, and reactive to light.  Neck: Normal range of motion. Neck supple. No thyromegaly present.  Cardiovascular: Normal rate, regular rhythm, normal heart  sounds and intact distal pulses.   No murmur heard. Pulmonary/Chest: Effort normal and breath sounds normal. No respiratory distress. She has no wheezes.  Abdominal: Soft. Bowel sounds are normal. She exhibits no distension. There is tenderness.  Musculoskeletal: Normal range of motion. She exhibits no edema or tenderness.  Neurological: She is alert and oriented to person, place, and time. She has normal reflexes. No cranial nerve deficit.  Skin: Skin is warm and dry.  Psychiatric: She has a normal mood and affect. Her behavior is normal. Judgment and thought content normal.  Vitals reviewed.     BP 121/74   Pulse (!) 124   Temp 98 F (36.7 C) (Oral)   Ht 5\' 5"  (1.651 m)   Wt 211 lb (95.7 kg)   BMI 35.11 kg/m      Assessment & Plan:  1. Acute cystitis with hematuria Force fluids AZO over the counter X2 days RTO prn Culture pending - sulfamethoxazole-trimethoprim (BACTRIM DS) 800-160 MG tablet; Take 1 tablet by mouth 2 (two) times daily.  Dispense: 14 tablet; Refill: 0 - Urine culture  2. Voiding pain - Urinalysis, Complete  3. Infective otitis externa of left ear -Do not pick or stick anything into ears Tylenol prn for pain - ofloxacin (FLOXIN OTIC) 0.3 % otic solution; Place 5 drops into the left ear daily.  Dispense: 5 mL; Refill: 0  Jannifer Rodneyhristy Nehemyah Foushee, FNP

## 2016-06-20 NOTE — Patient Instructions (Signed)
Urinary Tract Infection, Adult Introduction A urinary tract infection (UTI) is an infection of any part of the urinary tract. The urinary tract includes the:  Kidneys.  Ureters.  Bladder.  Urethra. These organs make, store, and get rid of pee (urine) in the body. Follow these instructions at home:  Take over-the-counter and prescription medicines only as told by your doctor.  If you were prescribed an antibiotic medicine, take it as told by your doctor. Do not stop taking the antibiotic even if you start to feel better.  Avoid the following drinks:  Alcohol.  Caffeine.  Tea.  Carbonated drinks.  Drink enough fluid to keep your pee clear or pale yellow.  Keep all follow-up visits as told by your doctor. This is important.  Make sure to:  Empty your bladder often and completely. Do not to hold pee for long periods of time.  Empty your bladder before and after sex.  Wipe from front to back after a bowel movement if you are female. Use each tissue one time when you wipe. Contact a doctor if:  You have back pain.  You have a fever.  You feel sick to your stomach (nauseous).  You throw up (vomit).  Your symptoms do not get better after 3 days.  Your symptoms go away and then come back. Get help right away if:  You have very bad back pain.  You have very bad lower belly (abdominal) pain.  You are throwing up and cannot keep down any medicines or water. This information is not intended to replace advice given to you by your health care provider. Make sure you discuss any questions you have with your health care provider. Document Released: 11/15/2007 Document Revised: 11/04/2015 Document Reviewed: 04/19/2015  2017 Elsevier  

## 2016-06-20 NOTE — Addendum Note (Signed)
Addended by: Jannifer RodneyHAWKS, CHRISTY A on: 06/20/2016 02:44 PM   Modules accepted: Orders

## 2016-06-25 LAB — URINE CULTURE

## 2016-06-26 ENCOUNTER — Ambulatory Visit (INDEPENDENT_AMBULATORY_CARE_PROVIDER_SITE_OTHER): Payer: Medicare Other | Admitting: Orthopedic Surgery

## 2016-06-26 ENCOUNTER — Encounter: Payer: Self-pay | Admitting: Orthopedic Surgery

## 2016-06-26 ENCOUNTER — Ambulatory Visit (INDEPENDENT_AMBULATORY_CARE_PROVIDER_SITE_OTHER): Payer: Medicare Other

## 2016-06-26 DIAGNOSIS — S82891D Other fracture of right lower leg, subsequent encounter for closed fracture with routine healing: Secondary | ICD-10-CM | POA: Diagnosis not present

## 2016-06-26 NOTE — Progress Notes (Signed)
Patient ID: [name] HammockSandra [name], female   DOB: 10/01/1950, 66 y.o.   MRN: [id number]  Chief Complaint  Patient presents with  . Follow-up    RIGHT ANKLE FRACTURE, DOS 03/08/16    HPI [name] is a 66 y.o. female.   HPI  Follow-up bimalleolar ankle fracture with multiple revisions medial malleolus patient complains of no pain currently an Aircast splint  Review of Systems Review of Systems  No complaints right ankle such as numbness or tingling   Physical Exam  Examination shows 10 of plantarflexion for 10 of dorsiflexion no tenderness no swelling stable ankle normal strength in plantarflexion and dorsiflexion skin healed nicely distal pulses are good mild edema normal sensation normal gait   MEDICAL DECISION MAKING  DATA   Intact ankle mortise on x-ray hardware intact medial malleolus healed  DIAGNOSIS  Encounter Diagnosis  Name Primary?  . Closed fracture of right ankle with routine healing, subsequent encounter Yes     PLAN(RISK)    Released remove ankle Aircast

## 2016-06-27 ENCOUNTER — Other Ambulatory Visit: Payer: Medicare Other | Admitting: Family Medicine

## 2016-07-05 ENCOUNTER — Encounter: Payer: Self-pay | Admitting: Family Medicine

## 2016-07-05 ENCOUNTER — Ambulatory Visit (INDEPENDENT_AMBULATORY_CARE_PROVIDER_SITE_OTHER): Payer: Medicare Other

## 2016-07-05 ENCOUNTER — Ambulatory Visit (INDEPENDENT_AMBULATORY_CARE_PROVIDER_SITE_OTHER): Payer: Medicare Other | Admitting: Family Medicine

## 2016-07-05 ENCOUNTER — Other Ambulatory Visit: Payer: Self-pay | Admitting: Family Medicine

## 2016-07-05 VITALS — BP 128/71 | HR 81 | Temp 97.2°F | Ht 65.0 in | Wt 214.0 lb

## 2016-07-05 DIAGNOSIS — Z6835 Body mass index (BMI) 35.0-35.9, adult: Secondary | ICD-10-CM | POA: Diagnosis not present

## 2016-07-05 DIAGNOSIS — N3090 Cystitis, unspecified without hematuria: Secondary | ICD-10-CM

## 2016-07-05 DIAGNOSIS — F1320 Sedative, hypnotic or anxiolytic dependence, uncomplicated: Secondary | ICD-10-CM | POA: Diagnosis not present

## 2016-07-05 DIAGNOSIS — F411 Generalized anxiety disorder: Secondary | ICD-10-CM

## 2016-07-05 DIAGNOSIS — Z780 Asymptomatic menopausal state: Secondary | ICD-10-CM | POA: Diagnosis not present

## 2016-07-05 DIAGNOSIS — F3341 Major depressive disorder, recurrent, in partial remission: Secondary | ICD-10-CM | POA: Diagnosis not present

## 2016-07-05 DIAGNOSIS — Z1231 Encounter for screening mammogram for malignant neoplasm of breast: Secondary | ICD-10-CM

## 2016-07-05 DIAGNOSIS — I517 Cardiomegaly: Secondary | ICD-10-CM | POA: Diagnosis not present

## 2016-07-05 DIAGNOSIS — E559 Vitamin D deficiency, unspecified: Secondary | ICD-10-CM

## 2016-07-05 DIAGNOSIS — M1990 Unspecified osteoarthritis, unspecified site: Secondary | ICD-10-CM | POA: Diagnosis not present

## 2016-07-05 DIAGNOSIS — I10 Essential (primary) hypertension: Secondary | ICD-10-CM

## 2016-07-05 DIAGNOSIS — E039 Hypothyroidism, unspecified: Secondary | ICD-10-CM | POA: Diagnosis not present

## 2016-07-05 DIAGNOSIS — I471 Supraventricular tachycardia: Secondary | ICD-10-CM

## 2016-07-05 DIAGNOSIS — IMO0001 Reserved for inherently not codable concepts without codable children: Secondary | ICD-10-CM

## 2016-07-05 DIAGNOSIS — E6609 Other obesity due to excess calories: Secondary | ICD-10-CM

## 2016-07-05 DIAGNOSIS — H60392 Other infective otitis externa, left ear: Secondary | ICD-10-CM

## 2016-07-05 LAB — URINALYSIS
BILIRUBIN UA: NEGATIVE
GLUCOSE, UA: NEGATIVE
Ketones, UA: NEGATIVE
LEUKOCYTES UA: NEGATIVE
Nitrite, UA: NEGATIVE
PROTEIN UA: NEGATIVE
RBC, UA: NEGATIVE
Specific Gravity, UA: 1.025 (ref 1.005–1.030)
Urobilinogen, Ur: 0.2 mg/dL (ref 0.2–1.0)
pH, UA: 5.5 (ref 5.0–7.5)

## 2016-07-05 MED ORDER — OFLOXACIN 0.3 % OT SOLN: 5.0000 [drp] | Freq: Every day | OTIC | 0 refills | Status: DC

## 2016-07-05 NOTE — Progress Notes (Signed)
Subjective:  Patient ID: [name], female    DOB: August 25, 1950  Age: 66 y.o. MRN: [id number]  CC: Annual Exam   HPI [name] presents for Annual exam. She questions the need for Pap smear since she's had a partial hysterectomy. She states that she is no longer having bladder symptoms. She does like to know the results of her culture. She was here earlier this month for that illness. She is still having some mild discomfort in the right ear, but she has run out of the ofloxacin drops. Patient in for follow-up of elevated cholesterol. Doing well without complaints on current medication. Denies side effects of statin including myalgia and arthralgia and nausea. Also in today for liver function testing. Currently no chest pain, shortness of breath or other cardiovascular related symptoms noted.   follow-up of hypertension. Patient has no history of headache chest pain or shortness of breath or recent cough. Patient also denies symptoms of TIA such as numbness weakness lateralizing. Patient denies side effects from his medication. States taking it regularly.  Patient presents for follow-up on  thyroid. The patient has a history of hypothyroidism for many years. It has been stable recently. Pt. denies any change in  voice, loss of hair, heat or cold intolerance. Energy level has been adequate to good. Patient denies constipation and diarrhea. No myxedema. Medication is as noted below. Verified that pt is taking it daily on an empty stomach. Well tolerated.  Patient continues to have anxiety symptoms. She continues to use medications including benzodiazepines on a regular basis. Review of the [place] controlled substance database shows that she has been getting fairly regular prescriptions for oxycodone from a Dr. Aline Brochure. This coincides with her ankle fracture and 2 subsequent surgeries.    History [name] has a past medical history of Anxiety; Arthritis; Borderline hypertension;  Chronic lower back pain; Chronic shoulder pain; Depression; Fe deficiency anemia; GERD (gastroesophageal reflux disease); Heartburn; Hiatal hernia; History of cardiac catheterization; History of mixed drug abuse; Hyperlipidemia; Hypertension; Hypothyroidism; Lumbar herniated disc; Microscopic hematuria; Obesity; Personal history of venous thrombosis and embolism; PSVT (paroxysmal supraventricular tachycardia) ([place]) (8/05); Recurrent cystitis; and Shortness of breath dyspnea.   She has a past surgical history that includes Appendectomy (2004?or 11/03?); Partial hysterectomy (2004); Right knee arthroscopy (8/05); Right knee replacement (8/05); Carpal tunnel release; Shoulder surgery; Rectocele repair (05/2001); Liver biopsy (2002); Bladder repair; Rectal prolapse repair; Back surgery; ORIF ankle fracture (Right, 02/02/2016); Hardware Removal (Right, 02/18/2016); ORIF ankle fracture (Right, 02/18/2016); and Hardware Removal (Right, 03/08/2016).   Her family history includes Breast cancer in her mother and sister.She reports that she has never smoked. She has never used smokeless tobacco. She reports that she does not drink alcohol or use drugs.    ROS Review of Systems  Constitutional: Negative for appetite change, chills, diaphoresis, fatigue, fever and unexpected weight change.  HENT: Positive for ear pain. Negative for congestion, hearing loss, postnasal drip, rhinorrhea, sneezing, sore throat and trouble swallowing.   Eyes: Negative for pain.  Respiratory: Negative for cough, chest tightness and shortness of breath.   Cardiovascular: Negative for chest pain and palpitations.  Gastrointestinal: Negative for abdominal pain, constipation, diarrhea, nausea and vomiting.  Endocrine: Negative for cold intolerance, heat intolerance, polydipsia, polyphagia and polyuria.  Genitourinary: Negative for dysuria, frequency and menstrual problem.  Musculoskeletal: Negative for arthralgias and joint swelling.  Skin:  Negative for rash.  Allergic/Immunologic: Negative for environmental allergies.  Neurological: Negative for dizziness, weakness, numbness and headaches.  Psychiatric/Behavioral:  Negative for agitation and dysphoric mood.    Objective:  BP 128/71   Pulse 81   Temp 97.2 F (36.2 C) (Oral)   Ht _0  (1.651 m)   Wt 214 lb (97.1 kg)   BMI 35.61 kg/m   BP Readings from Last 3 Encounters:  07/05/16 128/71  06/20/16 121/74  06/19/16 118/78    Wt Readings from Last 3 Encounters:  07/05/16 214 lb (97.1 kg)  06/20/16 211 lb (95.7 kg)  06/19/16 210 lb (95.3 kg)     Physical Exam  Constitutional: She is oriented to person, place, and time. She appears well-developed and well-nourished. No distress.  HENT:  Head: Normocephalic and atraumatic.  Right Ear: External ear normal.  Left Ear: External ear normal.  Nose: Nose normal.  Mouth/Throat: Oropharynx is clear and moist.  Eyes: Conjunctivae and EOM are normal. Pupils are equal, round, and reactive to light.  Neck: Normal range of motion. Neck supple. No thyromegaly present.  Cardiovascular: Normal rate, regular rhythm and normal heart sounds.   No murmur heard. Pulmonary/Chest: Effort normal and breath sounds normal. No respiratory distress. She has no wheezes. She has no rales. Right breast exhibits no inverted nipple, no mass and no tenderness. Left breast exhibits no inverted nipple, no mass and no tenderness. Breasts are symmetrical.  Abdominal: Soft. Normal appearance and bowel sounds are normal. She exhibits no distension, no abdominal bruit and no mass. There is no splenomegaly or hepatomegaly. There is no tenderness. There is no tenderness at McBurney's point and negative Murphy's sign.  Genitourinary: Rectum normal. Rectal exam shows no external hemorrhoid, no internal hemorrhoid, no mass and no tenderness. No breast swelling or tenderness.  Musculoskeletal: Normal range of motion. She exhibits no edema or tenderness.    Lymphadenopathy:    She has no cervical adenopathy.  Neurological: She is alert and oriented to person, place, and time. She has normal reflexes.  Skin: Skin is warm and dry. No rash noted.  Psychiatric: She has a normal mood and affect. Her behavior is normal. Judgment and thought content normal.    No results found.  Assessment & Plan:   [name] was seen today for annual exam.  Diagnoses and all orders for this visit:  Essential hypertension, benign -     CBC with Differential/Platelet -     CMP14+EGFR -     Urinalysis  PSVT (paroxysmal supraventricular tachycardia) (HCC) -     CBC with Differential/Platelet -     CMP14+EGFR  Class 2 obesity due to excess calories with serious comorbidity and body mass index (BMI) of 35.0 to 35.9 in adult -     CBC with Differential/Platelet -     CMP14+EGFR  Vitamin D deficiency -     CBC with Differential/Platelet -     CMP14+EGFR -     VITAMIN D 25 Hydroxy (Vit-D Deficiency, Fractures)  Hypothyroidism, unspecified type -     CBC with Differential/Platelet -     CMP14+EGFR -     Lipid panel -     Urinalysis -     Urine culture -     DG Bone Density; Future -     VITAMIN D 25 Hydroxy (Vit-D Deficiency, Fractures) -     TSH + free T4  LVH (left ventricular hypertrophy) -     CBC with Differential/Platelet -     CMP14+EGFR  Arthritis -     CBC with Differential/Platelet -     CMP14+EGFR  Recurrent major  depressive disorder, in partial remission (HCC) -     CBC with Differential/Platelet -     CMP14+EGFR  Benzodiazepine dependence (HCC)  GAD (generalized anxiety disorder) -     CBC with Differential/Platelet -     CMP14+EGFR  Cystitis -     Urine culture  Post-menopause -     DG Bone Density; Future  Infective otitis externa of left ear -     ofloxacin (FLOXIN OTIC) 0.3 % otic solution; Place 5 drops into the left ear daily.  I have discontinued Ms. Slowey's chlorthalidone, sulfamethoxazole-trimethoprim, and  nitrofurantoin (macrocrystal-monohydrate). I am also having her maintain her Cholecalciferol (VITAMIN D-3 PO), omega-3 acid ethyl esters, Calcium Carbonate-Vitamin D, aspirin EC, Ospemifene, triamterene-hydrochlorothiazide, ezetimibe-simvastatin, metoprolol succinate, RESTASIS, AMITIZA, omeprazole, hydrocortisone, PREMARIN, furosemide, clonazePAM, potassium chloride SA, venlafaxine XR, cyclobenzaprine, atorvastatin, levothyroxine, potassium chloride, and ofloxacin.  Allergies as of 07/05/2016      Reactions   Clindamycin    REACTION: caused C-diff per patient   Diphenhydramine Hcl Other (See Comments)   REACTION:  Severely drowsy   Naproxen    Burning in lower abdominal area.      Medication List       Accurate as of 07/05/16  3:06 PM. Always use your most recent med list.          AMITIZA 24 MCG capsule Generic drug:  lubiprostone TAKE ONE CAPSULE BY MOUTH TWICE A DAY   aspirin EC 81 MG tablet Take 81 mg by mouth daily.   atorvastatin 80 MG tablet Commonly known as:  LIPITOR TAKE ONE (1) TABLET EACH DAY   CALCIUM 500 + D 500-125 MG-UNIT Tabs Generic drug:  Calcium Carbonate-Vitamin D Take by mouth. Take 1 tablet by mouth every morning.   clonazePAM 2 MG tablet Commonly known as:  KLONOPIN TAKE 1 TABLET TWICE DAILY AS NEEDED FOR ANXIETY   cyclobenzaprine 10 MG tablet Commonly known as:  FLEXERIL TAKE ONE TABLET BY MOUTH THREE TIMES DAILY AS NEEDED FOR MUSCLE SPASM   ezetimibe-simvastatin 10-80 MG tablet Commonly known as:  VYTORIN Take 1 tablet by mouth daily.   furosemide 20 MG tablet Commonly known as:  LASIX TAKE ONE (1) TABLET EACH DAY   hydrocortisone 1 % lotion Apply 1 application topically 2 (two) times daily.   levothyroxine 75 MCG tablet Commonly known as:  SYNTHROID, LEVOTHROID TAKE ONE (1) TABLET EACH DAY   metoprolol succinate 50 MG 24 hr tablet Commonly known as:  TOPROL XL Take 1 tablet (50 mg total) by mouth 2 (two) times daily. Take with or  immediately following a meal.   ofloxacin 0.3 % otic solution Commonly known as:  FLOXIN OTIC Place 5 drops into the left ear daily.   omega-3 acid ethyl esters 1 g capsule Commonly known as:  LOVAZA Take 1 g by mouth 2 (two) times daily.   omeprazole 20 MG capsule Commonly known as:  PRILOSEC TAKE ONE CAPSULE BY MOUTH TWICE A DAY   OSPHENA 60 MG Tabs Generic drug:  Ospemifene Take 1 tablet by mouth daily. Reported on 06/29/2015   potassium chloride 10 MEQ tablet Commonly known as:  K-DUR TAKE FIVE TABLETS TWICE DAILY   potassium chloride SA 20 MEQ tablet Commonly known as:  K-DUR,KLOR-CON Take 2 tablets in the morning and 3 in the evening   PREMARIN vaginal cream Generic drug:  conjugated estrogens APPLY ONCE WEEKLY   RESTASIS 0.05 % ophthalmic emulsion Generic drug:  cycloSPORINE INSTILL ONE DROP IN BOTH EYES TWICE DAILY  triamterene-hydrochlorothiazide 37.5-25 MG tablet Commonly known as:  MAXZIDE-25 Take 1 tablet by mouth daily.   venlafaxine XR 75 MG 24 hr capsule Commonly known as:  EFFEXOR-XR TAKE 3 CAPSULES EVERY MORNING   VITAMIN D-3 PO Take 1 capsule by mouth every morning.        Follow-up: Return in about 3 months (around 10/03/2016) for hypertension, Depression, Arthritis, Pain.  Claretta Fraise, [name]

## 2016-07-06 ENCOUNTER — Other Ambulatory Visit: Payer: Self-pay | Admitting: Family Medicine

## 2016-07-06 LAB — LIPID PANEL
CHOLESTEROL TOTAL: 326 mg/dL — AB (ref 100–199)
Chol/HDL Ratio: 5.4 ratio units — ABNORMAL HIGH (ref 0.0–4.4)
HDL: 60 mg/dL (ref >39)
LDL Calculated: 226 mg/dL — ABNORMAL HIGH (ref 0–99)
Triglycerides: 202 mg/dL — ABNORMAL HIGH (ref 0–149)
VLDL CHOLESTEROL CAL: 40 mg/dL (ref 5–40)

## 2016-07-06 LAB — CMP14+EGFR
A/G RATIO: 1.4 (ref 1.2–2.2)
ALK PHOS: 93 IU/L (ref 39–117)
ALT: 16 IU/L (ref 0–32)
AST: 16 IU/L (ref 0–40)
Albumin: 4.2 g/dL (ref 3.6–4.8)
BILIRUBIN TOTAL: 0.3 mg/dL (ref 0.0–1.2)
BUN/Creatinine Ratio: 23 (ref 12–28)
BUN: 22 mg/dL (ref 8–27)
CHLORIDE: 97 mmol/L (ref 96–106)
CO2: 28 mmol/L (ref 18–29)
Calcium: 10 mg/dL (ref 8.7–10.3)
Creatinine, Ser: 0.97 mg/dL (ref 0.57–1.00)
GFR calc Af Amer: 71 mL/min/{1.73_m2} (ref >59)
GFR calc non Af Amer: 61 mL/min/{1.73_m2} (ref >59)
GLUCOSE: 86 mg/dL (ref 65–99)
Globulin, Total: 3.1 g/dL (ref 1.5–4.5)
POTASSIUM: 3.4 mmol/L — AB (ref 3.5–5.2)
Sodium: 142 mmol/L (ref 134–144)
TOTAL PROTEIN: 7.3 g/dL (ref 6.0–8.5)

## 2016-07-06 LAB — CBC WITH DIFFERENTIAL/PLATELET
BASOS ABS: 0 10*3/uL (ref 0.0–0.2)
Basos: 0 %
EOS (ABSOLUTE): 0.1 10*3/uL (ref 0.0–0.4)
Eos: 1 %
Hematocrit: 41.4 % (ref 34.0–46.6)
Hemoglobin: 13.9 g/dL (ref 11.1–15.9)
IMMATURE GRANS (ABS): 0 10*3/uL (ref 0.0–0.1)
Immature Granulocytes: 0 %
LYMPHS: 36 %
Lymphocytes Absolute: 3.5 10*3/uL — ABNORMAL HIGH (ref 0.7–3.1)
MCH: 29.3 pg (ref 26.6–33.0)
MCHC: 33.6 g/dL (ref 31.5–35.7)
MCV: 87 fL (ref 79–97)
MONOS ABS: 0.8 10*3/uL (ref 0.1–0.9)
Monocytes: 9 %
NEUTROS ABS: 5.2 10*3/uL (ref 1.4–7.0)
Neutrophils: 54 %
PLATELETS: 281 10*3/uL (ref 150–379)
RBC: 4.75 x10E6/uL (ref 3.77–5.28)
RDW: 13.8 % (ref 12.3–15.4)
WBC: 9.7 10*3/uL (ref 3.4–10.8)

## 2016-07-06 LAB — VITAMIN D 25 HYDROXY (VIT D DEFICIENCY, FRACTURES): VIT D 25 HYDROXY: 41.7 ng/mL (ref 30.0–100.0)

## 2016-07-06 LAB — TSH+FREE T4
Free T4: 1.45 ng/dL (ref 0.82–1.77)
TSH: 0.782 u[IU]/mL (ref 0.450–4.500)

## 2016-07-06 MED ORDER — AMOXICILLIN 500 MG PO CAPS: ORAL_CAPSULE | ORAL | 3 refills | Status: DC

## 2016-07-06 NOTE — Telephone Encounter (Signed)
I sent in the requested prescription 

## 2016-07-06 NOTE — Telephone Encounter (Signed)
Patient aware that medication has been sent to the pharmacy 

## 2016-07-07 ENCOUNTER — Telehealth: Payer: Self-pay | Admitting: Family Medicine

## 2016-07-07 LAB — URINE CULTURE

## 2016-07-10 ENCOUNTER — Other Ambulatory Visit: Payer: Self-pay | Admitting: *Deleted

## 2016-07-10 NOTE — Telephone Encounter (Signed)
Called patient with lab results.  

## 2016-07-13 ENCOUNTER — Other Ambulatory Visit: Payer: Self-pay | Admitting: Family Medicine

## 2016-07-14 ENCOUNTER — Encounter: Payer: Self-pay | Admitting: *Deleted

## 2016-07-17 ENCOUNTER — Ambulatory Visit: Payer: Medicaid Other | Admitting: Cardiology

## 2016-07-17 NOTE — Progress Notes (Deleted)
Cardiology Office Note  Date: 07/17/2016   ID: [name], DOB 1950-10-14, MRN [id number]  PCP: Mechele Claude, MD  Primary Cardiologist: Nona Dell, MD   No chief complaint on file.   History of Present Illness: [name] is a 66 y.o. female last seen in the office in December 2016.  Past Medical History:  Diagnosis Date  . Anxiety   . Arthritis   . Borderline hypertension   . Chronic lower back pain   . Chronic shoulder pain   . Depression   . Fe deficiency anemia   . GERD (gastroesophageal reflux disease)   . Heartburn   . Hiatal hernia   . History of cardiac catheterization    Normal coronaries  . History of mixed drug abuse   . Hyperlipidemia   . Hypertension   . Hypothyroidism    [name]   . Lumbar herniated disc    L4-L5   . Microscopic hematuria   . Obesity   . Personal history of venous thrombosis and embolism   . PSVT (paroxysmal supraventricular tachycardia) (HCC) 8/05  . Recurrent cystitis    Dr. Aldean Ast   . Shortness of breath dyspnea     Past Surgical History:  Procedure Laterality Date  . APPENDECTOMY  2004?or 11/03?  [name] BACK SURGERY    . BLADDER REPAIR    . CARPAL TUNNEL RELEASE     x2   . HARDWARE REMOVAL Right 02/18/2016   Procedure: HARDWARE REMOVAL;  Surgeon: Vickki Hearing, MD;  Location: [facility];  Service: Orthopedics;  Laterality: Right;  right ankle medial malleolar screw  . HARDWARE REMOVAL Right 03/08/2016   Procedure: HARDWARE REMOVAL AND INTERNAL FIXATION OF RIGHT MEDIAL MALLEOLUS  - RIGHT ANKLE REMOVAL OF MALLEOLAR SCREW, INSERTION OF 2  0.45 K WIRES;  Surgeon: Vickki Hearing, MD;  Location: [facility];  Service: Orthopedics;  Laterality: Right;  RIGHT ANKLE REMOVAL OF MALLEOLAR SCREW  . LIVER BIOPSY  2002   Dr. Katrinka Blazing   . ORIF ANKLE FRACTURE Right 02/02/2016   Procedure: OPEN REDUCTION INTERNAL FIXATION (ORIF) ANKLE FRACTURE;  Surgeon: Vickki Hearing, MD;  Location: [facility];  Service: Orthopedics;  Laterality:  Right;  . ORIF ANKLE FRACTURE Right 02/18/2016   Procedure: OPEN REDUCTION INTERNAL FIXATION (ORIF) ANKLE FRACTURE;  Surgeon: Vickki Hearing, MD;  Location: [facility];  Service: Orthopedics;  Laterality: Right;  reinsertion of medial malleolar screw  . PARTIAL HYSTERECTOMY  2004   One ovary   . RECTAL PROLAPSE REPAIR    . RECTOCELE REPAIR  05/2001  . Right knee arthroscopy  8/05  . Right knee replacement  8/05  . SHOULDER SURGERY      Current Outpatient Prescriptions  Medication Sig Dispense Refill  . AMITIZA 24 MCG capsule TAKE ONE CAPSULE BY MOUTH TWICE A DAY 60 capsule 5  . amoxicillin (AMOXIL) 500 MG capsule 4 po 1 hour before dental procedure 8 capsule 3  . aspirin EC 81 MG tablet Take 81 mg by mouth daily.     [name] atorvastatin (LIPITOR) 80 MG tablet TAKE ONE (1) TABLET EACH DAY 90 tablet 1  . Calcium Carbonate-Vitamin D (CALCIUM 500 + D) 500-125 MG-UNIT TABS Take by mouth. Take 1 tablet by mouth every morning.    . chlorthalidone (HYGROTON) 25 MG tablet TAKE ONE TABLET BY MOUTH TWICE DAILY 60 tablet 5  . Cholecalciferol (VITAMIN D-3 PO) Take 1 capsule by mouth every morning.     . clonazePAM (KLONOPIN) 2 MG  tablet TAKE 1 TABLET TWICE DAILY AS NEEDED FOR ANXIETY 60 tablet 5  . cyclobenzaprine (FLEXERIL) 10 MG tablet TAKE ONE TABLET BY MOUTH THREE TIMES DAILY AS NEEDED FOR MUSCLE SPASM 90 tablet 5  . ezetimibe-simvastatin (VYTORIN) 10-80 MG tablet Take 1 tablet by mouth daily.    . furosemide (LASIX) 20 MG tablet TAKE ONE (1) TABLET EACH DAY 30 tablet 3  . hydrocortisone 1 % lotion Apply 1 application topically 2 (two) times daily. 118 mL 0  . levothyroxine (SYNTHROID, LEVOTHROID) 75 MCG tablet TAKE ONE (1) TABLET EACH DAY 90 tablet 3  . metoprolol succinate (TOPROL XL) 50 MG 24 hr tablet Take 1 tablet (50 mg total) by mouth 2 (two) times daily. Take with or immediately following a meal. 180 tablet 3  . ofloxacin (FLOXIN OTIC) 0.3 % otic solution Place 5 drops into the left ear daily. 5  mL 0  . omega-3 acid ethyl esters (LOVAZA) 1 G capsule Take 1 g by mouth 2 (two) times daily.    [name] omeprazole (PRILOSEC) 20 MG capsule TAKE ONE CAPSULE BY MOUTH TWICE A DAY 60 capsule 5  . Ospemifene (OSPHENA) 60 MG TABS Take 1 tablet by mouth daily. Reported on 06/29/2015    . potassium chloride (K-DUR) 10 MEQ tablet TAKE FIVE TABLETS TWICE DAILY 300 tablet 2  . potassium chloride SA (K-DUR,KLOR-CON) 20 MEQ tablet Take 2 tablets in the morning and 3 in the evening 150 tablet 4  . PREMARIN vaginal cream APPLY ONCE WEEKLY 30 g 3  . RESTASIS 0.05 % ophthalmic emulsion INSTILL ONE DROP IN BOTH EYES TWICE DAILY 60 each 11  . triamterene-hydrochlorothiazide (MAXZIDE-25) 37.5-25 MG tablet Take 1 tablet by mouth daily.    [name] venlafaxine XR (EFFEXOR-XR) 75 MG 24 hr capsule TAKE 3 CAPSULES EVERY MORNING 90 capsule 2   No current facility-administered medications for this visit.    Allergies:  Clindamycin; Diphenhydramine hcl; and Naproxen   Social History: The patient  reports that she has never smoked. She has never used smokeless tobacco. She reports that she does not drink alcohol or use drugs.   Family History: The patient's family history includes Breast cancer in her mother and sister.   ROS:  Please see the history of present illness. Otherwise, complete review of systems is positive for {NONE DEFAULTED:18576::"none"}.  All other systems are reviewed and negative.   Physical Exam: VS:  There were no vitals taken for this visit., BMI There is no height or weight on file to calculate BMI.  Wt Readings from Last 3 Encounters:  07/05/16 214 lb (97.1 kg)  06/20/16 211 lb (95.7 kg)  06/19/16 210 lb (95.3 kg)    Obese woman, appears comfortable at rest. HEENT: Conjunctiva and lids normal, oropharynx clear. Neck: Supple, no elevated JVP or carotid bruits, no thyromegaly. Lungs: Clear to auscultation, nonlabored breathing at rest. Cardiac: Regular rate and rhythm, no S3 or significant systolic  murmur, no pericardial rub. Extremities: No pitting edema, distal pulses 2+.  ECG: I personally reviewed the tracing from 06/11/2015 which showed sinus rhythm with right bundle branch block.  Recent Labwork: 02/03/2016: Hemoglobin 12.5 07/05/2016: ALT 16; AST 16; BUN 22; Creatinine, Ser 0.97; Platelets 281; Potassium 3.4; Sodium 142; TSH 0.782     Component Value Date/Time   CHOL 326 (H) 07/05/2016 1459   CHOL 166 01/02/2013 1146   TRIG 202 (H) 07/05/2016 1459   TRIG 122 10/26/2014 1655   TRIG 103 01/02/2013 1146   HDL 60 07/05/2016  1459   HDL 72 10/26/2014 1655   HDL 75 01/02/2013 1146   CHOLHDL 5.4 (H) 07/05/2016 1459   LDLCALC 226 (H) 07/05/2016 1459   LDLCALC 57 12/09/2013 1048   LDLCALC 70 01/02/2013 1146    Other Studies Reviewed Today:  Echocardiogram 12/19/2012: Study Conclusions  - Left ventricle: There was mild concentric hypertrophy. Systolic function was normal. The estimated ejection fraction was in the range of 55% to 60%. Left ventricular diastolic function parameters were normal. - Left atrium: The atrium was mildly dilated. - Tricuspid valve: Mild regurgitation.  Assessment and Plan:    Current medicines were reviewed with the patient today.  No orders of the defined types were placed in this encounter.   Disposition:  Signed, Jonelle SidleSamuel G. Tanara Turvey, MD, [facility] HospitalFACC 07/17/2016 8:47 AM    [facility] [address], [place], [place] [address] Phone: [phone]; Fax: [phone]

## 2016-07-18 ENCOUNTER — Encounter: Payer: Medicare Other | Admitting: Pharmacist

## 2016-07-19 ENCOUNTER — Encounter: Payer: Self-pay | Admitting: Family Medicine

## 2016-07-24 ENCOUNTER — Ambulatory Visit: Payer: Self-pay | Admitting: Pharmacist

## 2016-08-07 ENCOUNTER — Ambulatory Visit: Payer: Medicaid Other

## 2016-08-09 ENCOUNTER — Other Ambulatory Visit: Payer: Self-pay | Admitting: Cardiology

## 2016-08-09 ENCOUNTER — Other Ambulatory Visit: Payer: Self-pay | Admitting: Family Medicine

## 2016-08-21 ENCOUNTER — Encounter: Payer: Self-pay | Admitting: *Deleted

## 2016-08-21 NOTE — Progress Notes (Deleted)
Cardiology Office Note  Date: 08/21/2016   ID: [name], DOB 12/08/1950, MRN [id number]  PCP: No primary care provider on file.  Primary Cardiologist: Nona DellSamuel Nickalas Mccarrick, MD   No chief complaint on file.   History of Present Illness: [name] is a 66 y.o. female last seen in December 2016.  Lipid control deteriorated significantly based on recent follow-up lab work per PCP. We discussed her medications.  Past Medical History:  Diagnosis Date  . Anxiety   . Arthritis   . Borderline hypertension   . Chronic lower back pain   . Chronic shoulder pain   . Depression   . Fe deficiency anemia   . GERD (gastroesophageal reflux disease)   . Heartburn   . Hiatal hernia   . History of cardiac catheterization    Normal coronaries  . History of mixed drug abuse   . Hyperlipidemia   . Hypertension   . Hypothyroidism    Dr. Annie MainBaler   . Lumbar herniated disc    L4-L5   . Microscopic hematuria   . Obesity   . Personal history of venous thrombosis and embolism   . PSVT (paroxysmal supraventricular tachycardia) (HCC) 8/05  . Recurrent cystitis    Dr. Aldean AstKimbrough   . Shortness of breath dyspnea     Past Surgical History:  Procedure Laterality Date  . APPENDECTOMY  2004?or 11/03?  [name]. BACK SURGERY    . BLADDER REPAIR    . CARPAL TUNNEL RELEASE     x2   . HARDWARE REMOVAL Right 02/18/2016   Procedure: HARDWARE REMOVAL;  Surgeon: Vickki HearingStanley E Harrison, MD;  Location: [facility];  Service: Orthopedics;  Laterality: Right;  right ankle medial malleolar screw  . HARDWARE REMOVAL Right 03/08/2016   Procedure: HARDWARE REMOVAL AND INTERNAL FIXATION OF RIGHT MEDIAL MALLEOLUS  - RIGHT ANKLE REMOVAL OF MALLEOLAR SCREW, INSERTION OF 2  0.45 K WIRES;  Surgeon: Vickki HearingStanley E Harrison, MD;  Location: [facility];  Service: Orthopedics;  Laterality: Right;  RIGHT ANKLE REMOVAL OF MALLEOLAR SCREW  . LIVER BIOPSY  2002   Dr. Katrinka BlazingSmith   . ORIF ANKLE FRACTURE Right 02/02/2016   Procedure: OPEN REDUCTION INTERNAL  FIXATION (ORIF) ANKLE FRACTURE;  Surgeon: Vickki HearingStanley E Harrison, MD;  Location: [facility];  Service: Orthopedics;  Laterality: Right;  . ORIF ANKLE FRACTURE Right 02/18/2016   Procedure: OPEN REDUCTION INTERNAL FIXATION (ORIF) ANKLE FRACTURE;  Surgeon: Vickki HearingStanley E Harrison, MD;  Location: [facility];  Service: Orthopedics;  Laterality: Right;  reinsertion of medial malleolar screw  . PARTIAL HYSTERECTOMY  2004   One ovary   . RECTAL PROLAPSE REPAIR    . RECTOCELE REPAIR  05/2001  . Right knee arthroscopy  8/05  . Right knee replacement  8/05  . SHOULDER SURGERY      Current Outpatient Prescriptions  Medication Sig Dispense Refill  . AMITIZA 24 MCG capsule TAKE ONE CAPSULE BY MOUTH TWICE A DAY 60 capsule 5  . amoxicillin (AMOXIL) 500 MG capsule 4 po 1 hour before dental procedure 8 capsule 3  . aspirin EC 81 MG tablet Take 81 mg by mouth daily.     [name]. atorvastatin (LIPITOR) 80 MG tablet TAKE ONE (1) TABLET EACH DAY 90 tablet 1  . Calcium Carbonate-Vitamin D (CALCIUM 500 + D) 500-125 MG-UNIT TABS Take by mouth. Take 1 tablet by mouth every morning.    . chlorthalidone (HYGROTON) 25 MG tablet TAKE ONE TABLET BY MOUTH TWICE DAILY 60 tablet 5  . Cholecalciferol (VITAMIN  D-3 PO) Take 1 capsule by mouth every morning.     . clonazePAM (KLONOPIN) 2 MG tablet TAKE 1 TABLET TWICE DAILY AS NEEDED FOR ANXIETY 60 tablet 5  . cyclobenzaprine (FLEXERIL) 10 MG tablet TAKE ONE TABLET BY MOUTH THREE TIMES DAILY AS NEEDED FOR MUSCLE SPASM 90 tablet 5  . ezetimibe-simvastatin (VYTORIN) 10-80 MG tablet Take 1 tablet by mouth daily.    . furosemide (LASIX) 20 MG tablet TAKE ONE (1) TABLET EACH DAY 30 tablet 2  . hydrocortisone 1 % lotion Apply 1 application topically 2 (two) times daily. 118 mL 0  . levothyroxine (SYNTHROID, LEVOTHROID) 75 MCG tablet TAKE ONE (1) TABLET EACH DAY 90 tablet 3  . metoprolol succinate (TOPROL-XL) 50 MG 24 hr tablet TAKE ONE TABLET BY MOUTH TWICE DAILY 180 tablet 0  . ofloxacin (FLOXIN OTIC) 0.3  % otic solution Place 5 drops into the left ear daily. 5 mL 0  . omega-3 acid ethyl esters (LOVAZA) 1 G capsule Take 1 g by mouth 2 (two) times daily.    [name] omeprazole (PRILOSEC) 20 MG capsule TAKE ONE CAPSULE BY MOUTH TWICE A DAY 60 capsule 5  . Ospemifene (OSPHENA) 60 MG TABS Take 1 tablet by mouth daily. Reported on 06/29/2015    . potassium chloride (K-DUR) 10 MEQ tablet TAKE FIVE TABLETS TWICE DAILY 300 tablet 2  . potassium chloride SA (K-DUR,KLOR-CON) 20 MEQ tablet Take 2 tablets in the morning and 3 in the evening 150 tablet 4  . PREMARIN vaginal cream APPLY ONCE WEEKLY 30 g 3  . RESTASIS 0.05 % ophthalmic emulsion INSTILL ONE DROP IN BOTH EYES TWICE DAILY 60 each 11  . triamterene-hydrochlorothiazide (MAXZIDE-25) 37.5-25 MG tablet Take 1 tablet by mouth daily.    [name] venlafaxine XR (EFFEXOR-XR) 75 MG 24 hr capsule TAKE 3 CAPSULES EVERY MORNING 90 capsule 1   No current facility-administered medications for this visit.    Allergies:  Clindamycin; Diphenhydramine hcl; and Naproxen   Social History: The patient  reports that she has never smoked. She has never used smokeless tobacco. She reports that she does not drink alcohol or use drugs.   Family History: The patient's family history includes Breast cancer in her mother and sister.   ROS:  Please see the history of present illness. Otherwise, complete review of systems is positive for {NONE DEFAULTED:18576::"none"}.  All other systems are reviewed and negative.   Physical Exam: VS:  There were no vitals taken for this visit., BMI There is no height or weight on file to calculate BMI.  Wt Readings from Last 3 Encounters:  07/05/16 214 lb (97.1 kg)  06/20/16 211 lb (95.7 kg)  06/19/16 210 lb (95.3 kg)    Obese woman, appears comfortable at rest. HEENT: Conjunctiva and lids normal, oropharynx clear. Neck: Supple, no elevated JVP or carotid bruits, no thyromegaly. Lungs: Clear to auscultation, nonlabored breathing at rest. Cardiac:  Regular rate and rhythm, no S3 or significant systolic murmur, no pericardial rub. Extremities: No pitting edema, distal pulses 2+.  ECG: I personally reviewed the tracing from 06/11/2015 which showed sinus rhythm with right bundle branch block.  Recent Labwork: 02/03/2016: Hemoglobin 12.5 07/05/2016: ALT 16; AST 16; BUN 22; Creatinine, Ser 0.97; Platelets 281; Potassium 3.4; Sodium 142; TSH 0.782     Component Value Date/Time   CHOL 326 (H) 07/05/2016 1459   CHOL 166 01/02/2013 1146   TRIG 202 (H) 07/05/2016 1459   TRIG 122 10/26/2014 1655   TRIG 103 01/02/2013 1146  HDL 60 07/05/2016 1459   HDL 72 10/26/2014 1655   HDL 75 01/02/2013 1146   CHOLHDL 5.4 (H) 07/05/2016 1459   LDLCALC 226 (H) 07/05/2016 1459   LDLCALC 57 12/09/2013 1048   LDLCALC 70 01/02/2013 1146    Other Studies Reviewed Today:  Echocardiogram 12/19/2012: Study Conclusions  - Left ventricle: There was mild concentric hypertrophy. Systolic function was normal. The estimated ejection fraction was in the range of 55% to 60%. Left ventricular diastolic function parameters were normal. - Left atrium: The atrium was mildly dilated. - Tricuspid valve: Mild regurgitation.  Assessment and Plan:    Current medicines were reviewed with the patient today.  No orders of the defined types were placed in this encounter.   Disposition:  Signed, Jonelle Sidle, MD, [facility] 08/21/2016 12:57 PM    [facility] at [facility] [address] [place], [place], [place] [address] Phone: [phone]; Fax: [phone]

## 2016-08-22 ENCOUNTER — Ambulatory Visit: Payer: Medicare Other | Admitting: Cardiology

## 2016-08-25 ENCOUNTER — Ambulatory Visit: Payer: Medicaid Other

## 2016-08-28 ENCOUNTER — Telehealth: Payer: Self-pay | Admitting: Orthopedic Surgery

## 2016-08-28 NOTE — Telephone Encounter (Signed)
Patient had left a voice message requesting appointment for foot.  Called back; left voice message to return call to schedule.  Ph#'s [phone] or [phone].

## 2016-08-31 NOTE — Telephone Encounter (Signed)
No further response. 

## 2016-09-08 ENCOUNTER — Other Ambulatory Visit: Payer: Self-pay | Admitting: Family Medicine

## 2016-09-08 ENCOUNTER — Other Ambulatory Visit: Payer: Self-pay | Admitting: Cardiology

## 2016-09-13 ENCOUNTER — Ambulatory Visit: Payer: Medicaid Other

## 2016-09-14 ENCOUNTER — Ambulatory Visit: Payer: Medicaid Other

## 2016-10-02 ENCOUNTER — Ambulatory Visit (INDEPENDENT_AMBULATORY_CARE_PROVIDER_SITE_OTHER): Payer: Medicare Other | Admitting: Orthopedic Surgery

## 2016-10-02 ENCOUNTER — Encounter: Payer: Self-pay | Admitting: Orthopedic Surgery

## 2016-10-02 ENCOUNTER — Ambulatory Visit (INDEPENDENT_AMBULATORY_CARE_PROVIDER_SITE_OTHER): Payer: Medicare Other

## 2016-10-02 VITALS — BP 131/65 | HR 62 | Wt 220.0 lb

## 2016-10-02 DIAGNOSIS — M25571 Pain in right ankle and joints of right foot: Secondary | ICD-10-CM | POA: Diagnosis not present

## 2016-10-02 DIAGNOSIS — M19079 Primary osteoarthritis, unspecified ankle and foot: Secondary | ICD-10-CM | POA: Diagnosis not present

## 2016-10-02 DIAGNOSIS — M19171 Post-traumatic osteoarthritis, right ankle and foot: Secondary | ICD-10-CM

## 2016-10-02 DIAGNOSIS — S82891S Other fracture of right lower leg, sequela: Secondary | ICD-10-CM | POA: Diagnosis not present

## 2016-10-02 MED ORDER — DICLOFENAC POTASSIUM 50 MG PO TABS: 50.0000 mg | ORAL_TABLET | Freq: Two times a day (BID) | ORAL | 5 refills | Status: DC

## 2016-10-02 NOTE — Progress Notes (Signed)
Patient ID: [name], female   DOB: Aug 13, 1950, 66 y.o.   MRN: [id number]  Chief Complaint  Patient presents with  . Ankle Pain    RIGHT ANKLE PAIN AND SWELLING, ORIF 12/30/15    HPI [name] is a 66 y.o. female.Presents for reevaluation of her right ankle. She had a right ankle fracture July 2017 had open treatment internal fixation on August 23 had a hardware removal on September 8 with re-internal fixation of the medial malleolus and then had that revised again on the knot on September 27  Comes in complaining of pain swelling and loss of motion for the last 2 weeks. She's had anterior and lateral ankle pain moderate with swelling  Review of Systems Review of Systems  Constitutional: Negative for fever.  Skin: Negative for color change.   (2 MINIMUM)  Past Medical History:  Diagnosis Date  . Anxiety   . Arthritis   . Borderline hypertension   . Chronic lower back pain   . Chronic shoulder pain   . Depression   . Fe deficiency anemia   . GERD (gastroesophageal reflux disease)   . Heartburn   . Hiatal hernia   . History of cardiac catheterization    Normal coronaries  . History of mixed drug abuse   . Hyperlipidemia   . Hypertension   . Hypothyroidism    [name]   . Lumbar herniated disc    L4-L5   . Microscopic hematuria   . Obesity   . Personal history of venous thrombosis and embolism   . PSVT (paroxysmal supraventricular tachycardia) (HCC) 8/05  . Recurrent cystitis    Dr. Aldean Ast   . Shortness of breath dyspnea     Past Surgical History:  Procedure Laterality Date  . APPENDECTOMY  2004?or 11/03?  [name] BACK SURGERY    . BLADDER REPAIR    . CARPAL TUNNEL RELEASE     x2   . HARDWARE REMOVAL Right 02/18/2016   Procedure: HARDWARE REMOVAL;  Surgeon: Vickki Hearing, MD;  Location: [facility];  Service: Orthopedics;  Laterality: Right;  right ankle medial malleolar screw  . HARDWARE REMOVAL Right 03/08/2016   Procedure: HARDWARE REMOVAL AND INTERNAL  FIXATION OF RIGHT MEDIAL MALLEOLUS  - RIGHT ANKLE REMOVAL OF MALLEOLAR SCREW, INSERTION OF 2  0.45 K WIRES;  Surgeon: Vickki Hearing, MD;  Location: [facility];  Service: Orthopedics;  Laterality: Right;  RIGHT ANKLE REMOVAL OF MALLEOLAR SCREW  . LIVER BIOPSY  2002   Dr. Katrinka Blazing   . ORIF ANKLE FRACTURE Right 02/02/2016   Procedure: OPEN REDUCTION INTERNAL FIXATION (ORIF) ANKLE FRACTURE;  Surgeon: Vickki Hearing, MD;  Location: [facility];  Service: Orthopedics;  Laterality: Right;  . ORIF ANKLE FRACTURE Right 02/18/2016   Procedure: OPEN REDUCTION INTERNAL FIXATION (ORIF) ANKLE FRACTURE;  Surgeon: Vickki Hearing, MD;  Location: [facility];  Service: Orthopedics;  Laterality: Right;  reinsertion of medial malleolar screw  . PARTIAL HYSTERECTOMY  2004   One ovary   . RECTAL PROLAPSE REPAIR    . RECTOCELE REPAIR  05/2001  . Right knee arthroscopy  8/05  . Right knee replacement  8/05  . SHOULDER SURGERY      Social History Social History  Substance Use Topics  . Smoking status: Never Smoker  . Smokeless tobacco: Never Used  . Alcohol use No    Allergies  Allergen Reactions  . Clindamycin     REACTION: caused C-diff per patient  . Diphenhydramine Hcl Other (  See Comments)    REACTION:  Severely drowsy  . Naproxen     Burning in lower abdominal area.    Current Meds  Medication Sig  . AMITIZA 24 MCG capsule TAKE ONE CAPSULE BY MOUTH TWICE A DAY  . amoxicillin (AMOXIL) 500 MG capsule 4 po 1 hour before dental procedure  . aspirin EC 81 MG tablet Take 81 mg by mouth daily.   [name] atorvastatin (LIPITOR) 80 MG tablet TAKE ONE (1) TABLET EACH DAY  . Calcium Carbonate-Vitamin D (CALCIUM 500 + D) 500-125 MG-UNIT TABS Take by mouth. Take 1 tablet by mouth every morning.  . chlorthalidone (HYGROTON) 25 MG tablet TAKE ONE TABLET BY MOUTH TWICE DAILY  . Cholecalciferol (VITAMIN D-3 PO) Take 1 capsule by mouth every morning.   . clonazePAM (KLONOPIN) 2 MG tablet TAKE 1 TABLET TWICE DAILY AS NEEDED  FOR ANXIETY  . cyclobenzaprine (FLEXERIL) 10 MG tablet TAKE ONE TABLET BY MOUTH THREE TIMES DAILY AS NEEDED FOR MUSCLE SPASM  . ezetimibe-simvastatin (VYTORIN) 10-80 MG tablet Take 1 tablet by mouth daily.  . furosemide (LASIX) 20 MG tablet TAKE ONE (1) TABLET EACH DAY  . hydrocortisone 1 % lotion Apply 1 application topically 2 (two) times daily.  [name] levothyroxine (SYNTHROID, LEVOTHROID) 75 MCG tablet TAKE ONE (1) TABLET EACH DAY  . metoprolol succinate (TOPROL-XL) 50 MG 24 hr tablet TAKE ONE TABLET BY MOUTH TWICE DAILY  . ofloxacin (FLOXIN OTIC) 0.3 % otic solution Place 5 drops into the left ear daily.  [name] omega-3 acid ethyl esters (LOVAZA) 1 G capsule Take 1 g by mouth 2 (two) times daily.  [name] omeprazole (PRILOSEC) 20 MG capsule TAKE ONE CAPSULE BY MOUTH TWICE A DAY  . Ospemifene (OSPHENA) 60 MG TABS Take 1 tablet by mouth daily. Reported on 06/29/2015  . potassium chloride (K-DUR) 10 MEQ tablet TAKE FIVE TABLETS TWICE DAILY  . potassium chloride SA (K-DUR,KLOR-CON) 20 MEQ tablet Take 2 tablets in the morning and 3 in the evening  . PREMARIN vaginal cream APPLY ONCE WEEKLY  . RESTASIS 0.05 % ophthalmic emulsion INSTILL ONE DROP IN BOTH EYES TWICE DAILY  . triamterene-hydrochlorothiazide (MAXZIDE-25) 37.5-25 MG tablet Take 1 tablet by mouth daily.  [name] venlafaxine XR (EFFEXOR-XR) 75 MG 24 hr capsule TAKE 3 CAPSULES EVERY MORNING      Physical Exam Physical Exam 1.BP 131/65   Pulse 62   Wt 220 lb (99.8 kg)   BMI 36.61 kg/m   2. Gen. appearance. The patient is well-developed and well-nourished, grooming and hygiene are normal. There are no gross congenital abnormalities  3. The patient is alert and oriented to person place and time  4. Mood and affect are normal  5. Ambulation Slight limp favoring the right ankle/leg.  Right ankle is swollen especially laterally compared to the left which is only slightly swollen. She has tenderness at the joint line.  On the right side her range of  motion is 5 in dorsiflexion tendon plantar flexion while on the left she has 15 of plantarflexion and 10 of dorsiflexion. The right ankle remain stable muscle strength is normal plantar flexion dorsiflexion. Both skin incisions are nontender healed without erythema. There are no sensory abnormalities in this peripheral pulses are normal    MEDICAL DECISION MAKING:    Data Reviewed X-ray show intact hardware with degenerative changes in the anteromedial distal tibia and joint space narrowing throughout the tibial talar articulation  Assessment Encounter Diagnoses  Name Primary?  . Right ankle pain, unspecified  chronicity   . Ankle arthritis Yes  . Post-traumatic arthritis of ankle, right   . Closed fracture dislocation of right ankle, sequela      Plan Double upright ankle orthosis Diclofenac 50 mg twice a day  Meds ordered this encounter  Medications  . diclofenac (CATAFLAM) 50 MG tablet    Sig: Take 1 tablet (50 mg total) by mouth 2 (two) times daily.    Dispense:  60 tablet    Refill:  5     [name] 10/02/2016, 2:39 PM

## 2016-10-03 ENCOUNTER — Ambulatory Visit: Payer: Medicaid Other

## 2016-10-04 ENCOUNTER — Ambulatory Visit: Payer: Medicare Other | Admitting: Family Medicine

## 2016-10-06 ENCOUNTER — Ambulatory Visit: Payer: Medicaid Other

## 2016-10-06 ENCOUNTER — Other Ambulatory Visit: Payer: Self-pay | Admitting: Family Medicine

## 2016-10-09 NOTE — Progress Notes (Signed)
Cardiology Office Note  Date: 10/10/2016   ID: [name], DOB 08-Sep-1950, MRN [id number]  PCP: Mechele Claude, MD  Primary Cardiologist: Nona Dell, MD   Chief Complaint  Patient presents with  . PSVT    History of Present Illness: [name] is a 66 y.o. female last seen in December 2016. She presents for a follow-up visit. Continues to follow with Dr. Darlyn Read for primary care. She tells be that she had a right ankle fracture since I saw her, has undergone surgery since then, now dealing with swelling of her foot and also arthritis pain.  From a cardiac perspective she does not report any progressive palpitations, states she has done well with this on Toprol-XL over the last year. She does not report any exertional chest pain.  I personally reviewed her ECG today which shows sinus tachycardia with right bundle branch block.  Past Medical History:  Diagnosis Date  . Anxiety   . Arthritis   . Borderline hypertension   . Chronic lower back pain   . Chronic shoulder pain   . Depression   . Fe deficiency anemia   . GERD (gastroesophageal reflux disease)   . Heartburn   . Hiatal hernia   . History of cardiac catheterization    Normal coronaries  . History of mixed drug abuse   . Hyperlipidemia   . Hypertension   . Hypothyroidism    [name]   . Lumbar herniated disc    L4-L5   . Microscopic hematuria   . Obesity   . Personal history of venous thrombosis and embolism   . PSVT (paroxysmal supraventricular tachycardia) (HCC) 8/05  . Recurrent cystitis    Dr. Aldean Ast     Past Surgical History:  Procedure Laterality Date  . APPENDECTOMY  2004?or 11/03?  [name] BACK SURGERY    . BLADDER REPAIR    . CARPAL TUNNEL RELEASE     x2   . HARDWARE REMOVAL Right 02/18/2016   Procedure: HARDWARE REMOVAL;  Surgeon: Vickki Hearing, MD;  Location: [facility];  Service: Orthopedics;  Laterality: Right;  right ankle medial malleolar screw  . HARDWARE REMOVAL Right 03/08/2016     Procedure: HARDWARE REMOVAL AND INTERNAL FIXATION OF RIGHT MEDIAL MALLEOLUS  - RIGHT ANKLE REMOVAL OF MALLEOLAR SCREW, INSERTION OF 2  0.45 K WIRES;  Surgeon: Vickki Hearing, MD;  Location: [facility];  Service: Orthopedics;  Laterality: Right;  RIGHT ANKLE REMOVAL OF MALLEOLAR SCREW  . LIVER BIOPSY  2002   Dr. Katrinka Blazing   . ORIF ANKLE FRACTURE Right 02/02/2016   Procedure: OPEN REDUCTION INTERNAL FIXATION (ORIF) ANKLE FRACTURE;  Surgeon: Vickki Hearing, MD;  Location: [facility];  Service: Orthopedics;  Laterality: Right;  . ORIF ANKLE FRACTURE Right 02/18/2016   Procedure: OPEN REDUCTION INTERNAL FIXATION (ORIF) ANKLE FRACTURE;  Surgeon: Vickki Hearing, MD;  Location: [facility];  Service: Orthopedics;  Laterality: Right;  reinsertion of medial malleolar screw  . PARTIAL HYSTERECTOMY  2004   One ovary   . RECTAL PROLAPSE REPAIR    . RECTOCELE REPAIR  05/2001  . Right knee arthroscopy  8/05  . Right knee replacement  8/05  . SHOULDER SURGERY      Current Outpatient Prescriptions  Medication Sig Dispense Refill  . AMITIZA 24 MCG capsule TAKE ONE CAPSULE BY MOUTH TWICE A DAY 60 capsule 5  . amoxicillin (AMOXIL) 500 MG capsule 4 po 1 hour before dental procedure 8 capsule 3  . aspirin EC 81  MG tablet Take 81 mg by mouth daily.     [name] atorvastatin (LIPITOR) 80 MG tablet TAKE ONE (1) TABLET EACH DAY 90 tablet 1  . chlorthalidone (HYGROTON) 25 MG tablet TAKE ONE TABLET BY MOUTH TWICE DAILY 60 tablet 5  . clonazePAM (KLONOPIN) 2 MG tablet TAKE 1 TABLET TWICE DAILY AS NEEDED FOR ANXIETY 60 tablet 5  . cyclobenzaprine (FLEXERIL) 10 MG tablet TAKE ONE TABLET BY MOUTH THREE TIMES DAILY AS NEEDED FOR MUSCLE SPASM 90 tablet 5  . diclofenac (VOLTAREN) 75 MG EC tablet Take 75 mg by mouth 2 (two) times daily.    . furosemide (LASIX) 20 MG tablet TAKE ONE (1) TABLET EACH DAY 30 tablet 2  . levothyroxine (SYNTHROID, LEVOTHROID) 75 MCG tablet TAKE ONE (1) TABLET EACH DAY 90 tablet 3  . metoprolol succinate  (TOPROL-XL) 50 MG 24 hr tablet TAKE ONE TABLET BY MOUTH TWICE DAILY 180 tablet 0  . Omega-3 Fatty Acids (FISH OIL) 1200 MG CAPS Take 1 capsule by mouth 2 (two) times daily.    [name] omeprazole (PRILOSEC) 20 MG capsule TAKE ONE CAPSULE BY MOUTH TWICE A DAY 60 capsule 5  . potassium chloride (K-DUR) 10 MEQ tablet TAKE FIVE TABLETS TWICE DAILY 300 tablet 2  . PREMARIN vaginal cream APPLY ONCE WEEKLY 30 g 3  . RESTASIS 0.05 % ophthalmic emulsion INSTILL ONE DROP IN BOTH EYES TWICE DAILY 60 each 11  . venlafaxine XR (EFFEXOR-XR) 75 MG 24 hr capsule TAKE 3 CAPSULES EVERY MORNING 90 capsule 1   No current facility-administered medications for this visit.    Allergies:  Clindamycin; Diphenhydramine hcl; and Naproxen   Social History: The patient  reports that she has never smoked. She has never used smokeless tobacco. She reports that she does not drink alcohol or use drugs.   ROS:  Please see the history of present illness. Otherwise, complete review of systems is positive for right foot pain and swelling.  All other systems are reviewed and negative.   Physical Exam: VS:  BP 110/74   Pulse (!) 109   Ht  (1.676 m)   Wt 224 lb 12.8 oz (102 kg)   SpO2 96%   BMI 36.28 kg/m , BMI Body mass index is 36.28 kg/m.  Wt Readings from Last 3 Encounters:  10/10/16 224 lb 12.8 oz (102 kg)  10/02/16 220 lb (99.8 kg)  07/05/16 214 lb (97.1 kg)    Obese woman, appears comfortable at rest. HEENT: Conjunctiva and lids normal, oropharynx clear. Neck: Supple, no elevated JVP or carotid bruits, no thyromegaly. Lungs: Clear to auscultation, nonlabored breathing at rest. Cardiac: Regular rate and rhythm, no S3 or significant systolic murmur, no pericardial rub. Extremities: No pitting edema, distal pulses 2+.  ECG: I personally reviewed the tracing from 06/11/2015 which showed sinus rhythm with right bundle block.  Recent Labwork: 02/03/2016: Hemoglobin 12.5 07/05/2016: ALT 16; AST 16; BUN 22; Creatinine,  Ser 0.97; Platelets 281; Potassium 3.4; Sodium 142; TSH 0.782     Component Value Date/Time   CHOL 326 (H) 07/05/2016 1459   CHOL 166 01/02/2013 1146   TRIG 202 (H) 07/05/2016 1459   TRIG 122 10/26/2014 1655   TRIG 103 01/02/2013 1146   HDL 60 07/05/2016 1459   HDL 72 10/26/2014 1655   HDL 75 01/02/2013 1146   CHOLHDL 5.4 (H) 07/05/2016 1459   LDLCALC 226 (H) 07/05/2016 1459   LDLCALC 57 12/09/2013 1048   LDLCALC 70 01/02/2013 1146    Other Studies  Reviewed Today:  Echocardiogram 12/19/2012: Study Conclusions  - Left ventricle: There was mild concentric hypertrophy. Systolic function was normal. The estimated ejection fraction was in the range of 55% to 60%. Left ventricular diastolic function parameters were normal. - Left atrium: The atrium was mildly dilated. - Tricuspid valve: Mild regurgitation.  Assessment and Plan:  1. History of PSVT, patient reports doing well in terms of palpitations on current dose of Toprol-XL 50 mg twice daily. ECG reviewed. Continue with current dose unless symptoms escalate.  2. History of hypertension, blood pressure is normal today.  Current medicines were reviewed with the patient today.   Orders Placed This Encounter  Procedures  . EKG 12-Lead    Disposition: Follow-up in one year, sooner if needed.  Signed, Jonelle Sidle, MD, [facility] 10/10/2016 3:42 PM    [facility] at [facility] [address] [place], [place], [place] [address] Phone: [phone]; Fax: [phone]

## 2016-10-10 ENCOUNTER — Ambulatory Visit (INDEPENDENT_AMBULATORY_CARE_PROVIDER_SITE_OTHER): Payer: Medicare Other | Admitting: Cardiology

## 2016-10-10 ENCOUNTER — Encounter: Payer: Self-pay | Admitting: Cardiology

## 2016-10-10 VITALS — BP 110/74 | HR 89 | Ht 66.0 in | Wt 224.8 lb

## 2016-10-10 DIAGNOSIS — I471 Supraventricular tachycardia: Secondary | ICD-10-CM | POA: Diagnosis not present

## 2016-10-10 DIAGNOSIS — I10 Essential (primary) hypertension: Secondary | ICD-10-CM

## 2016-10-10 NOTE — Patient Instructions (Signed)

## 2016-10-23 ENCOUNTER — Ambulatory Visit
Admission: RE | Admit: 2016-10-23 | Discharge: 2016-10-23 | Disposition: A | Discharge status: Home / Self Care | Payer: Medicare Other | Source: Ambulatory Visit | Attending: Family Medicine | Admitting: Family Medicine

## 2016-10-23 ENCOUNTER — Other Ambulatory Visit: Payer: Self-pay | Admitting: Internal Medicine

## 2016-10-23 DIAGNOSIS — Z1231 Encounter for screening mammogram for malignant neoplasm of breast: Secondary | ICD-10-CM

## 2016-11-18 ENCOUNTER — Emergency Department (HOSPITAL_COMMUNITY): Payer: Medicare Other

## 2016-11-18 ENCOUNTER — Emergency Department (HOSPITAL_COMMUNITY)
Admission: EM | Admit: 2016-11-18 | Discharge: 2016-11-18 | Disposition: A | Discharge status: Home / Self Care | Payer: Medicare Other | Attending: Internal Medicine | Admitting: Internal Medicine

## 2016-11-18 ENCOUNTER — Encounter (HOSPITAL_COMMUNITY): Payer: Self-pay

## 2016-11-18 DIAGNOSIS — R202 Paresthesia of skin: Secondary | ICD-10-CM | POA: Insufficient documentation

## 2016-11-18 DIAGNOSIS — K149 Disease of tongue, unspecified: Secondary | ICD-10-CM | POA: Diagnosis not present

## 2016-11-18 DIAGNOSIS — Z79899 Other long term (current) drug therapy: Secondary | ICD-10-CM | POA: Insufficient documentation

## 2016-11-18 DIAGNOSIS — R079 Chest pain, unspecified: Secondary | ICD-10-CM | POA: Insufficient documentation

## 2016-11-18 DIAGNOSIS — R5383 Other fatigue: Secondary | ICD-10-CM | POA: Diagnosis present

## 2016-11-18 DIAGNOSIS — Z7982 Long term (current) use of aspirin: Secondary | ICD-10-CM | POA: Diagnosis not present

## 2016-11-18 DIAGNOSIS — F419 Anxiety disorder, unspecified: Secondary | ICD-10-CM

## 2016-11-18 DIAGNOSIS — E039 Hypothyroidism, unspecified: Secondary | ICD-10-CM | POA: Insufficient documentation

## 2016-11-18 DIAGNOSIS — I10 Essential (primary) hypertension: Secondary | ICD-10-CM | POA: Insufficient documentation

## 2016-11-18 LAB — COMPREHENSIVE METABOLIC PANEL
ALBUMIN: 3.9 g/dL (ref 3.5–5.0)
ALK PHOS: 102 U/L (ref 38–126)
ALT: 29 U/L (ref 14–54)
ANION GAP: 11 (ref 5–15)
AST: 29 U/L (ref 15–41)
BUN: 27 mg/dL — ABNORMAL HIGH (ref 6–20)
CHLORIDE: 98 mmol/L — AB (ref 101–111)
CO2: 28 mmol/L (ref 22–32)
Calcium: 9.5 mg/dL (ref 8.9–10.3)
Creatinine, Ser: 0.96 mg/dL (ref 0.44–1.00)
GFR calc Af Amer: >60 mL/min (ref >60)
GFR calc non Af Amer: >60 mL/min (ref >60)
GLUCOSE: 106 mg/dL — AB (ref 65–99)
POTASSIUM: 3.2 mmol/L — AB (ref 3.5–5.1)
Sodium: 137 mmol/L (ref 135–145)
Total Bilirubin: 0.8 mg/dL (ref 0.3–1.2)
Total Protein: 7.3 g/dL (ref 6.5–8.1)

## 2016-11-18 LAB — URINALYSIS, ROUTINE W REFLEX MICROSCOPIC
BILIRUBIN URINE: NEGATIVE
GLUCOSE, UA: NEGATIVE mg/dL
HGB URINE DIPSTICK: NEGATIVE
Ketones, ur: NEGATIVE mg/dL
Leukocytes, UA: NEGATIVE
Nitrite: NEGATIVE
PROTEIN: NEGATIVE mg/dL
Specific Gravity, Urine: 1.021 (ref 1.005–1.030)
pH: 5 (ref 5.0–8.0)

## 2016-11-18 LAB — DIFFERENTIAL
Basophils Absolute: 0 10*3/uL (ref 0.0–0.1)
Basophils Relative: 0 %
EOS PCT: 1 %
Eosinophils Absolute: 0.1 10*3/uL (ref 0.0–0.7)
LYMPHS PCT: 34 %
Lymphs Abs: 3.1 10*3/uL (ref 0.7–4.0)
Monocytes Absolute: 0.7 10*3/uL (ref 0.1–1.0)
Monocytes Relative: 7 %
Neutro Abs: 5.4 10*3/uL (ref 1.7–7.7)
Neutrophils Relative %: 58 %

## 2016-11-18 LAB — CBC
HCT: 36.8 % (ref 36.0–46.0)
Hemoglobin: 12.8 g/dL (ref 12.0–15.0)
MCH: 30.2 pg (ref 26.0–34.0)
MCHC: 34.8 g/dL (ref 30.0–36.0)
MCV: 86.8 fL (ref 78.0–100.0)
PLATELETS: 249 10*3/uL (ref 150–400)
RBC: 4.24 MIL/uL (ref 3.87–5.11)
RDW: 12.1 % (ref 11.5–15.5)
WBC: 9.3 10*3/uL (ref 4.0–10.5)

## 2016-11-18 LAB — PROTIME-INR
INR: 1.01
PROTHROMBIN TIME: 13.3 s (ref 11.4–15.2)

## 2016-11-18 LAB — RAPID URINE DRUG SCREEN, HOSP PERFORMED
AMPHETAMINES: NOT DETECTED
BARBITURATES: NOT DETECTED
BENZODIAZEPINES: NOT DETECTED
COCAINE: NOT DETECTED
Opiates: NOT DETECTED
TETRAHYDROCANNABINOL: NOT DETECTED

## 2016-11-18 LAB — TROPONIN I

## 2016-11-18 LAB — ETHANOL: Alcohol, Ethyl (B): <5 mg/dL (ref <5)

## 2016-11-18 LAB — APTT: aPTT: 28 seconds (ref 24–36)

## 2016-11-18 NOTE — ED Provider Notes (Signed)
[facility] Provider Note   CSN: [id number] Arrival date & time: 11/18/16  1206     History   Chief Complaint Chief Complaint  Patient presents with  . Fatigue    HPI [name] is a 66 y.o. female.  HPI Pt was seen at 1240. Per pt, c/o gradual onset and persistence of constant multiple symptoms for the past 1 month. Symptoms include: intermittent numbness "all over my body," fatigue, CP, sleeplessness, and "tongue was sore." Pt states her symptoms began after she ran out of Klonopin. States she is due to see her PMD in 4 days for a refill of her medication. Denies slurred speech, no facial droop, no back pain, no rash, no fevers, no focal motor weakness, no palpitations, no SOB/cough, no abd pain, no N/V/D.    Past Medical History:  Diagnosis Date  . Anxiety   . Arthritis   . Borderline hypertension   . Chronic lower back pain   . Chronic shoulder pain   . Depression   . Fe deficiency anemia   . GERD (gastroesophageal reflux disease)   . Heartburn   . Hiatal hernia   . History of cardiac catheterization    Normal coronaries  . History of mixed drug abuse   . Hyperlipidemia   . Hypertension   . Hypothyroidism    [name]   . Lumbar herniated disc    L4-L5   . Microscopic hematuria   . Obesity   . Personal history of venous thrombosis and embolism   . PSVT (paroxysmal supraventricular tachycardia) (HCC) 8/05  . Recurrent cystitis    Dr. Aldean Ast     Patient Active Problem List   Diagnosis Date Noted  . Post-menopause 07/05/2016  . Benzodiazepine dependence (HCC) 05/07/2016  . Failed orthopedic implant (HCC)   . Fracture of ankle, trimalleolar, right, closed   . Depression 06/29/2015  . GAD (generalized anxiety disorder) 06/29/2015  . LVH (left ventricular hypertrophy) 03/24/2015  . Arthritis 03/24/2015  . Hypothyroidism   . Vitamin D deficiency 01/02/2013  . Fatigue 10/03/2012  . Obesity 10/03/2012  . PSVT (paroxysmal supraventricular  tachycardia) (HCC) 02/05/2012  . Right bundle branch block 02/03/2011  . Essential hypertension, benign 03/02/2009    Past Surgical History:  Procedure Laterality Date  . APPENDECTOMY  2004?or 11/03?  [name] BACK SURGERY    . BLADDER REPAIR    . CARPAL TUNNEL RELEASE     x2   . HARDWARE REMOVAL Right 02/18/2016   Procedure: HARDWARE REMOVAL;  Surgeon: Vickki Hearing, MD;  Location: [facility];  Service: Orthopedics;  Laterality: Right;  right ankle medial malleolar screw  . HARDWARE REMOVAL Right 03/08/2016   Procedure: HARDWARE REMOVAL AND INTERNAL FIXATION OF RIGHT MEDIAL MALLEOLUS  - RIGHT ANKLE REMOVAL OF MALLEOLAR SCREW, INSERTION OF 2  0.45 K WIRES;  Surgeon: Vickki Hearing, MD;  Location: [facility];  Service: Orthopedics;  Laterality: Right;  RIGHT ANKLE REMOVAL OF MALLEOLAR SCREW  . LIVER BIOPSY  2002   Dr. Katrinka Blazing   . ORIF ANKLE FRACTURE Right 02/02/2016   Procedure: OPEN REDUCTION INTERNAL FIXATION (ORIF) ANKLE FRACTURE;  Surgeon: Vickki Hearing, MD;  Location: [facility];  Service: Orthopedics;  Laterality: Right;  . ORIF ANKLE FRACTURE Right 02/18/2016   Procedure: OPEN REDUCTION INTERNAL FIXATION (ORIF) ANKLE FRACTURE;  Surgeon: Vickki Hearing, MD;  Location: [facility];  Service: Orthopedics;  Laterality: Right;  reinsertion of medial malleolar screw  . PARTIAL HYSTERECTOMY  2004   One  ovary   . RECTAL PROLAPSE REPAIR    . RECTOCELE REPAIR  05/2001  . Right knee arthroscopy  8/05  . Right knee replacement  8/05  . SHOULDER SURGERY      OB History    Gravida Para Term Preterm AB Living   6 5 5  0 1 5   SAB TAB Ectopic Multiple Live Births   1 0 0 0         Home Medications    Prior to Admission medications   Medication Sig Start Date End Date Taking? Authorizing Provider  AMITIZA 24 MCG capsule TAKE ONE CAPSULE BY MOUTH TWICE A DAY 07/14/16   Mechele Claude, MD  amoxicillin (AMOXIL) 500 MG capsule 4 po 1 hour before dental procedure 07/06/16   Mechele Claude, MD  aspirin EC  81 MG tablet Take 81 mg by mouth daily.     [provider]  atorvastatin (LIPITOR) 80 MG tablet TAKE ONE (1) TABLET EACH DAY 05/15/16   Mechele Claude, MD  chlorthalidone (HYGROTON) 25 MG tablet TAKE ONE TABLET BY MOUTH TWICE DAILY 07/14/16   Mechele Claude, MD  clonazePAM (KLONOPIN) 2 MG tablet TAKE 1 TABLET TWICE DAILY AS NEEDED FOR ANXIETY 04/17/16   Mechele Claude, MD  cyclobenzaprine (FLEXERIL) 10 MG tablet TAKE ONE TABLET BY MOUTH THREE TIMES DAILY AS NEEDED FOR MUSCLE SPASM 07/14/16   Mechele Claude, MD  diclofenac (VOLTAREN) 75 MG EC tablet Take 75 mg by mouth 2 (two) times daily.    [provider]  furosemide (LASIX) 20 MG tablet TAKE ONE (1) TABLET EACH DAY 08/10/16   Mechele Claude, MD  levothyroxine (SYNTHROID, LEVOTHROID) 75 MCG tablet TAKE ONE (1) TABLET EACH DAY 05/15/16   Mechele Claude, MD  metoprolol succinate (TOPROL-XL) 50 MG 24 hr tablet TAKE ONE TABLET BY MOUTH TWICE DAILY 09/08/16   Jonelle Sidle, MD  Omega-3 Fatty Acids (FISH OIL) 1200 MG CAPS Take 1 capsule by mouth 2 (two) times daily.    [provider]  omeprazole (PRILOSEC) 20 MG capsule TAKE ONE CAPSULE BY MOUTH TWICE A DAY 07/06/16   Mechele Claude, MD  potassium chloride (K-DUR) 10 MEQ tablet TAKE FIVE TABLETS TWICE DAILY 07/14/16   Mechele Claude, MD  PREMARIN vaginal cream APPLY ONCE WEEKLY 04/17/16   Mechele Claude, MD  RESTASIS 0.05 % ophthalmic emulsion INSTILL ONE DROP IN BOTH EYES TWICE DAILY 01/11/16   Mechele Claude, MD  venlafaxine XR (EFFEXOR-XR) 75 MG 24 hr capsule TAKE 3 CAPSULES EVERY MORNING 08/10/16   Mechele Claude, MD    Family History Family History  Problem Relation Age of Onset  . Breast cancer Mother   . Breast cancer Sister   . Heart attack Unknown        Family history   . Coronary artery disease Unknown        Family History     Social History Social History  Substance Use Topics  . Smoking status: Never Smoker  . Smokeless tobacco: Never Used  . Alcohol use No       Allergies   Clindamycin; Diphenhydramine hcl; and Naproxen   Review of Systems Review of Systems ROS: Statement: All systems negative except as marked or noted in the HPI; Constitutional: Negative for fever and chills. +fatigue.; ; Eyes: Negative for eye pain, redness and discharge. ; ; ENMT: +"tongue sore." Negative for ear pain, hoarseness, nasal congestion, sinus pressure and sore throat. ; ; Cardiovascular: +CP. Negative for palpitations, diaphoresis, dyspnea and peripheral  edema. ; ; Respiratory: Negative for cough, wheezing and stridor. ; ; Gastrointestinal: Negative for nausea, vomiting, diarrhea, abdominal pain, blood in stool, hematemesis, jaundice and rectal bleeding. . ; ; Genitourinary: Negative for dysuria, flank pain and hematuria. ; ; Musculoskeletal: Negative for back pain and neck pain. Negative for swelling and trauma.; ; Skin: Negative for pruritus, rash, abrasions, blisters, bruising and skin lesion.; ; Neuro: +paresthesias. Negative for headache, lightheadedness and neck stiffness. Negative for weakness, altered level of consciousness, altered mental status, extremity weakness, involuntary movement, seizure and syncope.     Physical Exam Updated Vital Signs BP 112/74 (BP Location: Right Arm)   Pulse (!) 105   Temp 97.7 F (36.5 C) (Oral)   Resp 16   Ht 5\' 5"  (1.651 m)   Wt 97.5 kg (215 lb)   SpO2 98%   BMI 35.78 kg/m     13:42:19 Orthostatic Vital Signs SL  Orthostatic Lying   BP- Lying: 129/44  Pulse- Lying: 92      Orthostatic Sitting  BP- Sitting: 137/54  Pulse- Sitting: 97      Orthostatic Standing at 0 minutes  BP- Standing at 0 minutes: 120/48  Pulse- Standing at 0 minutes: 103    Physical Exam 1245: Physical examination:  Nursing notes reviewed; Vital signs and O2 SAT reviewed;  Constitutional: Well developed, Well nourished, Well hydrated, In no acute distress; Head:  Normocephalic, atraumatic; Eyes: EOMI, PERRL, No scleral icterus; ENMT:  No mouth or tongue lesions. No intra-oral edema. No hoarse voice, no drooling, no stridor. Mouth and pharynx normal, Mucous membranes moist; Neck: Supple, Full range of motion, No lymphadenopathy; Cardiovascular: Regular rate and rhythm, No gallop; Respiratory: Breath sounds clear & equal bilaterally, No wheezes.  Speaking full sentences with ease, Normal respiratory effort/excursion; Chest: Nontender, Movement normal; Abdomen: Soft, Nontender, Nondistended, Normal bowel sounds; Genitourinary: No CVA tenderness; Extremities: Pulses normal, No tenderness, No edema, No calf edema or asymmetry.; Neuro: AA&Ox3, vague/rambling historian. Major CN grossly intact. Speech clear.  No facial droop. Grips equal. Strength 5/5 equal bilat UE's and LE's.  DTR 2/4 equal bilat UE's and LE's.  +left lower face and LUE subjective decreased sensation, otherwise no gross sensory deficits.  Normal cerebellar testing bilat UE's (finger-nose) and LE's (heel-shin)..; Skin: Color normal, Warm, Dry.     ED Treatments / Results  Labs (all labs ordered are listed, but only abnormal results are displayed)   EKG  EKG Interpretation  Date/Time:  Saturday November 18 2016 12:28:10 EDT Ventricular Rate:  74 PR Interval:    QRS Duration: 141 QT Interval:  430 QTC Calculation: 478 R Axis:   55 Text Interpretation:  Sinus rhythm Right bundle branch block When compared with ECG of 08/16/2013 No significant change was found Confirmed by [facility]  MD, Nicholos Johns [phone]) on 11/18/2016 12:55:03 PM       Radiology   Procedures Procedures (including critical care time)  Medications Ordered in ED Medications - No data to display   Initial Impression / Assessment and Plan / ED Course  I have reviewed the triage vital signs and the nursing notes.  Pertinent labs & imaging results that were available during my care of the patient were reviewed by me and considered in my medical decision making (see chart for details).  MDM Reviewed:  previous chart, nursing note and vitals Reviewed previous: labs and ECG Interpretation: labs, ECG, x-ray and CT scan    Results for orders placed or performed during the hospital encounter of 11/18/16  Ethanol  Result Value Ref Range   Alcohol, Ethyl (B) <5 <5 mg/dL  Protime-INR  Result Value Ref Range   Prothrombin Time 13.3 11.4 - 15.2 seconds   INR 1.01   APTT  Result Value Ref Range   aPTT 28 24 - 36 seconds  CBC  Result Value Ref Range   WBC 9.3 4.0 - 10.5 K/uL   RBC 4.24 3.87 - 5.11 MIL/uL   Hemoglobin 12.8 12.0 - 15.0 g/dL   HCT 40.936.8 81.136.0 - 91.446.0 %   MCV 86.8 78.0 - 100.0 fL   MCH 30.2 26.0 - 34.0 pg   MCHC 34.8 30.0 - 36.0 g/dL   RDW 78.212.1 95.611.5 - 21.315.5 %   Platelets 249 150 - 400 K/uL  Differential  Result Value Ref Range   Neutrophils Relative % 58 %   Neutro Abs 5.4 1.7 - 7.7 K/uL   Lymphocytes Relative 34 %   Lymphs Abs 3.1 0.7 - 4.0 K/uL   Monocytes Relative 7 %   Monocytes Absolute 0.7 0.1 - 1.0 K/uL   Eosinophils Relative 1 %   Eosinophils Absolute 0.1 0.0 - 0.7 K/uL   Basophils Relative 0 %   Basophils Absolute 0.0 0.0 - 0.1 K/uL  Comprehensive metabolic panel  Result Value Ref Range   Sodium 137 135 - 145 mmol/L   Potassium 3.2 (L) 3.5 - 5.1 mmol/L   Chloride 98 (L) 101 - 111 mmol/L   CO2 28 22 - 32 mmol/L   Glucose, Bld 106 (H) 65 - 99 mg/dL   BUN 27 (H) 6 - 20 mg/dL   Creatinine, Ser 0.860.96 0.44 - 1.00 mg/dL   Calcium 9.5 8.9 - 57.810.3 mg/dL   Total Protein 7.3 6.5 - 8.1 g/dL   Albumin 3.9 3.5 - 5.0 g/dL   AST 29 15 - 41 U/L   ALT 29 14 - 54 U/L   Alkaline Phosphatase 102 38 - 126 U/L   Total Bilirubin 0.8 0.3 - 1.2 mg/dL   GFR calc non Af Amer >60 >60 mL/min   GFR calc Af Amer >60 >60 mL/min   Anion gap 11 5 - 15  Urine rapid drug screen (hosp performed)not [facility]  Result Value Ref Range   Opiates NONE DETECTED NONE DETECTED   Cocaine NONE DETECTED NONE DETECTED   Benzodiazepines NONE DETECTED NONE DETECTED   Amphetamines NONE DETECTED NONE  DETECTED   Tetrahydrocannabinol NONE DETECTED NONE DETECTED   Barbiturates NONE DETECTED NONE DETECTED  Urinalysis, Routine w reflex microscopic  Result Value Ref Range   Color, Urine YELLOW YELLOW   APPearance HAZY (A) CLEAR   Specific Gravity, Urine 1.021 1.005 - 1.030   pH 5.0 5.0 - 8.0   Glucose, UA NEGATIVE NEGATIVE mg/dL   Hgb urine dipstick NEGATIVE NEGATIVE   Bilirubin Urine NEGATIVE NEGATIVE   Ketones, ur NEGATIVE NEGATIVE mg/dL   Protein, ur NEGATIVE NEGATIVE mg/dL   Nitrite NEGATIVE NEGATIVE   Leukocytes, UA NEGATIVE NEGATIVE  Troponin I  Result Value Ref Range   Troponin I <0.03 <0.03 ng/mL    Ct Head Wo Contrast Result Date: 11/18/2016 CLINICAL DATA:  66 year old female with fatigue, intermittent face arm and body numbness. Possible benzodiazepine withdrawal. EXAM: CT HEAD WITHOUT CONTRAST TECHNIQUE: Contiguous axial images were obtained from the base of the skull through the vertex without intravenous contrast. COMPARISON:  Report of brain MRI 04/16/2011 (no images available). FINDINGS: Brain: Cerebral volume is within normal limits for age. No midline shift, ventriculomegaly, mass effect,  evidence of mass lesion, intracranial hemorrhage or evidence of cortically based acute infarction. Patchy mostly subcortical white matter hypodensity in both hemispheres. The extent is mild to moderate for age. No cortical encephalomalacia identified. Vascular: Calcified atherosclerosis at the skull base. No suspicious intracranial vascular hyperdensity. Skull: No acute osseous abnormality identified. Mild hyperostosis frontalis, normal variant. Sinuses/Orbits: Clear. Other: No acute orbit or scalp soft tissue findings; punctate retained metallic density foreign body in the forehead soft tissues on series 3, image 10. IMPRESSION: 1.  No acute intracranial abnormality. 2. Mild to moderate for age nonspecific cerebral white matter changes, most commonly due to chronic small vessel disease.  Electronically Signed   By: Odessa Fleming [name]   On: 11/18/2016 13:43    1245:  During my exam: pt had left lower face and LUE decreased sensation compared to opposite side. I asked pt when this began. Pt states it had been "coming and going" for the past 2 days, lasting 4 hours yesterday before resolving. Pt unclear when her symptoms began today, stating she "didn't sleep last night," and her symptoms began again sometime overnight. Pt stated her symptoms were "getting better" since arrival to the ED. Pt not code stroke given unknown LKW, as well as improving symptoms. Workup ordered.   1405:  Workup reassuring. T/C to [facility] Dr. Conley Rolls, case discussed, including:  HPI, pertinent PM/SHx, VS/PE, dx testing, ED course and treatment:  Agreeable to come to the ED for evaluation.   1530:  [facility] Dr. Conley Rolls has evaluated pt in the ED (see consult note): states no indication for admission at this time and to d/c pt to home.     Final Clinical Impressions(s) / ED Diagnoses   Final diagnoses:  None    New Prescriptions New Prescriptions   No medications on file     Samuel Jester, DO 11/22/16 1610

## 2016-11-18 NOTE — Consult Note (Signed)
CONSULT NOTE>   [name] AOZ:[id number] DOB: 1951/01/31 DOA: 11/18/2016  PCP: Benita Stabile, MD  Patient coming from: HOME>    Chief Complaint:  Numbness all over her body for the past 2 weeks, requesting Klonopin refills.   HPI: [name] is an 66 y.o. female with hx of HTN, HLD, anxiety, depression, hypothyroidism, mixed drug use disorder, presented to the ER to have her Klonopin refill.  She had been on 2mg  Klonopin BID, but had been off of it for the past month. She said her PCP didn't fill it for her, and discharged her from the practice.  She proceeded to change PCP to Dr Margo Aye on her medicaid card, and subsequently, she obtained an appointment on Wed.  She also complained of numbness on her left face, then right face, then circumoral paresthesia.  Sometimes on her left leg, other times on her right legs, for the past 2 weeks.  No palpitation, weakness, slurred speech, or any other neurological symptoms.  Work up included a negative CT of the head, and serology was unremarkable.  Hospitalist was asked to evaluate her for stroke work up.   ED Course:  See above.  Rewiew of Systems:  Constitutional: Negative for malaise, fever and chills. No significant weight loss or weight gain Eyes: Negative for eye pain, redness and discharge, diplopia, visual changes, or flashes of light. ENMT: Negative for ear pain, hoarseness, nasal congestion, sinus pressure and sore throat. No headaches; tinnitus, drooling, or problem swallowing. Cardiovascular: Negative for chest pain, palpitations, diaphoresis, dyspnea and peripheral edema. ; No orthopnea, PND Respiratory: Negative for cough, hemoptysis, wheezing and stridor. No pleuritic chestpain. Gastrointestinal: Negative for diarrhea, constipation,  melena, blood in stool, hematemesis, jaundice and rectal bleeding.    Genitourinary: Negative for frequency, dysuria, incontinence,flank pain and hematuria; Musculoskeletal: Negative for back pain and neck  pain. Negative for swelling and trauma.;  Skin: . Negative for pruritus, rash, abrasions, bruising and skin lesion.; ulcerations Neuro: Negative for headache, lightheadedness and neck stiffness. Negative for weakness, altered level of consciousness , altered mental status, extremity weakness, burning feet, involuntary movement, seizure and syncope.  Psych: negative for  depression, insomnia, tearfulness, panic attacks, hallucinations, paranoia, suicidal or homicidal ideation   Past Medical History:  Diagnosis Date  . Anxiety   . Arthritis   . Borderline hypertension   . Chronic lower back pain   . Chronic shoulder pain   . Depression   . Fe deficiency anemia   . GERD (gastroesophageal reflux disease)   . Heartburn   . Hiatal hernia   . History of cardiac catheterization    Normal coronaries  . History of mixed drug abuse   . Hyperlipidemia   . Hypertension   . Hypothyroidism    [name]   . Lumbar herniated disc    L4-L5   . Microscopic hematuria   . Obesity   . Personal history of venous thrombosis and embolism   . PSVT (paroxysmal supraventricular tachycardia) (HCC) 8/05  . Recurrent cystitis    Dr. Aldean Ast     Past Surgical History:  Procedure Laterality Date  . APPENDECTOMY  2004?or 11/03?  [name] BACK SURGERY    . BLADDER REPAIR    . CARPAL TUNNEL RELEASE     x2   . HARDWARE REMOVAL Right 02/18/2016   Procedure: HARDWARE REMOVAL;  Surgeon: Vickki Hearing, MD;  Location: [facility];  Service: Orthopedics;  Laterality: Right;  right ankle medial malleolar screw  . HARDWARE REMOVAL Right  03/08/2016   Procedure: HARDWARE REMOVAL AND INTERNAL FIXATION OF RIGHT MEDIAL MALLEOLUS  - RIGHT ANKLE REMOVAL OF MALLEOLAR SCREW, INSERTION OF 2  0.45 K WIRES;  Surgeon: Vickki HearingStanley E Harrison, MD;  Location: [facility];  Service: Orthopedics;  Laterality: Right;  RIGHT ANKLE REMOVAL OF MALLEOLAR SCREW  . LIVER BIOPSY  2002   Dr. Katrinka BlazingSmith   . ORIF ANKLE FRACTURE Right 02/02/2016   Procedure: OPEN  REDUCTION INTERNAL FIXATION (ORIF) ANKLE FRACTURE;  Surgeon: Vickki HearingStanley E Harrison, MD;  Location: [facility];  Service: Orthopedics;  Laterality: Right;  . ORIF ANKLE FRACTURE Right 02/18/2016   Procedure: OPEN REDUCTION INTERNAL FIXATION (ORIF) ANKLE FRACTURE;  Surgeon: Vickki HearingStanley E Harrison, MD;  Location: [facility];  Service: Orthopedics;  Laterality: Right;  reinsertion of medial malleolar screw  . PARTIAL HYSTERECTOMY  2004   One ovary   . RECTAL PROLAPSE REPAIR    . RECTOCELE REPAIR  05/2001  . Right knee arthroscopy  8/05  . Right knee replacement  8/05  . SHOULDER SURGERY       reports that she has never smoked. She has never used smokeless tobacco. She reports that she does not drink alcohol or use drugs.  Allergies  Allergen Reactions  . Clindamycin     REACTION: caused C-diff per patient  . Diphenhydramine Hcl Other (See Comments)    REACTION:  Severely drowsy  . Naproxen     Burning in lower abdominal area.    Family History  Problem Relation Age of Onset  . Breast cancer Mother   . Breast cancer Sister   . Heart attack Unknown        Family history   . Coronary artery disease Unknown        Family History      Prior to Admission medications   Medication Sig Start Date End Date Taking? Authorizing Provider  AMITIZA 24 MCG capsule TAKE ONE CAPSULE BY MOUTH TWICE A DAY 07/14/16  Yes Mechele ClaudeStacks, Warren, MD  amoxicillin (AMOXIL) 500 MG capsule 4 po 1 hour before dental procedure 07/06/16  Yes Mechele ClaudeStacks, Warren, MD  aspirin EC 81 MG tablet Take 81 mg by mouth daily.    Yes [provider]  atorvastatin (LIPITOR) 80 MG tablet TAKE ONE (1) TABLET EACH DAY 05/15/16  Yes Stacks, Broadus JohnWarren, MD  chlorthalidone (HYGROTON) 25 MG tablet TAKE ONE TABLET BY MOUTH TWICE DAILY 07/14/16  Yes Stacks, Broadus JohnWarren, MD  clonazePAM (KLONOPIN) 2 MG tablet TAKE 1 TABLET TWICE DAILY AS NEEDED FOR ANXIETY 04/17/16  Yes Stacks, Broadus JohnWarren, MD  cyclobenzaprine (FLEXERIL) 10 MG tablet TAKE ONE TABLET BY MOUTH THREE TIMES  DAILY AS NEEDED FOR MUSCLE SPASM 07/14/16  Yes Mechele ClaudeStacks, Warren, MD  diclofenac (VOLTAREN) 75 MG EC tablet Take 75 mg by mouth 2 (two) times daily.   Yes [provider]  furosemide (LASIX) 20 MG tablet TAKE ONE (1) TABLET EACH DAY 08/10/16  Yes Mechele ClaudeStacks, Warren, MD  levothyroxine (SYNTHROID, LEVOTHROID) 75 MCG tablet TAKE ONE (1) TABLET EACH DAY 05/15/16  Yes Stacks, Broadus JohnWarren, MD  metoprolol succinate (TOPROL-XL) 50 MG 24 hr tablet TAKE ONE TABLET BY MOUTH TWICE DAILY 09/08/16  Yes Jonelle SidleMcDowell, Samuel G, MD  Omega-3 Fatty Acids (FISH OIL) 1200 MG CAPS Take 1 capsule by mouth 2 (two) times daily.   Yes [provider]  omeprazole (PRILOSEC) 20 MG capsule TAKE ONE CAPSULE BY MOUTH TWICE A DAY 07/06/16  Yes Mechele ClaudeStacks, Warren, MD  potassium chloride (K-DUR) 10 MEQ tablet TAKE FIVE TABLETS TWICE  DAILY 07/14/16  Yes Mechele Claude, MD  PREMARIN vaginal cream APPLY ONCE WEEKLY 04/17/16  Yes Mechele Claude, MD  RESTASIS 0.05 % ophthalmic emulsion INSTILL ONE DROP IN BOTH EYES TWICE DAILY 01/11/16  Yes Mechele Claude, MD  venlafaxine XR (EFFEXOR-XR) 75 MG 24 hr capsule TAKE 3 CAPSULES EVERY MORNING 08/10/16  Yes Mechele Claude, MD    Physical Exam: Vitals:   11/18/16 1217 11/18/16 1345 11/18/16 1415 11/18/16 1430  BP: 112/74 (!) 120/48 125/62 129/65  Pulse: (!) 105 100 72 75  Resp: 16 19 17 12   Temp: 97.7 F (36.5 C)     TempSrc: Oral     SpO2: 98% 100% 100% 100%  Weight: 97.5 kg (215 lb)     Height: 5\' 5"  (1.651 m)         Constitutional: NAD, calm, comfortable Vitals:   11/18/16 1217 11/18/16 1345 11/18/16 1415 11/18/16 1430  BP: 112/74 (!) 120/48 125/62 129/65  Pulse: (!) 105 100 72 75  Resp: 16 19 17 12   Temp: 97.7 F (36.5 C)     TempSrc: Oral     SpO2: 98% 100% 100% 100%  Weight: 97.5 kg (215 lb)     Height: 5\' 5"  (1.651 m)      Eyes: PERRL, lids and conjunctivae normal ENMT: Mucous membranes are moist. Posterior pharynx clear of any exudate or lesions.Normal dentition.  Neck:  normal, supple, no masses, no thyromegaly Respiratory: clear to auscultation bilaterally, no wheezing, no crackles. Normal respiratory effort. No accessory muscle use.  Cardiovascular: Regular rate and rhythm, no murmurs / rubs / gallops. No extremity edema. 2+ pedal pulses. No carotid bruits.  Abdomen: no tenderness, no masses palpated. No hepatosplenomegaly. Bowel sounds positive.  Musculoskeletal: no clubbing / cyanosis. No joint deformity upper and lower extremities. Good ROM, no contractures. Normal muscle tone.  Skin: no rashes, lesions, ulcers. No induration Neurologic: CN 2-12 grossly intact. Sensation intact, DTR normal. Strength 5/5 in all 4.  Psychiatric: Normal judgment and insight. Alert and oriented x 3. Normal mood.   Labs on Admission: I have personally reviewed following labs and imaging studies CBC:  Recent Labs Lab 11/18/16 1251  WBC 9.3  NEUTROABS 5.4  HGB 12.8  HCT 36.8  MCV 86.8  PLT 249   Basic Metabolic Panel:  Recent Labs Lab 11/18/16 1251  NA 137  K 3.2*  CL 98*  CO2 28  GLUCOSE 106*  BUN 27*  CREATININE 0.96  CALCIUM 9.5   GFR: Estimated Creatinine Clearance: 67.5 mL/min (by C-G formula based on SCr of 0.96 mg/dL). Liver Function Tests:  Recent Labs Lab 11/18/16 1251  AST 29  ALT 29  ALKPHOS 102  BILITOT 0.8  PROT 7.3  ALBUMIN 3.9   Coagulation Profile:  Recent Labs Lab 11/18/16 1251  INR 1.01   Cardiac Enzymes:  Recent Labs Lab 11/18/16 1251  TROPONINI <0.03   Urine analysis:    Component Value Date/Time   COLORURINE YELLOW 11/18/2016 1320   APPEARANCEUR HAZY (A) 11/18/2016 1320   APPEARANCEUR Clear 07/05/2016 1525   LABSPEC 1.021 11/18/2016 1320   PHURINE 5.0 11/18/2016 1320   GLUCOSEU NEGATIVE 11/18/2016 1320   HGBUR NEGATIVE 11/18/2016 1320   BILIRUBINUR NEGATIVE 11/18/2016 1320   BILIRUBINUR Negative 07/05/2016 1525   KETONESUR NEGATIVE 11/18/2016 1320   PROTEINUR NEGATIVE 11/18/2016 1320   UROBILINOGEN  negative 06/29/2015 1609   UROBILINOGEN 0.2 09/17/2012 1456   NITRITE NEGATIVE 11/18/2016 1320   LEUKOCYTESUR NEGATIVE 11/18/2016 1320   LEUKOCYTESUR Negative 07/05/2016  1525    Radiological Exams on Admission: Ct Head Wo Contrast  Result Date: 11/18/2016 CLINICAL DATA:  66 year old female with fatigue, intermittent face arm and body numbness. Possible benzodiazepine withdrawal. EXAM: CT HEAD WITHOUT CONTRAST TECHNIQUE: Contiguous axial images were obtained from the base of the skull through the vertex without intravenous contrast. COMPARISON:  Report of brain MRI 04/16/2011 (no images available). FINDINGS: Brain: Cerebral volume is within normal limits for age. No midline shift, ventriculomegaly, mass effect, evidence of mass lesion, intracranial hemorrhage or evidence of cortically based acute infarction. Patchy mostly subcortical white matter hypodensity in both hemispheres. The extent is mild to moderate for age. No cortical encephalomalacia identified. Vascular: Calcified atherosclerosis at the skull base. No suspicious intracranial vascular hyperdensity. Skull: No acute osseous abnormality identified. Mild hyperostosis frontalis, normal variant. Sinuses/Orbits: Clear. Other: No acute orbit or scalp soft tissue findings; punctate retained metallic density foreign body in the forehead soft tissues on series 3, image 10. IMPRESSION: 1.  No acute intracranial abnormality. 2. Mild to moderate for age nonspecific cerebral white matter changes, most commonly due to chronic small vessel disease. Electronically Signed   By: Odessa Fleming [name]   On: 11/18/2016 13:43    EKG: Independently reviewed.   Assessment/Plan  Anxiety HTN Paresthesia HLD.   PLAN:   Anxiety:  Since she has been off benzodiazepine for a month now, she is in no danger of withdraw.  I actually told her that perhaps when she sees Dr Margo Aye, SSRI would be a better medicine for her ( Paroxetine, Fluoxetine, Sertraline, Citalopram, or  Escitalopram would be better).  Psychotherapy would be helpful.  Paresthesia:  The clinical presentation is unlikely to be TIA/CVA as it doesn't fit any vascular pattern, and its bilaterality and migratory is likely to be anxiety or hyperventilation.  She should continue with her ASA.  Thank you for asking me to see her.  Good Day.    Catera Hankins MD FACP. [facility]  If 7PM-7AM, please contact night-coverage [email or web address] Password [email or web address]  11/18/2016, 2:41 PM

## 2016-11-18 NOTE — Discharge Instructions (Signed)
Take your usual prescriptions as previously directed.  Call your regular medical doctor on Monday to schedule a follow up appointment within the next 2 days.  Return to the Emergency Department immediately sooner if worsening.  ° °

## 2016-11-18 NOTE — ED Triage Notes (Addendum)
Patient requesting a refill of Klonopin, has been out 1 month. States she has apt to see Dr. Margo AyeHall on Wednesday but unable to wait. Reports of fatigue, numbness "all over my body", chest pain and mouth sores since coming off medication.

## 2016-11-18 NOTE — ED Notes (Signed)
Pt states she has had intermittent numbness to several areas for two weeks. Reports L sided numbness since 0500 this morning. A&O X4, no slurred speech, or facial droop.

## 2016-11-18 NOTE — ED Notes (Signed)
Pt states understanding of d/c instructions and follow up care at this time. Denies further questions or concerns. Ambulatory with steady and even gait to lobby at this time, family driving pt home.

## 2016-11-21 ENCOUNTER — Other Ambulatory Visit: Payer: Self-pay | Admitting: Family Medicine

## 2016-11-22 ENCOUNTER — Emergency Department (HOSPITAL_COMMUNITY)
Admission: EM | Admit: 2016-11-22 | Discharge: 2016-11-22 | Disposition: A | Discharge status: Home Health | Payer: Medicare Other | Attending: Emergency Medicine | Admitting: Emergency Medicine

## 2016-11-22 ENCOUNTER — Encounter (HOSPITAL_COMMUNITY): Payer: Self-pay | Admitting: *Deleted

## 2016-11-22 DIAGNOSIS — Z7982 Long term (current) use of aspirin: Secondary | ICD-10-CM | POA: Insufficient documentation

## 2016-11-22 DIAGNOSIS — E039 Hypothyroidism, unspecified: Secondary | ICD-10-CM | POA: Diagnosis not present

## 2016-11-22 DIAGNOSIS — N3001 Acute cystitis with hematuria: Secondary | ICD-10-CM | POA: Insufficient documentation

## 2016-11-22 DIAGNOSIS — L600 Ingrowing nail: Secondary | ICD-10-CM | POA: Diagnosis present

## 2016-11-22 DIAGNOSIS — M79674 Pain in right toe(s): Secondary | ICD-10-CM | POA: Diagnosis not present

## 2016-11-22 DIAGNOSIS — Z79899 Other long term (current) drug therapy: Secondary | ICD-10-CM | POA: Insufficient documentation

## 2016-11-22 DIAGNOSIS — I10 Essential (primary) hypertension: Secondary | ICD-10-CM | POA: Diagnosis not present

## 2016-11-22 DIAGNOSIS — Z96651 Presence of right artificial knee joint: Secondary | ICD-10-CM | POA: Insufficient documentation

## 2016-11-22 LAB — URINALYSIS, ROUTINE W REFLEX MICROSCOPIC
BILIRUBIN URINE: NEGATIVE
GLUCOSE, UA: NEGATIVE mg/dL
NITRITE: POSITIVE — AB
PH: 6 (ref 5.0–8.0)
Protein, ur: 30 mg/dL — AB
SPECIFIC GRAVITY, URINE: 1.025 (ref 1.005–1.030)

## 2016-11-22 LAB — WET PREP, GENITAL
Clue Cells Wet Prep HPF POC: NONE SEEN
Sperm: NONE SEEN
Trich, Wet Prep: NONE SEEN
WBC, Wet Prep HPF POC: NONE SEEN
Yeast Wet Prep HPF POC: NONE SEEN

## 2016-11-22 LAB — URINALYSIS, MICROSCOPIC (REFLEX)

## 2016-11-22 MED ORDER — CEPHALEXIN 500 MG PO CAPS: 500.0000 mg | ORAL_CAPSULE | Freq: Four times a day (QID) | ORAL | 0 refills | Status: DC

## 2016-11-22 NOTE — Discharge Instructions (Signed)
You have a urinary tract infection. Please take antibiotics as prescribed.   Avoid cutting your toe nail too short.  A longer, square shaped nail will prevent ingrown toe nails or infections.  You may apply topical bacitracin to toe to prevent infection.    Follow up with your primary care provider for persistent symptoms

## 2016-11-22 NOTE — ED Notes (Signed)
Room setup for pelvic exam.

## 2016-11-22 NOTE — ED Provider Notes (Signed)
[facility] Provider Note   CSN: [id number] Arrival date & time: 11/22/16  1231     History   Chief Complaint Chief Complaint  Patient presents with  . Urinary Frequency  . Toe Pain    HPI [name] is a 66 y.o. female presents to ED with two concerns.   Patient reports "ingrown toe nail" of great right toe x 3 weeks.  Reports a new person cut her toe nails. Her daughter was able to express some pus out of it last week.  Some associated erythema and tenderness to lateral aspect of right great toe.    Patient reports strong urine odor, and "fishy" vaginal odor associated with urinary frequency and lower abdominal pain x few days.  Denies low back pain, fevers, n/v/d/c, vaginal bleeding or lesions. She has h/o recurrent UTIs, reports symptoms are similar. No recent sexual intercourse.   HPI  Past Medical History:  Diagnosis Date  . Anxiety   . Arthritis   . Borderline hypertension   . Chronic lower back pain   . Chronic shoulder pain   . Depression   . Fe deficiency anemia   . GERD (gastroesophageal reflux disease)   . Heartburn   . Hiatal hernia   . History of cardiac catheterization    Normal coronaries  . History of mixed drug abuse   . Hyperlipidemia   . Hypertension   . Hypothyroidism    [name]   . Lumbar herniated disc    L4-L5   . Microscopic hematuria   . Obesity   . Personal history of venous thrombosis and embolism   . PSVT (paroxysmal supraventricular tachycardia) (HCC) 8/05  . Recurrent cystitis    Dr. Aldean Ast     Patient Active Problem List   Diagnosis Date Noted  . Post-menopause 07/05/2016  . Benzodiazepine dependence (HCC) 05/07/2016  . Failed orthopedic implant (HCC)   . Fracture of ankle, trimalleolar, right, closed   . Depression 06/29/2015  . GAD (generalized anxiety disorder) 06/29/2015  . LVH (left ventricular hypertrophy) 03/24/2015  . Arthritis 03/24/2015  . Hypothyroidism   . Vitamin D deficiency 01/02/2013  .  Fatigue 10/03/2012  . Obesity 10/03/2012  . PSVT (paroxysmal supraventricular tachycardia) (HCC) 02/05/2012  . Right bundle branch block 02/03/2011  . Essential hypertension, benign 03/02/2009    Past Surgical History:  Procedure Laterality Date  . APPENDECTOMY  2004?or 11/03?  [name] BACK SURGERY    . BLADDER REPAIR    . CARPAL TUNNEL RELEASE     x2   . HARDWARE REMOVAL Right 02/18/2016   Procedure: HARDWARE REMOVAL;  Surgeon: Vickki Hearing, MD;  Location: [facility];  Service: Orthopedics;  Laterality: Right;  right ankle medial malleolar screw  . HARDWARE REMOVAL Right 03/08/2016   Procedure: HARDWARE REMOVAL AND INTERNAL FIXATION OF RIGHT MEDIAL MALLEOLUS  - RIGHT ANKLE REMOVAL OF MALLEOLAR SCREW, INSERTION OF 2  0.45 K WIRES;  Surgeon: Vickki Hearing, MD;  Location: [facility];  Service: Orthopedics;  Laterality: Right;  RIGHT ANKLE REMOVAL OF MALLEOLAR SCREW  . LIVER BIOPSY  2002   Dr. Katrinka Blazing   . ORIF ANKLE FRACTURE Right 02/02/2016   Procedure: OPEN REDUCTION INTERNAL FIXATION (ORIF) ANKLE FRACTURE;  Surgeon: Vickki Hearing, MD;  Location: [facility];  Service: Orthopedics;  Laterality: Right;  . ORIF ANKLE FRACTURE Right 02/18/2016   Procedure: OPEN REDUCTION INTERNAL FIXATION (ORIF) ANKLE FRACTURE;  Surgeon: Vickki Hearing, MD;  Location: [facility];  Service: Orthopedics;  Laterality:  Right;  reinsertion of medial malleolar screw  . PARTIAL HYSTERECTOMY  2004   One ovary   . RECTAL PROLAPSE REPAIR    . RECTOCELE REPAIR  05/2001  . Right knee arthroscopy  8/05  . Right knee replacement  8/05  . SHOULDER SURGERY      OB History    Gravida Para Term Preterm AB Living   6 5 5  0 1 5   SAB TAB Ectopic Multiple Live Births   1 0 0 0         Home Medications    Prior to Admission medications   Medication Sig Start Date End Date Taking? Authorizing Provider  AMITIZA 24 MCG capsule TAKE ONE CAPSULE BY MOUTH TWICE A DAY 07/14/16  Yes Mechele ClaudeStacks, Warren, MD  aspirin EC 81 MG tablet Take  81 mg by mouth daily.    Yes [provider]  atorvastatin (LIPITOR) 80 MG tablet TAKE ONE (1) TABLET EACH DAY 05/15/16  Yes Stacks, Broadus JohnWarren, MD  chlorthalidone (HYGROTON) 25 MG tablet TAKE ONE TABLET BY MOUTH TWICE DAILY 07/14/16  Yes Stacks, Broadus JohnWarren, MD  clonazePAM (KLONOPIN) 2 MG tablet TAKE 1 TABLET TWICE DAILY AS NEEDED FOR ANXIETY 04/17/16  Yes Stacks, Broadus JohnWarren, MD  cyclobenzaprine (FLEXERIL) 10 MG tablet TAKE ONE TABLET BY MOUTH THREE TIMES DAILY AS NEEDED FOR MUSCLE SPASM 07/14/16  Yes Mechele ClaudeStacks, Warren, MD  diclofenac (VOLTAREN) 75 MG EC tablet Take 75 mg by mouth 2 (two) times daily.   Yes [provider]  furosemide (LASIX) 20 MG tablet TAKE ONE (1) TABLET EACH DAY 08/10/16  Yes Mechele ClaudeStacks, Warren, MD  levothyroxine (SYNTHROID, LEVOTHROID) 75 MCG tablet TAKE ONE (1) TABLET EACH DAY 05/15/16  Yes Stacks, Broadus JohnWarren, MD  metoprolol succinate (TOPROL-XL) 50 MG 24 hr tablet TAKE ONE TABLET BY MOUTH TWICE DAILY 09/08/16  Yes Jonelle SidleMcDowell, Samuel G, MD  Omega-3 Fatty Acids (FISH OIL) 1200 MG CAPS Take 1 capsule by mouth 2 (two) times daily.   Yes [provider]  omeprazole (PRILOSEC) 20 MG capsule TAKE ONE CAPSULE BY MOUTH TWICE A DAY 07/06/16  Yes Stacks, Broadus JohnWarren, MD  potassium chloride (K-DUR) 10 MEQ tablet TAKE FIVE TABLETS TWICE DAILY 07/14/16  Yes Mechele ClaudeStacks, Warren, MD  PREMARIN vaginal cream APPLY ONCE WEEKLY 04/17/16  Yes Stacks, Broadus JohnWarren, MD  RESTASIS 0.05 % ophthalmic emulsion INSTILL ONE DROP IN BOTH EYES TWICE DAILY 01/11/16  Yes Mechele ClaudeStacks, Warren, MD  venlafaxine XR (EFFEXOR-XR) 75 MG 24 hr capsule TAKE 3 CAPSULES EVERY MORNING 08/10/16  Yes Mechele ClaudeStacks, Warren, MD  cephALEXin (KEFLEX) 500 MG capsule Take 1 capsule (500 mg total) by mouth 4 (four) times daily. 11/22/16   Liberty HandyGibbons, Claudia J, PA-C    Family History Family History  Problem Relation Age of Onset  . Breast cancer Mother   . Breast cancer Sister   . Heart attack Unknown        Family history   . Coronary artery disease Unknown         Family History     Social History Social History  Substance Use Topics  . Smoking status: Never Smoker  . Smokeless tobacco: Never Used  . Alcohol use No     Allergies   Clindamycin; Diphenhydramine hcl; and Naproxen   Review of Systems Review of Systems  Constitutional: Negative for fever.  Respiratory: Negative for shortness of breath.   Cardiovascular: Negative for chest pain.  Gastrointestinal: Positive for abdominal pain. Negative for constipation, diarrhea, nausea and vomiting.  Genitourinary: Positive for  frequency. Negative for difficulty urinating and hematuria.  Musculoskeletal: Positive for arthralgias (toe). Negative for back pain.     Physical Exam Updated Vital Signs BP (!) 158/91 (BP Location: Right Arm)   Pulse (!) 103   Temp 97.9 F (36.6 C) (Oral)   Ht 5\' 6"  (1.676 m)   Wt 97.5 kg (215 lb)   SpO2 96%   BMI 34.70 kg/m   Physical Exam  Constitutional: She is oriented to person, place, and time. She appears well-developed and well-nourished. No distress.  HENT:  Head: Normocephalic and atraumatic.  Nose: Nose normal.  Mouth/Throat: Oropharynx is clear and moist. No oropharyngeal exudate.  Eyes: Conjunctivae and EOM are normal. Pupils are equal, round, and reactive to light.  Neck: Normal range of motion. Neck supple.  Cardiovascular: Normal rate, regular rhythm, normal heart sounds and intact distal pulses.   No murmur heard. Pulmonary/Chest: Effort normal and breath sounds normal. No respiratory distress.  Abdominal: Soft. Bowel sounds are normal. She exhibits no distension and no mass. There is tenderness. There is no rebound and no guarding.  Suprapubic tenderness. No CVAT.  Genitourinary:  Genitourinary Comments:  Chaperone presents during exam. External genitalia normal without erythema, edema, tenderness, discharge or lesions.  No groin lymphadenopathy.  Vaginal mucosa and cervix normal, pink without discharge or lesions.  Uterus in  midline, smooth, not enlarged or tender. No CMT. Non palpable adnexa.  Musculoskeletal: Normal range of motion. She exhibits no deformity.  Lymphadenopathy:    She has no cervical adenopathy.  Neurological: She is alert and oriented to person, place, and time. No sensory deficit.  Skin: Skin is warm and dry. Capillary refill takes less than 2 seconds.  Very mild erythema and tenderness to lateral aspect of right great toe.  Sharp edge of nail embedded into lateral nail fold. No purulent drainage or bleeding. No fluctuance or streaky erythema.   Psychiatric: She has a normal mood and affect. Her behavior is normal. Judgment and thought content normal.  Nursing note and vitals reviewed.    ED Treatments / Results  Labs (all labs ordered are listed, but only abnormal results are displayed) Labs Reviewed  URINALYSIS, ROUTINE W REFLEX MICROSCOPIC - Abnormal; Notable for the following:       Result Value   APPearance CLOUDY (*)    Hgb urine dipstick SMALL (*)    Ketones, ur TRACE (*)    Protein, ur 30 (*)    Nitrite POSITIVE (*)    Leukocytes, UA LARGE (*)    All other components within normal limits  URINALYSIS, MICROSCOPIC (REFLEX) - Abnormal; Notable for the following:    Bacteria, UA MANY (*)    Squamous Epithelial / LPF 6-30 (*)    All other components within normal limits  WET PREP, GENITAL  URINE CULTURE  GC/CHLAMYDIA PROBE AMP ([place]) NOT AT [facility]    EKG  EKG Interpretation None       Radiology No results found.  Procedures Procedures (including critical care time)  Medications Ordered in ED Medications - No data to display   Initial Impression / Assessment and Plan / ED Course  I have reviewed the triage vital signs and the nursing notes.  Pertinent labs & imaging results that were available during my care of the patient were reviewed by me and considered in my medical decision making (see chart for details).  Clinical Course as of Nov 22 2200  Wed Nov 22, 2016  1424 Nitrite: (!) POSITIVE [CG]  1424 Leukocytes, UA: (!) LARGE [CG]  1424 Bacteria, UA: (!) MANY [CG]  1424 WBC, UA: TOO NUMEROUS TO COUNT [CG]    Clinical Course User Index [CG] Liberty Handy, PA-C   66 yo female presents with urinary frequency, suprapubic tenderness, dark and foul smelling urine.  U/A consistent with acute cystitis with hematuria.  Wet prep unremarkable. Pelvic exam reassuring. No CVAT or fever to suggest pyelonephritis.  Will d/c with antibiotic, urine culture sent.   Patient with mild right great toe pain due to sharp corner of nail embedded into lateral fold. Nail was trimmed which alleviated symptoms completely.  No signs of abscess or cellulitis.    Patient considered safe for discharge. ED return precautions given. Patient verbalized understanding and is agreeable with plan.   Final Clinical Impressions(s) / ED Diagnoses   Final diagnoses:  Acute cystitis with hematuria  Pain of right great toe    New Prescriptions Discharge Medication List as of 11/22/2016  3:28 PM    START taking these medications   Details  cephALEXin (KEFLEX) 500 MG capsule Take 1 capsule (500 mg total) by mouth 4 (four) times daily., Starting Wed 11/22/2016, Print         Liberty Handy, PA-C 11/22/16 2202    Samuel Jester, DO 11/25/16 1918

## 2016-11-22 NOTE — ED Triage Notes (Signed)
Pt comes in with right foot toe pain starting 3 weeks. She believes the toe nail needs to come off. Also, pt is having urinary frequency and vaginal odor. Denies any discharge or itching.

## 2016-11-23 LAB — GC/CHLAMYDIA PROBE AMP (~~LOC~~) NOT AT ARMC
Chlamydia: NEGATIVE
Neisseria Gonorrhea: NEGATIVE

## 2016-11-25 LAB — URINE CULTURE: Culture: >=100000 — AB

## 2016-11-26 ENCOUNTER — Telehealth: Payer: Self-pay

## 2016-11-26 NOTE — Telephone Encounter (Signed)
Post ED Visit - Positive Culture Follow-up  Culture report reviewed by antimicrobial stewardship pharmacist:  []  Enzo BiNathan Batchelder, Pharm.D. []  Celedonio MiyamotoJeremy Frens, Pharm.D., BCPS AQ-ID [x]  Garvin FilaMike Maccia, Pharm.D., BCPS []  Georgina PillionElizabeth Martin, Pharm.D., BCPS []  Harrison CityMinh Pham, [address] Rainbow BoulevardPharm.D., BCPS, AAHIVP []  Estella HuskMichelle Turner, Pharm.D., BCPS, AAHIVP []  Lysle Pearlachel Rumbarger, PharmD, BCPS []  Casilda Carlsaylor Stone, PharmD, BCPS []  Pollyann SamplesAndy Johnston, PharmD, BCPS  Positive  urine culture Treated with Cephalexin, organism sensitive to the same and no further patient follow-up is required at this time.  Jerry CarasCullom, Jo-Anne Kluth Burnett 11/26/2016, 11:35 AM

## 2016-11-29 ENCOUNTER — Other Ambulatory Visit: Payer: Self-pay | Admitting: Family Medicine

## 2016-12-08 DIAGNOSIS — F419 Anxiety disorder, unspecified: Secondary | ICD-10-CM | POA: Diagnosis not present

## 2016-12-08 DIAGNOSIS — I10 Essential (primary) hypertension: Secondary | ICD-10-CM | POA: Diagnosis not present

## 2016-12-08 DIAGNOSIS — E039 Hypothyroidism, unspecified: Secondary | ICD-10-CM | POA: Diagnosis not present

## 2016-12-08 DIAGNOSIS — F339 Major depressive disorder, recurrent, unspecified: Secondary | ICD-10-CM | POA: Diagnosis not present

## 2016-12-08 DIAGNOSIS — Z6834 Body mass index (BMI) 34.0-34.9, adult: Secondary | ICD-10-CM | POA: Diagnosis not present

## 2016-12-08 DIAGNOSIS — E782 Mixed hyperlipidemia: Secondary | ICD-10-CM | POA: Diagnosis not present

## 2016-12-08 DIAGNOSIS — K588 Other irritable bowel syndrome: Secondary | ICD-10-CM | POA: Diagnosis not present

## 2016-12-20 DIAGNOSIS — I10 Essential (primary) hypertension: Secondary | ICD-10-CM | POA: Diagnosis not present

## 2016-12-20 DIAGNOSIS — Z1159 Encounter for screening for other viral diseases: Secondary | ICD-10-CM | POA: Diagnosis not present

## 2016-12-20 DIAGNOSIS — E039 Hypothyroidism, unspecified: Secondary | ICD-10-CM | POA: Diagnosis not present

## 2017-01-03 DIAGNOSIS — K588 Other irritable bowel syndrome: Secondary | ICD-10-CM | POA: Diagnosis not present

## 2017-01-03 DIAGNOSIS — E039 Hypothyroidism, unspecified: Secondary | ICD-10-CM | POA: Diagnosis not present

## 2017-01-03 DIAGNOSIS — F5101 Primary insomnia: Secondary | ICD-10-CM | POA: Diagnosis not present

## 2017-01-03 DIAGNOSIS — F339 Major depressive disorder, recurrent, unspecified: Secondary | ICD-10-CM | POA: Diagnosis not present

## 2017-01-03 DIAGNOSIS — Z6834 Body mass index (BMI) 34.0-34.9, adult: Secondary | ICD-10-CM | POA: Diagnosis not present

## 2017-01-03 DIAGNOSIS — F419 Anxiety disorder, unspecified: Secondary | ICD-10-CM | POA: Diagnosis not present

## 2017-01-03 DIAGNOSIS — I10 Essential (primary) hypertension: Secondary | ICD-10-CM | POA: Diagnosis not present

## 2017-01-03 DIAGNOSIS — Z79899 Other long term (current) drug therapy: Secondary | ICD-10-CM | POA: Diagnosis not present

## 2017-01-03 DIAGNOSIS — N390 Urinary tract infection, site not specified: Secondary | ICD-10-CM | POA: Diagnosis not present

## 2017-01-03 DIAGNOSIS — E782 Mixed hyperlipidemia: Secondary | ICD-10-CM | POA: Diagnosis not present

## 2017-01-18 DIAGNOSIS — F339 Major depressive disorder, recurrent, unspecified: Secondary | ICD-10-CM | POA: Diagnosis not present

## 2017-01-18 DIAGNOSIS — F5101 Primary insomnia: Secondary | ICD-10-CM | POA: Diagnosis not present

## 2017-01-18 DIAGNOSIS — F419 Anxiety disorder, unspecified: Secondary | ICD-10-CM | POA: Diagnosis not present

## 2017-01-18 DIAGNOSIS — Z6834 Body mass index (BMI) 34.0-34.9, adult: Secondary | ICD-10-CM | POA: Diagnosis not present

## 2017-02-01 ENCOUNTER — Other Ambulatory Visit: Payer: Self-pay | Admitting: Orthopedic Surgery

## 2017-02-15 DIAGNOSIS — F5101 Primary insomnia: Secondary | ICD-10-CM | POA: Diagnosis not present

## 2017-02-15 DIAGNOSIS — M8580 Other specified disorders of bone density and structure, unspecified site: Secondary | ICD-10-CM | POA: Diagnosis not present

## 2017-02-15 DIAGNOSIS — Z6834 Body mass index (BMI) 34.0-34.9, adult: Secondary | ICD-10-CM | POA: Diagnosis not present

## 2017-02-15 DIAGNOSIS — F339 Major depressive disorder, recurrent, unspecified: Secondary | ICD-10-CM | POA: Diagnosis not present

## 2017-02-15 DIAGNOSIS — F419 Anxiety disorder, unspecified: Secondary | ICD-10-CM | POA: Diagnosis not present

## 2017-03-08 ENCOUNTER — Other Ambulatory Visit: Payer: Self-pay | Admitting: Family Medicine

## 2017-03-16 ENCOUNTER — Other Ambulatory Visit: Payer: Self-pay | Admitting: Family Medicine

## 2017-04-30 DIAGNOSIS — E039 Hypothyroidism, unspecified: Secondary | ICD-10-CM | POA: Diagnosis not present

## 2017-04-30 DIAGNOSIS — Z23 Encounter for immunization: Secondary | ICD-10-CM | POA: Diagnosis not present

## 2017-04-30 DIAGNOSIS — I10 Essential (primary) hypertension: Secondary | ICD-10-CM | POA: Diagnosis not present

## 2017-04-30 DIAGNOSIS — E782 Mixed hyperlipidemia: Secondary | ICD-10-CM | POA: Diagnosis not present

## 2017-05-02 ENCOUNTER — Other Ambulatory Visit (HOSPITAL_COMMUNITY): Payer: Self-pay | Admitting: Internal Medicine

## 2017-05-02 ENCOUNTER — Ambulatory Visit (HOSPITAL_COMMUNITY)
Admission: RE | Admit: 2017-05-02 | Discharge: 2017-05-02 | Disposition: A | Discharge status: Home / Self Care | Payer: Medicare Other | Source: Ambulatory Visit | Attending: Internal Medicine | Admitting: Internal Medicine

## 2017-05-02 ENCOUNTER — Other Ambulatory Visit (HOSPITAL_COMMUNITY): Payer: Self-pay | Admitting: Adult Health Nurse Practitioner

## 2017-05-02 DIAGNOSIS — M8580 Other specified disorders of bone density and structure, unspecified site: Secondary | ICD-10-CM | POA: Diagnosis not present

## 2017-05-02 DIAGNOSIS — F339 Major depressive disorder, recurrent, unspecified: Secondary | ICD-10-CM | POA: Diagnosis not present

## 2017-05-02 DIAGNOSIS — W19XXXA Unspecified fall, initial encounter: Secondary | ICD-10-CM

## 2017-05-02 DIAGNOSIS — F5101 Primary insomnia: Secondary | ICD-10-CM | POA: Diagnosis not present

## 2017-05-02 DIAGNOSIS — K588 Other irritable bowel syndrome: Secondary | ICD-10-CM | POA: Diagnosis not present

## 2017-05-02 DIAGNOSIS — Z6836 Body mass index (BMI) 36.0-36.9, adult: Secondary | ICD-10-CM | POA: Diagnosis not present

## 2017-05-02 DIAGNOSIS — M7989 Other specified soft tissue disorders: Secondary | ICD-10-CM | POA: Insufficient documentation

## 2017-05-02 DIAGNOSIS — R300 Dysuria: Secondary | ICD-10-CM | POA: Diagnosis not present

## 2017-05-02 DIAGNOSIS — E782 Mixed hyperlipidemia: Secondary | ICD-10-CM | POA: Diagnosis not present

## 2017-05-02 DIAGNOSIS — I10 Essential (primary) hypertension: Secondary | ICD-10-CM | POA: Diagnosis not present

## 2017-05-02 DIAGNOSIS — M25571 Pain in right ankle and joints of right foot: Secondary | ICD-10-CM | POA: Diagnosis not present

## 2017-05-02 DIAGNOSIS — Z9181 History of falling: Secondary | ICD-10-CM | POA: Diagnosis present

## 2017-05-02 DIAGNOSIS — Z96651 Presence of right artificial knee joint: Secondary | ICD-10-CM | POA: Insufficient documentation

## 2017-05-02 DIAGNOSIS — E039 Hypothyroidism, unspecified: Secondary | ICD-10-CM | POA: Diagnosis not present

## 2017-05-02 DIAGNOSIS — F419 Anxiety disorder, unspecified: Secondary | ICD-10-CM | POA: Diagnosis not present

## 2017-05-02 DIAGNOSIS — M25561 Pain in right knee: Secondary | ICD-10-CM | POA: Diagnosis not present

## 2017-06-06 DIAGNOSIS — J019 Acute sinusitis, unspecified: Secondary | ICD-10-CM | POA: Diagnosis not present

## 2017-06-06 DIAGNOSIS — Z6835 Body mass index (BMI) 35.0-35.9, adult: Secondary | ICD-10-CM | POA: Diagnosis not present

## 2017-06-06 DIAGNOSIS — J209 Acute bronchitis, unspecified: Secondary | ICD-10-CM | POA: Diagnosis not present

## 2017-06-06 DIAGNOSIS — R070 Pain in throat: Secondary | ICD-10-CM | POA: Diagnosis not present

## 2017-06-11 DIAGNOSIS — Z712 Person consulting for explanation of examination or test findings: Secondary | ICD-10-CM | POA: Diagnosis not present

## 2017-06-11 DIAGNOSIS — Z6835 Body mass index (BMI) 35.0-35.9, adult: Secondary | ICD-10-CM | POA: Diagnosis not present

## 2017-06-11 DIAGNOSIS — F419 Anxiety disorder, unspecified: Secondary | ICD-10-CM | POA: Diagnosis not present

## 2017-06-11 DIAGNOSIS — F339 Major depressive disorder, recurrent, unspecified: Secondary | ICD-10-CM | POA: Diagnosis not present

## 2017-08-08 ENCOUNTER — Other Ambulatory Visit: Payer: Self-pay | Admitting: Internal Medicine

## 2017-08-08 DIAGNOSIS — Z1231 Encounter for screening mammogram for malignant neoplasm of breast: Secondary | ICD-10-CM

## 2017-08-16 DIAGNOSIS — Z712 Person consulting for explanation of examination or test findings: Secondary | ICD-10-CM | POA: Diagnosis not present

## 2017-08-16 DIAGNOSIS — E782 Mixed hyperlipidemia: Secondary | ICD-10-CM | POA: Diagnosis not present

## 2017-08-16 DIAGNOSIS — E039 Hypothyroidism, unspecified: Secondary | ICD-10-CM | POA: Diagnosis not present

## 2017-08-16 DIAGNOSIS — F419 Anxiety disorder, unspecified: Secondary | ICD-10-CM | POA: Diagnosis not present

## 2017-08-16 DIAGNOSIS — F5101 Primary insomnia: Secondary | ICD-10-CM | POA: Diagnosis not present

## 2017-08-16 DIAGNOSIS — I10 Essential (primary) hypertension: Secondary | ICD-10-CM | POA: Diagnosis not present

## 2017-08-16 DIAGNOSIS — Z6835 Body mass index (BMI) 35.0-35.9, adult: Secondary | ICD-10-CM | POA: Diagnosis not present

## 2017-08-16 DIAGNOSIS — F339 Major depressive disorder, recurrent, unspecified: Secondary | ICD-10-CM | POA: Diagnosis not present

## 2017-08-16 DIAGNOSIS — Z23 Encounter for immunization: Secondary | ICD-10-CM | POA: Diagnosis not present

## 2017-08-16 DIAGNOSIS — K588 Other irritable bowel syndrome: Secondary | ICD-10-CM | POA: Diagnosis not present

## 2017-08-20 DIAGNOSIS — K588 Other irritable bowel syndrome: Secondary | ICD-10-CM | POA: Diagnosis not present

## 2017-08-20 DIAGNOSIS — Z6835 Body mass index (BMI) 35.0-35.9, adult: Secondary | ICD-10-CM | POA: Diagnosis not present

## 2017-08-20 DIAGNOSIS — F419 Anxiety disorder, unspecified: Secondary | ICD-10-CM | POA: Diagnosis not present

## 2017-08-20 DIAGNOSIS — F339 Major depressive disorder, recurrent, unspecified: Secondary | ICD-10-CM | POA: Diagnosis not present

## 2017-08-20 DIAGNOSIS — E782 Mixed hyperlipidemia: Secondary | ICD-10-CM | POA: Diagnosis not present

## 2017-08-20 DIAGNOSIS — E039 Hypothyroidism, unspecified: Secondary | ICD-10-CM | POA: Diagnosis not present

## 2017-08-20 DIAGNOSIS — Z23 Encounter for immunization: Secondary | ICD-10-CM | POA: Diagnosis not present

## 2017-08-20 DIAGNOSIS — R300 Dysuria: Secondary | ICD-10-CM | POA: Diagnosis not present

## 2017-08-20 DIAGNOSIS — I10 Essential (primary) hypertension: Secondary | ICD-10-CM | POA: Diagnosis not present

## 2017-08-20 DIAGNOSIS — Z712 Person consulting for explanation of examination or test findings: Secondary | ICD-10-CM | POA: Diagnosis not present

## 2017-08-20 DIAGNOSIS — F5101 Primary insomnia: Secondary | ICD-10-CM | POA: Diagnosis not present

## 2017-08-20 DIAGNOSIS — H6502 Acute serous otitis media, left ear: Secondary | ICD-10-CM | POA: Diagnosis not present

## 2017-08-20 DIAGNOSIS — N390 Urinary tract infection, site not specified: Secondary | ICD-10-CM | POA: Diagnosis not present

## 2017-08-30 DIAGNOSIS — Z8781 Personal history of (healed) traumatic fracture: Secondary | ICD-10-CM

## 2017-08-30 DIAGNOSIS — Z9889 Other specified postprocedural states: Secondary | ICD-10-CM | POA: Insufficient documentation

## 2017-08-31 ENCOUNTER — Ambulatory Visit: Payer: Medicare Other | Admitting: Orthopedic Surgery

## 2017-08-31 NOTE — Progress Notes (Deleted)
NEW PATIENT OFFICE VISIT   No chief complaint on file.   HPI  ROS   Past Medical History:  Diagnosis Date  . Anxiety   . Arthritis   . Borderline hypertension   . Chronic lower back pain   . Chronic shoulder pain   . Depression   . Fe deficiency anemia   . GERD (gastroesophageal reflux disease)   . Heartburn   . Hiatal hernia   . History of cardiac catheterization    Normal coronaries  . History of mixed drug abuse   . Hyperlipidemia   . Hypertension   . Hypothyroidism    Dr. Annie MainBaler   . Lumbar herniated disc    L4-L5   . Microscopic hematuria   . Obesity   . Personal history of venous thrombosis and embolism   . PSVT (paroxysmal supraventricular tachycardia) (HCC) 8/05  . Recurrent cystitis    Dr. Aldean AstKimbrough     Past Surgical History:  Procedure Laterality Date  . APPENDECTOMY  2004?or 11/03?  [name]. BACK SURGERY    . BLADDER REPAIR    . CARPAL TUNNEL RELEASE     x2   . HARDWARE REMOVAL Right 02/18/2016   Procedure: HARDWARE REMOVAL;  Surgeon: Vickki HearingStanley E Angelyne Terwilliger, MD;  Location: [facility];  Service: Orthopedics;  Laterality: Right;  right ankle medial malleolar screw  . HARDWARE REMOVAL Right 03/08/2016   Procedure: HARDWARE REMOVAL AND INTERNAL FIXATION OF RIGHT MEDIAL MALLEOLUS  - RIGHT ANKLE REMOVAL OF MALLEOLAR SCREW, INSERTION OF 2  0.45 K WIRES;  Surgeon: Vickki HearingStanley E Aitana Burry, MD;  Location: [facility];  Service: Orthopedics;  Laterality: Right;  RIGHT ANKLE REMOVAL OF MALLEOLAR SCREW  . LIVER BIOPSY  2002   Dr. Katrinka BlazingSmith   . ORIF ANKLE FRACTURE Right 02/02/2016   Procedure: OPEN REDUCTION INTERNAL FIXATION (ORIF) ANKLE FRACTURE;  Surgeon: Vickki HearingStanley E Angelys Yetman, MD;  Location: [facility];  Service: Orthopedics;  Laterality: Right;  . ORIF ANKLE FRACTURE Right 02/18/2016   Procedure: OPEN REDUCTION INTERNAL FIXATION (ORIF) ANKLE FRACTURE;  Surgeon: Vickki HearingStanley E Analissa Bayless, MD;  Location: [facility];  Service: Orthopedics;  Laterality: Right;  reinsertion of medial malleolar screw  . PARTIAL  HYSTERECTOMY  2004   One ovary   . RECTAL PROLAPSE REPAIR    . RECTOCELE REPAIR  05/2001  . Right knee arthroscopy  8/05  . Right knee replacement  8/05  . SHOULDER SURGERY      Family History  Problem Relation Age of Onset  . Breast cancer Mother   . Breast cancer Sister   . Heart attack Unknown        Family history   . Coronary artery disease Unknown        Family History    Social History   Tobacco Use  . Smoking status: Never Smoker  . Smokeless tobacco: Never Used  Substance Use Topics  . Alcohol use: No    Alcohol/week: 0.0 oz  . Drug use: No    Comment: history of abuse     @ALL @  No outpatient medications have been marked as taking for the 08/31/17 encounter (Appointment) with Vickki HearingHarrison, Jacelyn Cuen E, MD.    There were no vitals taken for this visit.  Physical Exam  Ortho Exam  MEDICAL DECISION SECTION  xrays ordered? ***  My independent reading of xrays: ***   Encounter Diagnosis  Name Primary?  . S/P ORIF (open reduction internal fixation) fracture right ankle 02/02/16 removal hardware 03/08/16      PLAN:  No orders of the defined types were placed in this encounter.  Injection? *** MRI/CT/? ***

## 2017-09-11 ENCOUNTER — Ambulatory Visit: Payer: Medicare Other | Admitting: Orthopedic Surgery

## 2017-09-11 ENCOUNTER — Encounter: Payer: Self-pay | Admitting: Orthopedic Surgery

## 2017-09-12 ENCOUNTER — Ambulatory Visit: Payer: Medicare Other | Admitting: Orthopedic Surgery

## 2017-10-12 NOTE — Progress Notes (Deleted)
Cardiology Office Note  Date: 10/12/2017   ID: [name], DOB Oct 13, 1950, MRN [id number]  PCP: Benita Stabile, MD  Primary Cardiologist: Nona Dell, MD   No chief complaint on file.   History of Present Illness: [name] is a 67 y.o. female last seen in May 2018.  Past Medical History:  Diagnosis Date  . Anxiety   . Arthritis   . Borderline hypertension   . Chronic lower back pain   . Chronic shoulder pain   . Depression   . Fe deficiency anemia   . GERD (gastroesophageal reflux disease)   . Heartburn   . Hiatal hernia   . History of cardiac catheterization    Normal coronaries  . History of mixed drug abuse   . Hyperlipidemia   . Hypertension   . Hypothyroidism    [name]   . Lumbar herniated disc    L4-L5   . Microscopic hematuria   . Obesity   . Personal history of venous thrombosis and embolism   . PSVT (paroxysmal supraventricular tachycardia) (HCC) 8/05  . Recurrent cystitis    Dr. Aldean Ast     Past Surgical History:  Procedure Laterality Date  . APPENDECTOMY  2004?or 11/03?  [name] BACK SURGERY    . BLADDER REPAIR    . CARPAL TUNNEL RELEASE     x2   . HARDWARE REMOVAL Right 02/18/2016   Procedure: HARDWARE REMOVAL;  Surgeon: Vickki Hearing, MD;  Location: [facility];  Service: Orthopedics;  Laterality: Right;  right ankle medial malleolar screw  . HARDWARE REMOVAL Right 03/08/2016   Procedure: HARDWARE REMOVAL AND INTERNAL FIXATION OF RIGHT MEDIAL MALLEOLUS  - RIGHT ANKLE REMOVAL OF MALLEOLAR SCREW, INSERTION OF 2  0.45 K WIRES;  Surgeon: Vickki Hearing, MD;  Location: [facility];  Service: Orthopedics;  Laterality: Right;  RIGHT ANKLE REMOVAL OF MALLEOLAR SCREW  . LIVER BIOPSY  2002   Dr. Katrinka Blazing   . ORIF ANKLE FRACTURE Right 02/02/2016   Procedure: OPEN REDUCTION INTERNAL FIXATION (ORIF) ANKLE FRACTURE;  Surgeon: Vickki Hearing, MD;  Location: [facility];  Service: Orthopedics;  Laterality: Right;  . ORIF ANKLE FRACTURE Right 02/18/2016   Procedure: OPEN REDUCTION INTERNAL FIXATION (ORIF) ANKLE FRACTURE;  Surgeon: Vickki Hearing, MD;  Location: [facility];  Service: Orthopedics;  Laterality: Right;  reinsertion of medial malleolar screw  . PARTIAL HYSTERECTOMY  2004   One ovary   . RECTAL PROLAPSE REPAIR    . RECTOCELE REPAIR  05/2001  . Right knee arthroscopy  8/05  . Right knee replacement  8/05  . SHOULDER SURGERY      Current Outpatient Medications  Medication Sig Dispense Refill  . AMITIZA 24 MCG capsule TAKE ONE CAPSULE BY MOUTH TWICE A DAY 60 capsule 5  . aspirin EC 81 MG tablet Take 81 mg by mouth daily.     [name] atorvastatin (LIPITOR) 80 MG tablet TAKE ONE (1) TABLET EACH DAY 90 tablet 1  . cephALEXin (KEFLEX) 500 MG capsule Take 1 capsule (500 mg total) by mouth 4 (four) times daily. 20 capsule 0  . chlorthalidone (HYGROTON) 25 MG tablet TAKE ONE TABLET BY MOUTH TWICE DAILY 60 tablet 5  . clonazePAM (KLONOPIN) 2 MG tablet TAKE 1 TABLET TWICE DAILY AS NEEDED FOR ANXIETY 60 tablet 5  . cyclobenzaprine (FLEXERIL) 10 MG tablet TAKE ONE TABLET BY MOUTH THREE TIMES DAILY AS NEEDED FOR MUSCLE SPASM 90 tablet 5  . diclofenac (VOLTAREN) 75 MG EC tablet  TAKE ONE TABLET BY MOUTH TWICE DAILY 180 tablet 2  . furosemide (LASIX) 20 MG tablet TAKE ONE (1) TABLET EACH DAY 30 tablet 2  . levothyroxine (SYNTHROID, LEVOTHROID) 75 MCG tablet TAKE ONE (1) TABLET EACH DAY 90 tablet 3  . metoprolol succinate (TOPROL-XL) 50 MG 24 hr tablet TAKE ONE TABLET BY MOUTH TWICE DAILY 180 tablet 0  . Omega-3 Fatty Acids (FISH OIL) 1200 MG CAPS Take 1 capsule by mouth 2 (two) times daily.    [name] omeprazole (PRILOSEC) 20 MG capsule TAKE ONE CAPSULE BY MOUTH TWICE A DAY 60 capsule 5  . potassium chloride (K-DUR) 10 MEQ tablet TAKE FIVE TABLETS TWICE DAILY 300 tablet 2  . PREMARIN vaginal cream APPLY ONCE WEEKLY 30 g 3  . RESTASIS 0.05 % ophthalmic emulsion INSTILL ONE DROP IN BOTH EYES TWICE DAILY 60 each 11  . venlafaxine XR (EFFEXOR-XR) 75 MG 24 hr  capsule TAKE 3 CAPSULES EVERY MORNING 90 capsule 1   No current facility-administered medications for this visit.    Allergies:  Clindamycin; Diphenhydramine hcl; and Naproxen   Social History: The patient  reports that she has never smoked. She has never used smokeless tobacco. She reports that she does not drink alcohol or use drugs.   Family History: The patient's family history includes Breast cancer in her mother and sister; Coronary artery disease in her unknown relative; Heart attack in her unknown relative.   ROS:  Please see the history of present illness. Otherwise, complete review of systems is positive for {NONE DEFAULTED:18576::"none"}.  All other systems are reviewed and negative.   Physical Exam: VS:  There were no vitals taken for this visit., BMI There is no height or weight on file to calculate BMI.  Wt Readings from Last 3 Encounters:  11/22/16 215 lb (97.5 kg)  11/18/16 215 lb (97.5 kg)  10/10/16 224 lb 12.8 oz (102 kg)    General: Patient appears comfortable at rest. HEENT: Conjunctiva and lids normal, oropharynx clear with moist mucosa. Neck: Supple, no elevated JVP or carotid bruits, no thyromegaly. Lungs: Clear to auscultation, nonlabored breathing at rest. Cardiac: Regular rate and rhythm, no S3 or significant systolic murmur, no pericardial rub. Abdomen: Soft, nontender, no hepatomegaly, bowel sounds present, no guarding or rebound. Extremities: No pitting edema, distal pulses 2+. Skin: Warm and dry. Musculoskeletal: No kyphosis. Neuropsychiatric: Alert and oriented x3, affect grossly appropriate.  ECG: I personally reviewed the tracing from 11/18/2016 which showed sinus rhythm with right bundle branch block.  Recent Labwork: 11/18/2016: ALT 29; AST 29; BUN 27; Creatinine, Ser 0.96; Hemoglobin 12.8; Platelets 249; Potassium 3.2; Sodium 137     Component Value Date/Time   CHOL 326 (H) 07/05/2016 1459   CHOL 166 01/02/2013 1146   TRIG 202 (H) 07/05/2016 1459    TRIG 122 10/26/2014 1655   TRIG 103 01/02/2013 1146   HDL 60 07/05/2016 1459   HDL 72 10/26/2014 1655   HDL 75 01/02/2013 1146   CHOLHDL 5.4 (H) 07/05/2016 1459   LDLCALC 226 (H) 07/05/2016 1459   LDLCALC 57 12/09/2013 1048   LDLCALC 70 01/02/2013 1146    Other Studies Reviewed Today:  Echocardiogram 12/19/2012: Study Conclusions  - Left ventricle: There was mild concentric hypertrophy. Systolic function was normal. The estimated ejection fraction was in the range of 55% to 60%. Left ventricular diastolic function parameters were normal. - Left atrium: The atrium was mildly dilated. - Tricuspid valve: Mild regurgitation.  Assessment and Plan:    Current medicines  were reviewed with the patient today.  No orders of the defined types were placed in this encounter.   Disposition:  Signed, Jonelle Sidle, MD, [facility] 10/12/2017 3:34 PM    [facility] [address] [place], [place], [place] [address] Phone: [phone]; Fax: [phone]

## 2017-10-15 ENCOUNTER — Ambulatory Visit: Payer: Medicare Other | Admitting: Cardiology

## 2017-10-29 ENCOUNTER — Ambulatory Visit: Payer: Medicare Other

## 2017-11-14 ENCOUNTER — Ambulatory Visit: Payer: Medicare Other | Admitting: Orthopedic Surgery

## 2017-11-16 DIAGNOSIS — R300 Dysuria: Secondary | ICD-10-CM | POA: Diagnosis not present

## 2017-11-16 DIAGNOSIS — K588 Other irritable bowel syndrome: Secondary | ICD-10-CM | POA: Diagnosis not present

## 2017-11-16 DIAGNOSIS — F5101 Primary insomnia: Secondary | ICD-10-CM | POA: Diagnosis not present

## 2017-11-16 DIAGNOSIS — E782 Mixed hyperlipidemia: Secondary | ICD-10-CM | POA: Diagnosis not present

## 2017-11-16 DIAGNOSIS — Z6835 Body mass index (BMI) 35.0-35.9, adult: Secondary | ICD-10-CM | POA: Diagnosis not present

## 2017-11-16 DIAGNOSIS — Z23 Encounter for immunization: Secondary | ICD-10-CM | POA: Diagnosis not present

## 2017-11-16 DIAGNOSIS — Z712 Person consulting for explanation of examination or test findings: Secondary | ICD-10-CM | POA: Diagnosis not present

## 2017-11-16 DIAGNOSIS — E039 Hypothyroidism, unspecified: Secondary | ICD-10-CM | POA: Diagnosis not present

## 2017-11-16 DIAGNOSIS — F339 Major depressive disorder, recurrent, unspecified: Secondary | ICD-10-CM | POA: Diagnosis not present

## 2017-11-16 DIAGNOSIS — F419 Anxiety disorder, unspecified: Secondary | ICD-10-CM | POA: Diagnosis not present

## 2017-11-16 DIAGNOSIS — I10 Essential (primary) hypertension: Secondary | ICD-10-CM | POA: Diagnosis not present

## 2017-11-16 DIAGNOSIS — Z6837 Body mass index (BMI) 37.0-37.9, adult: Secondary | ICD-10-CM | POA: Diagnosis not present

## 2017-11-28 ENCOUNTER — Inpatient Hospital Stay: Admission: RE | Admit: 2017-11-28 | Payer: Medicare Other | Source: Ambulatory Visit

## 2017-12-06 ENCOUNTER — Encounter: Payer: Self-pay | Admitting: *Deleted

## 2017-12-07 ENCOUNTER — Ambulatory Visit: Payer: Medicare Other | Admitting: Orthopedic Surgery

## 2017-12-07 ENCOUNTER — Encounter: Payer: Self-pay | Admitting: *Deleted

## 2017-12-07 ENCOUNTER — Ambulatory Visit: Payer: Medicare Other | Admitting: Cardiology

## 2017-12-07 ENCOUNTER — Encounter: Payer: Self-pay | Admitting: Orthopedic Surgery

## 2017-12-07 NOTE — Progress Notes (Deleted)
Cardiology Office Note  Date: 12/07/2017   ID: [name], DOB 04-24-1951, MRN [id number]  PCP: Benita Stabile, MD  Primary Cardiologist: Nona Dell, MD   No chief complaint on file.   History of Present Illness: [name] is a 67 y.o. female last seen in May 2018.  Past Medical History:  Diagnosis Date  . Anxiety   . Arthritis   . Borderline hypertension   . Chronic lower back pain   . Chronic shoulder pain   . Depression   . Fe deficiency anemia   . GERD (gastroesophageal reflux disease)   . Heartburn   . Hiatal hernia   . History of cardiac catheterization    Normal coronaries  . History of mixed drug abuse   . Hyperlipidemia   . Hypertension   . Hypothyroidism    [name]   . Lumbar herniated disc    L4-L5   . Microscopic hematuria   . Obesity   . Personal history of venous thrombosis and embolism   . PSVT (paroxysmal supraventricular tachycardia) (HCC) 8/05  . Recurrent cystitis    Dr. Aldean Ast     Past Surgical History:  Procedure Laterality Date  . APPENDECTOMY  2004?or 11/03?  [name] BACK SURGERY    . BLADDER REPAIR    . CARPAL TUNNEL RELEASE     x2   . HARDWARE REMOVAL Right 02/18/2016   Procedure: HARDWARE REMOVAL;  Surgeon: Vickki Hearing, MD;  Location: [facility];  Service: Orthopedics;  Laterality: Right;  right ankle medial malleolar screw  . HARDWARE REMOVAL Right 03/08/2016   Procedure: HARDWARE REMOVAL AND INTERNAL FIXATION OF RIGHT MEDIAL MALLEOLUS  - RIGHT ANKLE REMOVAL OF MALLEOLAR SCREW, INSERTION OF 2  0.45 K WIRES;  Surgeon: Vickki Hearing, MD;  Location: [facility];  Service: Orthopedics;  Laterality: Right;  RIGHT ANKLE REMOVAL OF MALLEOLAR SCREW  . LIVER BIOPSY  2002   Dr. Katrinka Blazing   . ORIF ANKLE FRACTURE Right 02/02/2016   Procedure: OPEN REDUCTION INTERNAL FIXATION (ORIF) ANKLE FRACTURE;  Surgeon: Vickki Hearing, MD;  Location: [facility];  Service: Orthopedics;  Laterality: Right;  . ORIF ANKLE FRACTURE Right 02/18/2016   Procedure: OPEN REDUCTION INTERNAL FIXATION (ORIF) ANKLE FRACTURE;  Surgeon: Vickki Hearing, MD;  Location: [facility];  Service: Orthopedics;  Laterality: Right;  reinsertion of medial malleolar screw  . PARTIAL HYSTERECTOMY  2004   One ovary   . RECTAL PROLAPSE REPAIR    . RECTOCELE REPAIR  05/2001  . Right knee arthroscopy  8/05  . Right knee replacement  8/05  . SHOULDER SURGERY      Current Outpatient Medications  Medication Sig Dispense Refill  . AMITIZA 24 MCG capsule TAKE ONE CAPSULE BY MOUTH TWICE A DAY 60 capsule 5  . aspirin EC 81 MG tablet Take 81 mg by mouth daily.     [name] atorvastatin (LIPITOR) 80 MG tablet TAKE ONE (1) TABLET EACH DAY 90 tablet 1  . cephALEXin (KEFLEX) 500 MG capsule Take 1 capsule (500 mg total) by mouth 4 (four) times daily. 20 capsule 0  . chlorthalidone (HYGROTON) 25 MG tablet TAKE ONE TABLET BY MOUTH TWICE DAILY 60 tablet 5  . clonazePAM (KLONOPIN) 2 MG tablet TAKE 1 TABLET TWICE DAILY AS NEEDED FOR ANXIETY 60 tablet 5  . cyclobenzaprine (FLEXERIL) 10 MG tablet TAKE ONE TABLET BY MOUTH THREE TIMES DAILY AS NEEDED FOR MUSCLE SPASM 90 tablet 5  . diclofenac (VOLTAREN) 75 MG EC tablet  TAKE ONE TABLET BY MOUTH TWICE DAILY 180 tablet 2  . furosemide (LASIX) 20 MG tablet TAKE ONE (1) TABLET EACH DAY 30 tablet 2  . levothyroxine (SYNTHROID, LEVOTHROID) 75 MCG tablet TAKE ONE (1) TABLET EACH DAY 90 tablet 3  . metoprolol succinate (TOPROL-XL) 50 MG 24 hr tablet TAKE ONE TABLET BY MOUTH TWICE DAILY 180 tablet 0  . Omega-3 Fatty Acids (FISH OIL) 1200 MG CAPS Take 1 capsule by mouth 2 (two) times daily.    [name] omeprazole (PRILOSEC) 20 MG capsule TAKE ONE CAPSULE BY MOUTH TWICE A DAY 60 capsule 5  . potassium chloride (K-DUR) 10 MEQ tablet TAKE FIVE TABLETS TWICE DAILY 300 tablet 2  . PREMARIN vaginal cream APPLY ONCE WEEKLY 30 g 3  . RESTASIS 0.05 % ophthalmic emulsion INSTILL ONE DROP IN BOTH EYES TWICE DAILY 60 each 11  . venlafaxine XR (EFFEXOR-XR) 75 MG 24 hr  capsule TAKE 3 CAPSULES EVERY MORNING 90 capsule 1   No current facility-administered medications for this visit.    Allergies:  Clindamycin; Diphenhydramine hcl; and Naproxen   Social History: The patient  reports that she has never smoked. She has never used smokeless tobacco. She reports that she does not drink alcohol or use drugs.   Family History: The patient's family history includes Breast cancer in her mother and sister; Coronary artery disease in her unknown relative; Heart attack in her unknown relative.   ROS:  Please see the history of present illness. Otherwise, complete review of systems is positive for {NONE DEFAULTED:18576::"none"}.  All other systems are reviewed and negative.   Physical Exam: VS:  There were no vitals taken for this visit., BMI There is no height or weight on file to calculate BMI.  Wt Readings from Last 3 Encounters:  11/22/16 215 lb (97.5 kg)  11/18/16 215 lb (97.5 kg)  10/10/16 224 lb 12.8 oz (102 kg)    General: Patient appears comfortable at rest. HEENT: Conjunctiva and lids normal, oropharynx clear with moist mucosa. Neck: Supple, no elevated JVP or carotid bruits, no thyromegaly. Lungs: Clear to auscultation, nonlabored breathing at rest. Cardiac: Regular rate and rhythm, no S3 or significant systolic murmur, no pericardial rub. Abdomen: Soft, nontender, no hepatomegaly, bowel sounds present, no guarding or rebound. Extremities: No pitting edema, distal pulses 2+. Skin: Warm and dry. Musculoskeletal: No kyphosis. Neuropsychiatric: Alert and oriented x3, affect grossly appropriate.  ECG: I personally reviewed the tracing from 11/20/2016 which showed sinus rhythm with right bundle branch block.  Recent Labwork:    Component Value Date/Time   CHOL 326 (H) 07/05/2016 1459   CHOL 166 01/02/2013 1146   TRIG 202 (H) 07/05/2016 1459   TRIG 122 10/26/2014 1655   TRIG 103 01/02/2013 1146   HDL 60 07/05/2016 1459   HDL 72 10/26/2014 1655   HDL  75 01/02/2013 1146   CHOLHDL 5.4 (H) 07/05/2016 1459   LDLCALC 226 (H) 07/05/2016 1459   LDLCALC 57 12/09/2013 1048   LDLCALC 70 01/02/2013 1146    Other Studies Reviewed Today:  Echocardiogram 12/19/2012: Study Conclusions  - Left ventricle: There was mild concentric hypertrophy. Systolic function was normal. The estimated ejection fraction was in the range of 55% to 60%. Left ventricular diastolic function parameters were normal. - Left atrium: The atrium was mildly dilated. - Tricuspid valve: Mild regurgitation.  Assessment and Plan:    Current medicines were reviewed with the patient today.  No orders of the defined types were placed in this encounter.  Disposition:  Signed, Jonelle SidleSamuel G. Russell Engelstad, MD, [facility] 12/07/2017 8:21 AM    [facility] [address], [place], [place] [address] Phone: [phone]; Fax: [phone]

## 2017-12-10 ENCOUNTER — Encounter: Payer: Self-pay | Admitting: Cardiology

## 2017-12-17 DIAGNOSIS — I10 Essential (primary) hypertension: Secondary | ICD-10-CM | POA: Diagnosis not present

## 2017-12-17 DIAGNOSIS — E039 Hypothyroidism, unspecified: Secondary | ICD-10-CM | POA: Diagnosis not present

## 2017-12-17 DIAGNOSIS — E782 Mixed hyperlipidemia: Secondary | ICD-10-CM | POA: Diagnosis not present

## 2017-12-17 DIAGNOSIS — F419 Anxiety disorder, unspecified: Secondary | ICD-10-CM | POA: Diagnosis not present

## 2017-12-21 DIAGNOSIS — E039 Hypothyroidism, unspecified: Secondary | ICD-10-CM | POA: Diagnosis not present

## 2017-12-21 DIAGNOSIS — R7301 Impaired fasting glucose: Secondary | ICD-10-CM | POA: Diagnosis not present

## 2017-12-21 DIAGNOSIS — E782 Mixed hyperlipidemia: Secondary | ICD-10-CM | POA: Diagnosis not present

## 2017-12-21 DIAGNOSIS — F39 Unspecified mood [affective] disorder: Secondary | ICD-10-CM | POA: Diagnosis not present

## 2017-12-21 DIAGNOSIS — F5101 Primary insomnia: Secondary | ICD-10-CM | POA: Diagnosis not present

## 2017-12-21 DIAGNOSIS — Z6837 Body mass index (BMI) 37.0-37.9, adult: Secondary | ICD-10-CM | POA: Diagnosis not present

## 2017-12-21 DIAGNOSIS — N183 Chronic kidney disease, stage 3 (moderate): Secondary | ICD-10-CM | POA: Diagnosis not present

## 2017-12-21 DIAGNOSIS — K588 Other irritable bowel syndrome: Secondary | ICD-10-CM | POA: Diagnosis not present

## 2017-12-21 DIAGNOSIS — M25571 Pain in right ankle and joints of right foot: Secondary | ICD-10-CM | POA: Diagnosis not present

## 2017-12-21 DIAGNOSIS — I10 Essential (primary) hypertension: Secondary | ICD-10-CM | POA: Diagnosis not present

## 2017-12-21 DIAGNOSIS — F419 Anxiety disorder, unspecified: Secondary | ICD-10-CM | POA: Diagnosis not present

## 2017-12-21 DIAGNOSIS — M8580 Other specified disorders of bone density and structure, unspecified site: Secondary | ICD-10-CM | POA: Diagnosis not present

## 2018-01-09 NOTE — Progress Notes (Deleted)
Cardiology Office Note  Date: 01/09/2018   ID: [name], DOB November 30, 1950, MRN [id number]  PCP: Benita Stabile, MD  Primary Cardiologist: Nona Dell, MD   No chief complaint on file.   History of Present Illness: [name] is a 67 y.o. female last seen in May 2018.  Past Medical History:  Diagnosis Date  . Anxiety   . Arthritis   . Borderline hypertension   . Chronic lower back pain   . Chronic shoulder pain   . Depression   . Fe deficiency anemia   . GERD (gastroesophageal reflux disease)   . Heartburn   . Hiatal hernia   . History of cardiac catheterization    Normal coronaries  . History of mixed drug abuse   . Hyperlipidemia   . Hypertension   . Hypothyroidism    [name]   . Lumbar herniated disc    L4-L5   . Microscopic hematuria   . Obesity   . Personal history of venous thrombosis and embolism   . PSVT (paroxysmal supraventricular tachycardia) (HCC) 8/05  . Recurrent cystitis    Dr. Aldean Ast     Past Surgical History:  Procedure Laterality Date  . APPENDECTOMY  2004?or 11/03?  [name] BACK SURGERY    . BLADDER REPAIR    . CARPAL TUNNEL RELEASE     x2   . HARDWARE REMOVAL Right 02/18/2016   Procedure: HARDWARE REMOVAL;  Surgeon: Vickki Hearing, MD;  [facility]: [facility];  Service: Orthopedics;  Laterality: Right;  right ankle medial malleolar screw  . HARDWARE REMOVAL Right 03/08/2016   Procedure: HARDWARE REMOVAL AND INTERNAL FIXATION OF RIGHT MEDIAL MALLEOLUS  - RIGHT ANKLE REMOVAL OF MALLEOLAR SCREW, INSERTION OF 2  0.45 K WIRES;  Surgeon: Vickki Hearing, MD;  Location: [facility];  Service: Orthopedics;  Laterality: Right;  RIGHT ANKLE REMOVAL OF MALLEOLAR SCREW  . LIVER BIOPSY  2002   Dr. Katrinka Blazing   . ORIF ANKLE FRACTURE Right 02/02/2016   Procedure: OPEN REDUCTION INTERNAL FIXATION (ORIF) ANKLE FRACTURE;  Surgeon: Vickki Hearing, MD;  [facility]: [facility];  Service: Orthopedics;  Laterality: Right;  . ORIF ANKLE FRACTURE Right 02/18/2016   Procedure: OPEN REDUCTION INTERNAL FIXATION (ORIF) ANKLE FRACTURE;  Surgeon: Vickki Hearing, MD;  Location: [facility];  Service: Orthopedics;  Laterality: Right;  reinsertion of medial malleolar screw  . PARTIAL HYSTERECTOMY  2004   One ovary   . RECTAL PROLAPSE REPAIR    . RECTOCELE REPAIR  05/2001  . Right knee arthroscopy  8/05  . Right knee replacement  8/05  . SHOULDER SURGERY      Current Outpatient Medications  Medication Sig Dispense Refill  . AMITIZA 24 MCG capsule TAKE ONE CAPSULE BY MOUTH TWICE A DAY 60 capsule 5  . aspirin EC 81 MG tablet Take 81 mg by mouth daily.     [name] atorvastatin (LIPITOR) 80 MG tablet TAKE ONE (1) TABLET EACH DAY 90 tablet 1  . cephALEXin (KEFLEX) 500 MG capsule Take 1 capsule (500 mg total) by mouth 4 (four) times daily. 20 capsule 0  . chlorthalidone (HYGROTON) 25 MG tablet TAKE ONE TABLET BY MOUTH TWICE DAILY 60 tablet 5  . clonazePAM (KLONOPIN) 2 MG tablet TAKE 1 TABLET TWICE DAILY AS NEEDED FOR ANXIETY 60 tablet 5  . cyclobenzaprine (FLEXERIL) 10 MG tablet TAKE ONE TABLET BY MOUTH THREE TIMES DAILY AS NEEDED FOR MUSCLE SPASM 90 tablet 5  . diclofenac (VOLTAREN) 75 MG EC tablet  TAKE ONE TABLET BY MOUTH TWICE DAILY 180 tablet 2  . furosemide (LASIX) 20 MG tablet TAKE ONE (1) TABLET EACH DAY 30 tablet 2  . levothyroxine (SYNTHROID, LEVOTHROID) 75 MCG tablet TAKE ONE (1) TABLET EACH DAY 90 tablet 3  . metoprolol succinate (TOPROL-XL) 50 MG 24 hr tablet TAKE ONE TABLET BY MOUTH TWICE DAILY 180 tablet 0  . Omega-3 Fatty Acids (FISH OIL) 1200 MG CAPS Take 1 capsule by mouth 2 (two) times daily.    [name] omeprazole (PRILOSEC) 20 MG capsule TAKE ONE CAPSULE BY MOUTH TWICE A DAY 60 capsule 5  . potassium chloride (K-DUR) 10 MEQ tablet TAKE FIVE TABLETS TWICE DAILY 300 tablet 2  . PREMARIN vaginal cream APPLY ONCE WEEKLY 30 g 3  . RESTASIS 0.05 % ophthalmic emulsion INSTILL ONE DROP IN BOTH EYES TWICE DAILY 60 each 11  . venlafaxine XR (EFFEXOR-XR) 75 MG 24 hr  capsule TAKE 3 CAPSULES EVERY MORNING 90 capsule 1   No current facility-administered medications for this visit.    Allergies:  Clindamycin; Diphenhydramine hcl; and Naproxen   Social History: The patient  reports that she has never smoked. She has never used smokeless tobacco. She reports that she does not drink alcohol or use drugs.   Family History: The patient's family history includes Breast cancer in her mother and sister; Coronary artery disease in her unknown relative; Heart attack in her unknown relative.   ROS:  Please see the history of present illness. Otherwise, complete review of systems is positive for {NONE DEFAULTED:18576::"none"}.  All other systems are reviewed and negative.   Physical Exam: VS:  There were no vitals taken for this visit., BMI There is no height or weight on file to calculate BMI.  Wt Readings from Last 3 Encounters:  11/22/16 215 lb (97.5 kg)  11/18/16 215 lb (97.5 kg)  10/10/16 224 lb 12.8 oz (102 kg)    General: Patient appears comfortable at rest. HEENT: Conjunctiva and lids normal, oropharynx clear with moist mucosa. Neck: Supple, no elevated JVP or carotid bruits, no thyromegaly. Lungs: Clear to auscultation, nonlabored breathing at rest. Cardiac: Regular rate and rhythm, no S3 or significant systolic murmur, no pericardial rub. Abdomen: Soft, nontender, no hepatomegaly, bowel sounds present, no guarding or rebound. Extremities: No pitting edema, distal pulses 2+. Skin: Warm and dry. Musculoskeletal: No kyphosis. Neuropsychiatric: Alert and oriented x3, affect grossly appropriate.  ECG: I personally reviewed the tracing from 11/18/2016 which showed sinus rhythm with right bundle branch block.  Recent Labwork:    Component Value Date/Time   CHOL 326 (H) 07/05/2016 1459   CHOL 166 01/02/2013 1146   TRIG 202 (H) 07/05/2016 1459   TRIG 122 10/26/2014 1655   TRIG 103 01/02/2013 1146   HDL 60 07/05/2016 1459   HDL 72 10/26/2014 1655   HDL  75 01/02/2013 1146   CHOLHDL 5.4 (H) 07/05/2016 1459   LDLCALC 226 (H) 07/05/2016 1459   LDLCALC 57 12/09/2013 1048   LDLCALC 70 01/02/2013 1146  07/05/2016: ALT 16; AST 16; BUN 22; Creatinine, Ser 0.97; Platelets 281; Potassium 3.4; Sodium 142; TSH 0.782   Other Studies Reviewed Today:  Echocardiogram 12/19/2012: Study Conclusions  - Left ventricle: There was mild concentric hypertrophy. Systolic function was normal. The estimated ejection fraction was in the range of 55% to 60%. Left ventricular diastolic function parameters were normal. - Left atrium: The atrium was mildly dilated. - Tricuspid valve: Mild regurgitation.  Assessment and Plan:    Current medicines were  reviewed with the patient today.  No orders of the defined types were placed in this encounter.   Disposition:  Signed, Jonelle SidleSamuel G. , MD, [facility] 01/09/2018 12:53 PM    [facility] [address], [place], [place] [address] Phone: [phone]) [phone]; Fax: [phone]) [phone]

## 2018-01-10 ENCOUNTER — Ambulatory Visit: Payer: Medicare Other | Admitting: Cardiology

## 2018-01-23 ENCOUNTER — Ambulatory Visit: Payer: Medicare Other | Admitting: Orthopedic Surgery

## 2018-01-30 ENCOUNTER — Ambulatory Visit: Payer: Medicare Other | Admitting: Orthopedic Surgery

## 2018-02-12 ENCOUNTER — Telehealth: Payer: Self-pay | Admitting: Orthopedic Surgery

## 2018-02-12 NOTE — Telephone Encounter (Signed)
[name] called wanting to schedule an appointment again. She has been scheduled 7 different times since April for the same problem. Every time she has either canceled and rescheduled or no showed. I explained to her that since it has been going on for so long and due to her insurance I suggest she see her PCP and be re-evaluated. I also told her that we would need a new referral with notes before we can schedule. Patient stated she understands.

## 2018-02-14 ENCOUNTER — Ambulatory Visit (HOSPITAL_COMMUNITY)
Admission: RE | Admit: 2018-02-14 | Discharge: 2018-02-14 | Disposition: A | Discharge status: Home / Self Care | Payer: Medicare Other | Source: Ambulatory Visit | Attending: Internal Medicine | Admitting: Internal Medicine

## 2018-02-14 DIAGNOSIS — Z1231 Encounter for screening mammogram for malignant neoplasm of breast: Secondary | ICD-10-CM | POA: Diagnosis not present

## 2018-03-11 ENCOUNTER — Encounter: Payer: Self-pay | Admitting: *Deleted

## 2018-03-12 NOTE — Progress Notes (Deleted)
Cardiology Office Note  Date: 03/12/2018   ID: [name], DOB Dec 30, 1950, MRN [id number]  PCP: Benita Stabile, MD  Primary Cardiologist: Nona Dell, MD   No chief complaint on file.   History of Present Illness: [name] is a 67 y.o. female last seen in May 2018.  Past Medical History:  Diagnosis Date  . Anxiety   . Arthritis   . Borderline hypertension   . Chronic lower back pain   . Chronic shoulder pain   . Depression   . Fe deficiency anemia   . GERD (gastroesophageal reflux disease)   . Heartburn   . Hiatal hernia   . History of cardiac catheterization    Normal coronaries  . History of mixed drug abuse   . Hyperlipidemia   . Hypertension   . Hypothyroidism    [name]   . Lumbar herniated disc    L4-L5   . Microscopic hematuria   . Obesity   . Personal history of venous thrombosis and embolism   . PSVT (paroxysmal supraventricular tachycardia) (HCC) 8/05  . Recurrent cystitis    Dr. Aldean Ast     Past Surgical History:  Procedure Laterality Date  . APPENDECTOMY  2004?or 11/03?  [name] BACK SURGERY    . BLADDER REPAIR    . CARPAL TUNNEL RELEASE     x2   . HARDWARE REMOVAL Right 02/18/2016   Procedure: HARDWARE REMOVAL;  Surgeon: Vickki Hearing, MD;  Location: [facility];  Service: Orthopedics;  Laterality: Right;  right ankle medial malleolar screw  . HARDWARE REMOVAL Right 03/08/2016   Procedure: HARDWARE REMOVAL AND INTERNAL FIXATION OF RIGHT MEDIAL MALLEOLUS  - RIGHT ANKLE REMOVAL OF MALLEOLAR SCREW, INSERTION OF 2  0.45 K WIRES;  Surgeon: Vickki Hearing, MD;  Location: [facility];  Service: Orthopedics;  Laterality: Right;  RIGHT ANKLE REMOVAL OF MALLEOLAR SCREW  . LIVER BIOPSY  2002   Dr. Katrinka Blazing   . ORIF ANKLE FRACTURE Right 02/02/2016   Procedure: OPEN REDUCTION INTERNAL FIXATION (ORIF) ANKLE FRACTURE;  Surgeon: Vickki Hearing, MD;  Location: [facility];  Service: Orthopedics;  Laterality: Right;  . ORIF ANKLE FRACTURE Right 02/18/2016   Procedure: OPEN REDUCTION INTERNAL FIXATION (ORIF) ANKLE FRACTURE;  Surgeon: Vickki Hearing, MD;  Location: [facility];  Service: Orthopedics;  Laterality: Right;  reinsertion of medial malleolar screw  . PARTIAL HYSTERECTOMY  2004   One ovary   . RECTAL PROLAPSE REPAIR    . RECTOCELE REPAIR  05/2001  . Right knee arthroscopy  8/05  . Right knee replacement  8/05  . SHOULDER SURGERY      Current Outpatient Medications  Medication Sig Dispense Refill  . AMITIZA 24 MCG capsule TAKE ONE CAPSULE BY MOUTH TWICE A DAY 60 capsule 5  . aspirin EC 81 MG tablet Take 81 mg by mouth daily.     [name] atorvastatin (LIPITOR) 80 MG tablet TAKE ONE (1) TABLET EACH DAY 90 tablet 1  . cephALEXin (KEFLEX) 500 MG capsule Take 1 capsule (500 mg total) by mouth 4 (four) times daily. 20 capsule 0  . chlorthalidone (HYGROTON) 25 MG tablet TAKE ONE TABLET BY MOUTH TWICE DAILY 60 tablet 5  . clonazePAM (KLONOPIN) 2 MG tablet TAKE 1 TABLET TWICE DAILY AS NEEDED FOR ANXIETY 60 tablet 5  . cyclobenzaprine (FLEXERIL) 10 MG tablet TAKE ONE TABLET BY MOUTH THREE TIMES DAILY AS NEEDED FOR MUSCLE SPASM 90 tablet 5  . diclofenac (VOLTAREN) 75 MG EC tablet  TAKE ONE TABLET BY MOUTH TWICE DAILY 180 tablet 2  . furosemide (LASIX) 20 MG tablet TAKE ONE (1) TABLET EACH DAY 30 tablet 2  . levothyroxine (SYNTHROID, LEVOTHROID) 75 MCG tablet TAKE ONE (1) TABLET EACH DAY 90 tablet 3  . metoprolol succinate (TOPROL-XL) 50 MG 24 hr tablet TAKE ONE TABLET BY MOUTH TWICE DAILY 180 tablet 0  . Omega-3 Fatty Acids (FISH OIL) 1200 MG CAPS Take 1 capsule by mouth 2 (two) times daily.    [name] omeprazole (PRILOSEC) 20 MG capsule TAKE ONE CAPSULE BY MOUTH TWICE A DAY 60 capsule 5  . potassium chloride (K-DUR) 10 MEQ tablet TAKE FIVE TABLETS TWICE DAILY 300 tablet 2  . PREMARIN vaginal cream APPLY ONCE WEEKLY 30 g 3  . RESTASIS 0.05 % ophthalmic emulsion INSTILL ONE DROP IN BOTH EYES TWICE DAILY 60 each 11  . venlafaxine XR (EFFEXOR-XR) 75 MG 24 hr  capsule TAKE 3 CAPSULES EVERY MORNING 90 capsule 1   No current facility-administered medications for this visit.    Allergies:  Clindamycin; Diphenhydramine hcl; and Naproxen   Social History: The patient  reports that she has never smoked. She has never used smokeless tobacco. She reports that she does not drink alcohol or use drugs.   Family History: The patient's family history includes Breast cancer in her mother and sister; Coronary artery disease in her unknown relative; Heart attack in her unknown relative.   ROS:  Please see the history of present illness. Otherwise, complete review of systems is positive for {NONE DEFAULTED:18576::"none"}.  All other systems are reviewed and negative.   Physical Exam: VS:  There were no vitals taken for this visit., BMI There is no height or weight on file to calculate BMI.  Wt Readings from Last 3 Encounters:  11/22/16 215 lb (97.5 kg)  11/18/16 215 lb (97.5 kg)  10/10/16 224 lb 12.8 oz (102 kg)    General: Patient appears comfortable at rest. HEENT: Conjunctiva and lids normal, oropharynx clear with moist mucosa. Neck: Supple, no elevated JVP or carotid bruits, no thyromegaly. Lungs: Clear to auscultation, nonlabored breathing at rest. Cardiac: Regular rate and rhythm, no S3 or significant systolic murmur, no pericardial rub. Abdomen: Soft, nontender, no hepatomegaly, bowel sounds present, no guarding or rebound. Extremities: No pitting edema, distal pulses 2+. Skin: Warm and dry. Musculoskeletal: No kyphosis. Neuropsychiatric: Alert and oriented x3, affect grossly appropriate.  ECG: I personally reviewed the tracing from 11/18/2016 which showed sinus rhythm with right bundle branch block.  Recent Labwork:    Component Value Date/Time   CHOL 326 (H) 07/05/2016 1459   CHOL 166 01/02/2013 1146   TRIG 202 (H) 07/05/2016 1459   TRIG 122 10/26/2014 1655   TRIG 103 01/02/2013 1146   HDL 60 07/05/2016 1459   HDL 72 10/26/2014 1655   HDL  75 01/02/2013 1146   CHOLHDL 5.4 (H) 07/05/2016 1459   LDLCALC 226 (H) 07/05/2016 1459   LDLCALC 57 12/09/2013 1048   LDLCALC 70 01/02/2013 1146    Other Studies Reviewed Today:  Echocardiogram 12/19/2012: Study Conclusions  - Left ventricle: There was mild concentric hypertrophy. Systolic function was normal. The estimated ejection fraction was in the range of 55% to 60%. Left ventricular diastolic function parameters were normal. - Left atrium: The atrium was mildly dilated. - Tricuspid valve: Mild regurgitation.  Assessment and Plan:    Current medicines were reviewed with the patient today.  No orders of the defined types were placed in this encounter.  Disposition:  Signed, Jonelle Sidle, MD, [facility] 03/12/2018 1:39 PM    [facility] at [facility] [address] [place], [place], [place] [address] Phone: [phone]; Fax: [phone]

## 2018-03-13 ENCOUNTER — Ambulatory Visit: Payer: Medicare Other | Admitting: Cardiology

## 2018-04-09 DIAGNOSIS — N3001 Acute cystitis with hematuria: Secondary | ICD-10-CM | POA: Diagnosis not present

## 2018-04-09 DIAGNOSIS — R300 Dysuria: Secondary | ICD-10-CM | POA: Diagnosis not present

## 2018-04-10 DIAGNOSIS — Z23 Encounter for immunization: Secondary | ICD-10-CM | POA: Diagnosis not present

## 2018-04-25 DIAGNOSIS — R7301 Impaired fasting glucose: Secondary | ICD-10-CM | POA: Diagnosis not present

## 2018-04-25 DIAGNOSIS — E782 Mixed hyperlipidemia: Secondary | ICD-10-CM | POA: Diagnosis not present

## 2018-04-25 DIAGNOSIS — E039 Hypothyroidism, unspecified: Secondary | ICD-10-CM | POA: Diagnosis not present

## 2018-04-25 DIAGNOSIS — I10 Essential (primary) hypertension: Secondary | ICD-10-CM | POA: Diagnosis not present

## 2018-05-01 ENCOUNTER — Encounter: Payer: Self-pay | Admitting: *Deleted

## 2018-05-01 DIAGNOSIS — Z6838 Body mass index (BMI) 38.0-38.9, adult: Secondary | ICD-10-CM | POA: Diagnosis not present

## 2018-05-01 DIAGNOSIS — F3341 Major depressive disorder, recurrent, in partial remission: Secondary | ICD-10-CM | POA: Diagnosis not present

## 2018-05-01 DIAGNOSIS — R600 Localized edema: Secondary | ICD-10-CM | POA: Diagnosis not present

## 2018-05-01 DIAGNOSIS — F419 Anxiety disorder, unspecified: Secondary | ICD-10-CM | POA: Diagnosis not present

## 2018-05-01 DIAGNOSIS — E782 Mixed hyperlipidemia: Secondary | ICD-10-CM | POA: Diagnosis not present

## 2018-05-01 DIAGNOSIS — I10 Essential (primary) hypertension: Secondary | ICD-10-CM | POA: Diagnosis not present

## 2018-05-01 DIAGNOSIS — N183 Chronic kidney disease, stage 3 (moderate): Secondary | ICD-10-CM | POA: Diagnosis not present

## 2018-05-01 DIAGNOSIS — R350 Frequency of micturition: Secondary | ICD-10-CM | POA: Diagnosis not present

## 2018-05-01 DIAGNOSIS — F5101 Primary insomnia: Secondary | ICD-10-CM | POA: Diagnosis not present

## 2018-05-01 DIAGNOSIS — E039 Hypothyroidism, unspecified: Secondary | ICD-10-CM | POA: Diagnosis not present

## 2018-05-03 ENCOUNTER — Telehealth: Payer: Self-pay | Admitting: Cardiology

## 2018-05-03 ENCOUNTER — Encounter: Payer: Self-pay | Admitting: Cardiology

## 2018-05-03 ENCOUNTER — Ambulatory Visit (INDEPENDENT_AMBULATORY_CARE_PROVIDER_SITE_OTHER): Payer: Medicare Other | Admitting: Cardiology

## 2018-05-03 VITALS — BP 138/84 | HR 81 | Ht 66.0 in | Wt 241.6 lb

## 2018-05-03 DIAGNOSIS — I10 Essential (primary) hypertension: Secondary | ICD-10-CM | POA: Diagnosis not present

## 2018-05-03 DIAGNOSIS — R600 Localized edema: Secondary | ICD-10-CM | POA: Diagnosis not present

## 2018-05-03 DIAGNOSIS — I471 Supraventricular tachycardia: Secondary | ICD-10-CM

## 2018-05-03 NOTE — Patient Instructions (Addendum)

## 2018-05-03 NOTE — Addendum Note (Signed)
Addended by: Eustace MooreANDERSON, Loye Reininger M on: 05/03/2018 03:42 PM   Modules accepted: Orders

## 2018-05-03 NOTE — Progress Notes (Signed)
Cardiology Office Note  Date: 05/03/2018   ID: [name], DOB 1951-04-26, MRN [id number]  PCP: Benita Stabile, MD  Primary Cardiologist: Nona Dell, MD   Chief Complaint  Patient presents with  . Cardiac follow-up    History of Present Illness: [name] is a 67 y.o. female last seen in May 2018.  She presents for a routine visit.  States that she has had no significant palpitations or chest pain.  She has been having some trouble with lower leg and ankle swelling, but states that she has injured both ankles in the recent past, has pending orthopedic consultation.  She has apparently had surgery on her right ankle previously.  I personally reviewed her ECG today which shows normal sinus rhythm with right bundle branch block.  She remains on Toprol-XL 50 mg twice daily.  States that she had a recent visit with PCP and had lab work obtained.  Last echocardiogram was in 2014 at which point LVEF was 55 to 60% range.  Left atrium was mildly dilated.  Past Medical History:  Diagnosis Date  . Anxiety   . Arthritis   . Borderline hypertension   . Chronic lower back pain   . Chronic shoulder pain   . Depression   . Fe deficiency anemia   . GERD (gastroesophageal reflux disease)   . Heartburn   . Hiatal hernia   . History of cardiac catheterization    Normal coronaries  . History of mixed drug abuse (HCC)   . Hyperlipidemia   . Hypertension   . Hypothyroidism    [name]   . Lumbar herniated disc    L4-L5   . Microscopic hematuria   . Obesity   . Personal history of venous thrombosis and embolism   . PSVT (paroxysmal supraventricular tachycardia) (HCC) 8/05  . Recurrent cystitis    Dr. Aldean Ast     Past Surgical History:  Procedure Laterality Date  . APPENDECTOMY  2004?or 11/03?  [name] BACK SURGERY    . BLADDER REPAIR    . CARPAL TUNNEL RELEASE     x2   . HARDWARE REMOVAL Right 02/18/2016   Procedure: HARDWARE REMOVAL;  Surgeon: Vickki Hearing, MD;   Location: [facility];  Service: Orthopedics;  Laterality: Right;  right ankle medial malleolar screw  . HARDWARE REMOVAL Right 03/08/2016   Procedure: HARDWARE REMOVAL AND INTERNAL FIXATION OF RIGHT MEDIAL MALLEOLUS  - RIGHT ANKLE REMOVAL OF MALLEOLAR SCREW, INSERTION OF 2  0.45 K WIRES;  Surgeon: Vickki Hearing, MD;  Location: [facility];  Service: Orthopedics;  Laterality: Right;  RIGHT ANKLE REMOVAL OF MALLEOLAR SCREW  . LIVER BIOPSY  2002   Dr. Katrinka Blazing   . ORIF ANKLE FRACTURE Right 02/02/2016   Procedure: OPEN REDUCTION INTERNAL FIXATION (ORIF) ANKLE FRACTURE;  Surgeon: Vickki Hearing, MD;  Location: [facility];  Service: Orthopedics;  Laterality: Right;  . ORIF ANKLE FRACTURE Right 02/18/2016   Procedure: OPEN REDUCTION INTERNAL FIXATION (ORIF) ANKLE FRACTURE;  Surgeon: Vickki Hearing, MD;  Location: [facility];  Service: Orthopedics;  Laterality: Right;  reinsertion of medial malleolar screw  . PARTIAL HYSTERECTOMY  2004   One ovary   . RECTAL PROLAPSE REPAIR    . RECTOCELE REPAIR  05/2001  . Right knee arthroscopy  8/05  . Right knee replacement  8/05  . SHOULDER SURGERY      Current Outpatient Medications  Medication Sig Dispense Refill  . AMITIZA 24 MCG capsule TAKE ONE CAPSULE  BY MOUTH TWICE A DAY 60 capsule 5  . aspirin EC 81 MG tablet Take 81 mg by mouth daily.     . chlorthalidone (HYGROTON) 25 MG tablet TAKE ONE TABLET BY MOUTH TWICE DAILY 60 tablet 5  . clonazePAM (KLONOPIN) 2 MG tablet TAKE 1 TABLET TWICE DAILY AS NEEDED FOR ANXIETY 60 tablet 5  . cyclobenzaprine (FLEXERIL) 10 MG tablet TAKE ONE TABLET BY MOUTH THREE TIMES DAILY AS NEEDED FOR MUSCLE SPASM 90 tablet 5  . estradiol (ESTRACE) 0.5 MG tablet Take 1 tablet by mouth daily.    . furosemide (LASIX) 20 MG tablet TAKE ONE (1) TABLET EACH DAY 30 tablet 2  . levothyroxine (SYNTHROID, LEVOTHROID) 75 MCG tablet TAKE ONE (1) TABLET EACH DAY 90 tablet 3  . metoprolol succinate (TOPROL-XL) 50 MG 24 hr tablet TAKE ONE TABLET BY MOUTH  TWICE DAILY 180 tablet 0  . Omega-3 Fatty Acids (FISH OIL) 1200 MG CAPS Take 1 capsule by mouth 2 (two) times daily.    [name] omeprazole (PRILOSEC) 20 MG capsule TAKE ONE CAPSULE BY MOUTH TWICE A DAY 60 capsule 5  . potassium chloride (K-DUR) 10 MEQ tablet TAKE FIVE TABLETS TWICE DAILY 300 tablet 2  . RESTASIS 0.05 % ophthalmic emulsion INSTILL ONE DROP IN BOTH EYES TWICE DAILY 60 each 11  . rosuvastatin (CRESTOR) 10 MG tablet Take 1 tablet by mouth daily.    [name] venlafaxine XR (EFFEXOR-XR) 75 MG 24 hr capsule TAKE 3 CAPSULES EVERY MORNING 90 capsule 1   No current facility-administered medications for this visit.    Allergies:  Clindamycin; Diphenhydramine hcl; and Naproxen   Social History: The patient  reports that she has never smoked. She has never used smokeless tobacco. She reports that she does not drink alcohol or use drugs.   ROS:  Please see the history of present illness. Otherwise, complete review of systems is positive for hearing loss.  All other systems are reviewed and negative.   Physical Exam: VS:  BP 138/84   Pulse 81   Ht 5\' 6"  (1.676 m)   Wt 241 lb 9.6 oz (109.6 kg)   SpO2 96%   BMI 39.00 kg/m , BMI Body mass index is 39 kg/m.  Wt Readings from Last 3 Encounters:  05/03/18 241 lb 9.6 oz (109.6 kg)  11/22/16 215 lb (97.5 kg)  11/18/16 215 lb (97.5 kg)    General: Obese woman, appears comfortable at rest. HEENT: Conjunctiva and lids normal, oropharynx clear. Neck: Supple, no elevated JVP or carotid bruits, no thyromegaly. Lungs: Clear to auscultation, nonlabored breathing at rest. Cardiac: Regular rate and rhythm, no S3 or significant systolic murmur. Abdomen: Soft, nontender, bowel sounds present. Extremities: Mild lower leg and ankle edema bilaterally, surgical scars right ankle, distal pulses 1-2+. Skin: Warm and dry. Musculoskeletal: No kyphosis. Neuropsychiatric: Alert and oriented x3, affect grossly appropriate.  ECG: I personally reviewed the tracing  from 11/20/2016 which showed sinus rhythm with right bundle branch block.  Recent Labwork: No results found for requested labs within last 8760 hours.     Component Value Date/Time   CHOL 326 (H) 07/05/2016 1459   CHOL 166 01/02/2013 1146   TRIG 202 (H) 07/05/2016 1459   TRIG 122 10/26/2014 1655   TRIG 103 01/02/2013 1146   HDL 60 07/05/2016 1459   HDL 72 10/26/2014 1655   HDL 75 01/02/2013 1146   CHOLHDL 5.4 (H) 07/05/2016 1459   LDLCALC 226 (H) 07/05/2016 1459   LDLCALC 57 12/09/2013 1048  LDLCALC 70 01/02/2013 1146    Other Studies Reviewed Today:  Echocardiogram 12/19/2012: Study Conclusions  - Left ventricle: There was mild concentric hypertrophy. Systolic function was normal. The estimated ejection fraction was in the range of 55% to 60%. Left ventricular diastolic function parameters were normal. - Left atrium: The atrium was mildly dilated. - Tricuspid valve: Mild regurgitation.  Assessment and Plan:  1.  PSVT, stable without progressive palpitations.  She continues on Toprol-XL, ECG reviewed and stable.  Continue observation.  2.  Lower leg and ankle edema.  Potentially posttraumatic and inflammatory although has persisted.  She is also on low-dose diuretic.  We will plan a follow-up echocardiogram to ensure stability in LVEF.  3.  Essential hypertension by history, blood pressure is adequately controlled today.  She continues to follow with Dr. Margo AyeHall.  Current medicines were reviewed with the patient today.   Orders Placed This Encounter  Procedures  . EKG 12-Lead  . ECHOCARDIOGRAM COMPLETE    Disposition: Follow-up in 1 year.  Signed, Jonelle SidleSamuel G. Karlei Waldo, MD, [facility] HospitalFACC 05/03/2018 3:39 PM    [facility] [address], [place], [place] [address] Phone: [phone]; Fax: [phone]

## 2018-05-03 NOTE — Telephone Encounter (Signed)
Pre-cert Verification for the following procedure   [facility]

## 2018-05-07 ENCOUNTER — Ambulatory Visit (HOSPITAL_COMMUNITY)
Admission: RE | Admit: 2018-05-07 | Discharge: 2018-05-07 | Disposition: A | Discharge status: Home / Self Care | Payer: Medicare Other | Source: Ambulatory Visit | Attending: Cardiology | Admitting: Cardiology

## 2018-05-07 DIAGNOSIS — R600 Localized edema: Secondary | ICD-10-CM | POA: Diagnosis not present

## 2018-05-07 NOTE — Progress Notes (Signed)
*  PRELIMINARY RESULTS* Echocardiogram 2D Echocardiogram has been performed.  Stacey DrainWhite, Jerni Selmer J 05/07/2018, 1:39 PM

## 2018-05-08 ENCOUNTER — Telehealth: Payer: Self-pay | Admitting: *Deleted

## 2018-05-08 NOTE — Telephone Encounter (Signed)
Patient informed. Copy sent to PCP °

## 2018-05-08 NOTE — Telephone Encounter (Signed)
-----   Message from Jonelle SidleSamuel G McDowell, MD sent at 05/07/2018  3:19 PM EST ----- Results reviewed.  LVEF remains normal at 55 to 60%.  No significant valvular abnormalities.  Continue with current follow-up plan. A copy of this test should be forwarded to Benita StabileHall, John Z, MD.

## 2018-06-19 ENCOUNTER — Ambulatory Visit: Payer: Medicare Other | Admitting: Orthopedic Surgery

## 2018-06-26 ENCOUNTER — Encounter: Payer: Self-pay | Admitting: Orthopedic Surgery

## 2018-08-15 DIAGNOSIS — N390 Urinary tract infection, site not specified: Secondary | ICD-10-CM | POA: Diagnosis not present

## 2018-08-15 DIAGNOSIS — F3341 Major depressive disorder, recurrent, in partial remission: Secondary | ICD-10-CM | POA: Diagnosis not present

## 2018-08-15 DIAGNOSIS — R600 Localized edema: Secondary | ICD-10-CM | POA: Diagnosis not present

## 2018-08-15 DIAGNOSIS — R350 Frequency of micturition: Secondary | ICD-10-CM | POA: Diagnosis not present

## 2018-08-15 DIAGNOSIS — F39 Unspecified mood [affective] disorder: Secondary | ICD-10-CM | POA: Diagnosis not present

## 2018-08-15 DIAGNOSIS — Z6838 Body mass index (BMI) 38.0-38.9, adult: Secondary | ICD-10-CM | POA: Diagnosis not present

## 2018-08-15 DIAGNOSIS — N183 Chronic kidney disease, stage 3 (moderate): Secondary | ICD-10-CM | POA: Diagnosis not present

## 2018-08-15 DIAGNOSIS — M8580 Other specified disorders of bone density and structure, unspecified site: Secondary | ICD-10-CM | POA: Diagnosis not present

## 2018-08-23 DIAGNOSIS — F3341 Major depressive disorder, recurrent, in partial remission: Secondary | ICD-10-CM | POA: Diagnosis not present

## 2018-08-23 DIAGNOSIS — E039 Hypothyroidism, unspecified: Secondary | ICD-10-CM | POA: Diagnosis not present

## 2018-08-23 DIAGNOSIS — E782 Mixed hyperlipidemia: Secondary | ICD-10-CM | POA: Diagnosis not present

## 2018-08-23 DIAGNOSIS — I10 Essential (primary) hypertension: Secondary | ICD-10-CM | POA: Diagnosis not present

## 2018-08-23 DIAGNOSIS — F5101 Primary insomnia: Secondary | ICD-10-CM | POA: Diagnosis not present

## 2018-08-23 DIAGNOSIS — N183 Chronic kidney disease, stage 3 (moderate): Secondary | ICD-10-CM | POA: Diagnosis not present

## 2018-08-23 DIAGNOSIS — R350 Frequency of micturition: Secondary | ICD-10-CM | POA: Diagnosis not present

## 2018-08-23 DIAGNOSIS — F39 Unspecified mood [affective] disorder: Secondary | ICD-10-CM | POA: Diagnosis not present

## 2018-08-23 DIAGNOSIS — Z6838 Body mass index (BMI) 38.0-38.9, adult: Secondary | ICD-10-CM | POA: Diagnosis not present

## 2018-08-23 DIAGNOSIS — M8580 Other specified disorders of bone density and structure, unspecified site: Secondary | ICD-10-CM | POA: Diagnosis not present

## 2018-08-23 DIAGNOSIS — R600 Localized edema: Secondary | ICD-10-CM | POA: Diagnosis not present

## 2018-08-23 DIAGNOSIS — R232 Flushing: Secondary | ICD-10-CM | POA: Diagnosis not present

## 2018-10-08 DIAGNOSIS — N390 Urinary tract infection, site not specified: Secondary | ICD-10-CM | POA: Diagnosis not present

## 2018-10-08 DIAGNOSIS — R300 Dysuria: Secondary | ICD-10-CM | POA: Diagnosis not present

## 2018-10-08 DIAGNOSIS — R319 Hematuria, unspecified: Secondary | ICD-10-CM | POA: Diagnosis not present

## 2019-03-03 ENCOUNTER — Other Ambulatory Visit (HOSPITAL_COMMUNITY): Payer: Self-pay | Admitting: Internal Medicine

## 2019-03-03 DIAGNOSIS — N183 Chronic kidney disease, stage 3 (moderate): Secondary | ICD-10-CM | POA: Diagnosis not present

## 2019-03-03 DIAGNOSIS — Z1231 Encounter for screening mammogram for malignant neoplasm of breast: Secondary | ICD-10-CM

## 2019-03-07 ENCOUNTER — Inpatient Hospital Stay (HOSPITAL_COMMUNITY): Admission: RE | Admit: 2019-03-07 | Payer: Medicare Other | Source: Ambulatory Visit

## 2019-03-12 ENCOUNTER — Ambulatory Visit (HOSPITAL_COMMUNITY): Payer: Medicare Other

## 2019-03-13 ENCOUNTER — Ambulatory Visit (HOSPITAL_COMMUNITY): Payer: Medicare Other

## 2019-03-19 DIAGNOSIS — E782 Mixed hyperlipidemia: Secondary | ICD-10-CM | POA: Diagnosis not present

## 2019-03-19 DIAGNOSIS — R232 Flushing: Secondary | ICD-10-CM | POA: Diagnosis not present

## 2019-03-19 DIAGNOSIS — N951 Menopausal and female climacteric states: Secondary | ICD-10-CM | POA: Diagnosis not present

## 2019-03-19 DIAGNOSIS — F419 Anxiety disorder, unspecified: Secondary | ICD-10-CM | POA: Diagnosis not present

## 2019-03-19 DIAGNOSIS — F5101 Primary insomnia: Secondary | ICD-10-CM | POA: Diagnosis not present

## 2019-03-19 DIAGNOSIS — E039 Hypothyroidism, unspecified: Secondary | ICD-10-CM | POA: Diagnosis not present

## 2019-03-19 DIAGNOSIS — N390 Urinary tract infection, site not specified: Secondary | ICD-10-CM | POA: Diagnosis not present

## 2019-03-19 DIAGNOSIS — R350 Frequency of micturition: Secondary | ICD-10-CM | POA: Diagnosis not present

## 2019-03-19 DIAGNOSIS — F39 Unspecified mood [affective] disorder: Secondary | ICD-10-CM | POA: Diagnosis not present

## 2019-03-19 DIAGNOSIS — Z6838 Body mass index (BMI) 38.0-38.9, adult: Secondary | ICD-10-CM | POA: Diagnosis not present

## 2019-03-19 DIAGNOSIS — I129 Hypertensive chronic kidney disease with stage 1 through stage 4 chronic kidney disease, or unspecified chronic kidney disease: Secondary | ICD-10-CM | POA: Diagnosis not present

## 2019-03-19 DIAGNOSIS — M8580 Other specified disorders of bone density and structure, unspecified site: Secondary | ICD-10-CM | POA: Diagnosis not present

## 2019-03-19 DIAGNOSIS — N183 Chronic kidney disease, stage 3 unspecified: Secondary | ICD-10-CM | POA: Diagnosis not present

## 2019-03-19 DIAGNOSIS — Z23 Encounter for immunization: Secondary | ICD-10-CM | POA: Diagnosis not present

## 2019-03-19 DIAGNOSIS — F3341 Major depressive disorder, recurrent, in partial remission: Secondary | ICD-10-CM | POA: Diagnosis not present

## 2019-03-19 DIAGNOSIS — R600 Localized edema: Secondary | ICD-10-CM | POA: Diagnosis not present

## 2019-03-20 ENCOUNTER — Other Ambulatory Visit (HOSPITAL_COMMUNITY): Payer: Self-pay | Admitting: Internal Medicine

## 2019-03-20 DIAGNOSIS — Z13820 Encounter for screening for osteoporosis: Secondary | ICD-10-CM

## 2019-03-27 ENCOUNTER — Inpatient Hospital Stay (HOSPITAL_COMMUNITY): Admission: RE | Admit: 2019-03-27 | Payer: Medicare Other | Source: Ambulatory Visit

## 2019-03-27 ENCOUNTER — Other Ambulatory Visit (HOSPITAL_COMMUNITY): Payer: Medicare Other

## 2019-04-09 ENCOUNTER — Other Ambulatory Visit (HOSPITAL_COMMUNITY): Payer: Medicare Other

## 2019-04-09 ENCOUNTER — Other Ambulatory Visit: Payer: Self-pay

## 2019-04-09 ENCOUNTER — Ambulatory Visit (HOSPITAL_COMMUNITY)
Admission: RE | Admit: 2019-04-09 | Discharge: 2019-04-09 | Disposition: A | Discharge status: Home / Self Care | Payer: Medicare Other | Source: Ambulatory Visit | Attending: Internal Medicine | Admitting: Internal Medicine

## 2019-04-09 DIAGNOSIS — Z1231 Encounter for screening mammogram for malignant neoplasm of breast: Secondary | ICD-10-CM | POA: Diagnosis not present

## 2019-04-09 DIAGNOSIS — M85851 Other specified disorders of bone density and structure, right thigh: Secondary | ICD-10-CM | POA: Diagnosis not present

## 2019-04-09 DIAGNOSIS — Z13820 Encounter for screening for osteoporosis: Secondary | ICD-10-CM

## 2019-05-15 DIAGNOSIS — N951 Menopausal and female climacteric states: Secondary | ICD-10-CM | POA: Diagnosis not present

## 2019-05-15 DIAGNOSIS — R600 Localized edema: Secondary | ICD-10-CM | POA: Diagnosis not present

## 2019-05-15 DIAGNOSIS — N183 Chronic kidney disease, stage 3 unspecified: Secondary | ICD-10-CM | POA: Diagnosis not present

## 2019-05-15 DIAGNOSIS — F39 Unspecified mood [affective] disorder: Secondary | ICD-10-CM | POA: Diagnosis not present

## 2019-05-15 DIAGNOSIS — R232 Flushing: Secondary | ICD-10-CM | POA: Diagnosis not present

## 2019-05-15 DIAGNOSIS — M8580 Other specified disorders of bone density and structure, unspecified site: Secondary | ICD-10-CM | POA: Diagnosis not present

## 2019-05-15 DIAGNOSIS — N3000 Acute cystitis without hematuria: Secondary | ICD-10-CM | POA: Diagnosis not present

## 2019-05-15 DIAGNOSIS — I129 Hypertensive chronic kidney disease with stage 1 through stage 4 chronic kidney disease, or unspecified chronic kidney disease: Secondary | ICD-10-CM | POA: Diagnosis not present

## 2019-05-15 DIAGNOSIS — N390 Urinary tract infection, site not specified: Secondary | ICD-10-CM | POA: Diagnosis not present

## 2019-05-15 DIAGNOSIS — Z6838 Body mass index (BMI) 38.0-38.9, adult: Secondary | ICD-10-CM | POA: Diagnosis not present

## 2019-05-15 DIAGNOSIS — R350 Frequency of micturition: Secondary | ICD-10-CM | POA: Diagnosis not present

## 2019-05-19 ENCOUNTER — Encounter: Payer: Self-pay | Admitting: *Deleted

## 2019-05-20 ENCOUNTER — Encounter: Payer: Self-pay | Admitting: Cardiology

## 2019-05-20 ENCOUNTER — Encounter: Payer: Self-pay | Admitting: *Deleted

## 2019-05-20 ENCOUNTER — Encounter: Payer: Medicare Other | Admitting: Cardiology

## 2019-05-20 NOTE — Progress Notes (Signed)
error 

## 2019-05-21 NOTE — Progress Notes (Signed)
This encounter was created in error - please disregard.

## 2019-06-26 ENCOUNTER — Encounter: Payer: Self-pay | Admitting: Family Medicine

## 2019-06-26 ENCOUNTER — Telehealth (INDEPENDENT_AMBULATORY_CARE_PROVIDER_SITE_OTHER): Payer: Medicare Other | Admitting: Family Medicine

## 2019-06-26 VITALS — Ht 66.0 in | Wt 235.0 lb

## 2019-06-26 DIAGNOSIS — I10 Essential (primary) hypertension: Secondary | ICD-10-CM | POA: Diagnosis not present

## 2019-06-26 DIAGNOSIS — I471 Supraventricular tachycardia: Secondary | ICD-10-CM

## 2019-06-26 DIAGNOSIS — R600 Localized edema: Secondary | ICD-10-CM

## 2019-06-26 DIAGNOSIS — E782 Mixed hyperlipidemia: Secondary | ICD-10-CM

## 2019-06-26 NOTE — Progress Notes (Signed)
Virtual Visit via Telephone Note   This visit type was conducted due to national recommendations for restrictions regarding the COVID-19 Pandemic (e.g. social distancing) in an effort to limit this patient's exposure and mitigate transmission in our community.  Due to her co-morbid illnesses, this patient is at least at moderate risk for complications without adequate follow up.  This format is felt to be most appropriate for this patient at this time.  The patient did not have access to video technology/had technical difficulties with video requiring transitioning to audio format only (telephone).  All issues noted in this document were discussed and addressed.  No physical exam could be performed with this format.  Please refer to the patient's chart for her  consent to telehealth for [facility].   Date:  06/26/2019   ID:  [name], DOB 1950-07-27, MRN [id number]  Patient Location: Home Provider Location: Office  PCP:  Celene Squibb, MD  Cardiologist:  Rozann Lesches, MD  Electrophysiologist:  None   Evaluation Performed:  Follow-Up Visit  Chief Complaint: Follow-up  History of Present Illness:    [name] is a 69 y.o. female last seen by Dr. Domenic Polite May 03, 2018 for follow-up.  At that visit she had not complained of any significant palpitations or chest pain.  She had been experiencing some trouble with lower leg and ankle swelling but has had previous injuries to both ankles in the past.  She was pending orthopedic consultation.  She has had previous surgery on ankle in the past.  She was continuing her Toprol-XL 50 mg twice a day for prevention of palpitations at that time.  As of today's visit she denies any palpitations since last visit.  States she continues to have some lower extremity edema in spite of taking Lasix 20 mg daily.  She is no longer on chlorthalidone.  Most recent echocardiogram on May 07, 2018 was unchanged from previous echo in 2014.   LVEF 55-60.  No atrial dilatation noted as on previous echo.  Cardiac medication includes;  aspirin 81 mg, furosemide 20 mg daily Toprol-XL 50 mg twice daily, Crestor 10 mg daily, Vascepa 1 g capsule 2 g by mouth twice daily.  She admits to some mild dyspnea on exertion which resolves after resting.  No other anginal-like symptoms such as chest, neck, arm, back, or jaw pain on exertion.  No associated nausea, vomiting, or with sweating during exertion.  She is not very active on a daily basis.  The patient does not have symptoms concerning for COVID-19 infection (fever, chills, cough, or new shortness of breath).    Past Medical History:  Diagnosis Date  . Anxiety   . Arthritis   . Borderline hypertension   . Chronic lower back pain   . Chronic shoulder pain   . Depression   . Fe deficiency anemia   . GERD (gastroesophageal reflux disease)   . Heartburn   . Hiatal hernia   . History of cardiac catheterization    Normal coronaries  . History of mixed drug abuse ([place])   . Hyperlipidemia   . Hypertension   . Hypothyroidism    Dr. Derrill Memo   . Lumbar herniated disc    L4-L5   . Microscopic hematuria   . Obesity   . Personal history of venous thrombosis and embolism   . PSVT (paroxysmal supraventricular tachycardia) ([place]) 8/05  . Recurrent cystitis    Dr. Serita Butcher    Past Surgical History:  Procedure Laterality Date  .  APPENDECTOMY  2004?or 11/03?  [name] BACK SURGERY    . BLADDER REPAIR    . CARPAL TUNNEL RELEASE     x2   . HARDWARE REMOVAL Right 02/18/2016   Procedure: HARDWARE REMOVAL;  Surgeon: Vickki Hearing, MD;  Location: [facility];  Service: Orthopedics;  Laterality: Right;  right ankle medial malleolar screw  . HARDWARE REMOVAL Right 03/08/2016   Procedure: HARDWARE REMOVAL AND INTERNAL FIXATION OF RIGHT MEDIAL MALLEOLUS  - RIGHT ANKLE REMOVAL OF MALLEOLAR SCREW, INSERTION OF 2  0.45 K WIRES;  Surgeon: Vickki Hearing, MD;  Location: [facility];  Service: Orthopedics;   Laterality: Right;  RIGHT ANKLE REMOVAL OF MALLEOLAR SCREW  . LIVER BIOPSY  2002   Dr. Katrinka Blazing   . ORIF ANKLE FRACTURE Right 02/02/2016   Procedure: OPEN REDUCTION INTERNAL FIXATION (ORIF) ANKLE FRACTURE;  Surgeon: Vickki Hearing, MD;  Location: [facility];  Service: Orthopedics;  Laterality: Right;  . ORIF ANKLE FRACTURE Right 02/18/2016   Procedure: OPEN REDUCTION INTERNAL FIXATION (ORIF) ANKLE FRACTURE;  Surgeon: Vickki Hearing, MD;  Location: [facility];  Service: Orthopedics;  Laterality: Right;  reinsertion of medial malleolar screw  . PARTIAL HYSTERECTOMY  2004   One ovary   . RECTAL PROLAPSE REPAIR    . RECTOCELE REPAIR  05/2001  . Right knee arthroscopy  8/05  . Right knee replacement  8/05  . SHOULDER SURGERY       Current Meds  Medication Sig  . AMITIZA 24 MCG capsule TAKE ONE CAPSULE BY MOUTH TWICE A DAY  . aspirin EC 81 MG tablet Take 81 mg by mouth daily.   . chlorthalidone (HYGROTON) 25 MG tablet TAKE ONE TABLET BY MOUTH TWICE DAILY  . clonazePAM (KLONOPIN) 1 MG tablet Take 1 mg by mouth 3 (three) times daily as needed.  . cyclobenzaprine (FLEXERIL) 10 MG tablet TAKE ONE TABLET BY MOUTH THREE TIMES DAILY AS NEEDED FOR MUSCLE SPASM  . escitalopram (LEXAPRO) 10 MG tablet Take 10 mg by mouth daily.  [name] estradiol (ESTRACE) 0.5 MG tablet Take 1 tablet by mouth daily.  [name] ezetimibe (ZETIA) 10 MG tablet Take 10 mg by mouth daily.  . furosemide (LASIX) 20 MG tablet TAKE ONE (1) TABLET EACH DAY  . levothyroxine (SYNTHROID, LEVOTHROID) 75 MCG tablet TAKE ONE (1) TABLET EACH DAY  . metoprolol succinate (TOPROL-XL) 50 MG 24 hr tablet TAKE ONE TABLET BY MOUTH TWICE DAILY  . Omega-3 Fatty Acids (FISH OIL) 1200 MG CAPS Take 1 capsule by mouth 2 (two) times daily.  [name] omeprazole (PRILOSEC) 20 MG capsule TAKE ONE CAPSULE BY MOUTH TWICE A DAY  . potassium chloride (K-DUR) 10 MEQ tablet TAKE FIVE TABLETS TWICE DAILY  . RESTASIS 0.05 % ophthalmic emulsion INSTILL ONE DROP IN BOTH EYES TWICE DAILY    . rosuvastatin (CRESTOR) 10 MG tablet Take 1 tablet by mouth daily.  . traZODone (DESYREL) 50 MG tablet Take 50 mg by mouth at bedtime.  [name] VASCEPA 1 g capsule Take 2 g by mouth 2 (two) times daily.  [name] venlafaxine XR (EFFEXOR-XR) 75 MG 24 hr capsule TAKE 3 CAPSULES EVERY MORNING     Allergies:   Clindamycin, Diphenhydramine hcl, and Naproxen   Social History   Tobacco Use  . Smoking status: Never Smoker  . Smokeless tobacco: Never Used  Substance Use Topics  . Alcohol use: No    Alcohol/week: 0.0 standard drinks  . Drug use: No    Comment: history of abuse  Family Hx: The patient's family history includes Breast cancer in her mother and sister; Coronary artery disease in an other family member; Heart attack in an other family member.  ROS:   Please see the history of present illness.    All other systems reviewed and are negative.   Prior CV studies:   The following studies were reviewed today:  --Echocardiogram May 07, 2018 Study Conclusions  - Left ventricle: The cavity size was normal. Wall thickness was   normal. Systolic function was normal. The estimated ejection   fraction was in the range of 55% to 60%. Wall motion was normal;   there were no regional wall motion abnormalities. Left   ventricular diastolic function parameters were normal. - Atrial septum: No defect or patent foramen ovale was identified.  Echocardiogram 12/19/2012: Study Conclusions  - Left ventricle: There was mild concentric hypertrophy. Systolic function was normal. The estimated ejection fraction was in the range of 55% to 60%. Left ventricular diastolic function parameters were normal. - Left atrium: The atrium was mildly dilated. - Tricuspid valve: Mild regurgitation.   Labs/Other Tests and Data Reviewed:    EKG:  An ECG dated May 03, 2018 was personally reviewed today and demonstrated:  Sinus rhythm 87 right bundle branch block  Recent Labs: March 03, 2019 labs by PCP showed a hemoglobin of 13.8, hematocrit 41.7, platelets 216, glucose 102, BUN 14, creatinine 1, GFR 58, sodium 143, potassium 4.5, AST 21, ALT 20, total cholesterol 178, triglycerides 175, HDL 62, VLDL 30, LDL 86  Recent Lipid Panel Lab Results  Component Value Date/Time   CHOL 326 (H) 07/05/2016 02:59 PM   CHOL 166 01/02/2013 11:46 AM   TRIG 202 (H) 07/05/2016 02:59 PM   TRIG 122 10/26/2014 04:55 PM   TRIG 103 01/02/2013 11:46 AM   HDL 60 07/05/2016 02:59 PM   HDL 72 10/26/2014 04:55 PM   HDL 75 01/02/2013 11:46 AM   CHOLHDL 5.4 (H) 07/05/2016 02:59 PM   LDLCALC 226 (H) 07/05/2016 02:59 PM   LDLCALC 57 12/09/2013 10:48 AM   LDLCALC 70 01/02/2013 11:46 AM    Wt Readings from Last 3 Encounters:  06/26/19 235 lb (106.6 kg)  05/03/18 241 lb 9.6 oz (109.6 kg)  11/22/16 215 lb (97.5 kg)     Objective:    Vital Signs:  Ht 5\' 6"  (1.676 m)   Wt 235 lb (106.6 kg)   BMI 37.93 kg/m    Has normal speech pattern.  No evidence of dyspnea, wheezing, or cough noted.  ASSESSMENT & PLAN:    1. PSVT (paroxysmal supraventricular tachycardia) (HCC) She denies any recent episodes of palpitations, arrhythmias, fluttering sensations, or skipped heartbeats.  Continue metoprolol 50 mg daily.  2. Essential hypertension She does not have a recent recorded blood pressure.  She states at her primary care provider's office her blood pressure is usually within normal limits.  3. Lower extremity edema She continues with complaints of bilateral lower extremity edema.  States the Lasix does not help very much with swelling.  Advised patient she could take an extra dose of Lasix 20 mg 2-3 times a week as needed for swelling if needed.  Patient states she is going to see a podiatrist for ankle/foot swelling in the near future.  4.  Hyperlipidemia Recent lipid panel in September showed total cholesterol 178, triglycerides 175, HDL 62, VLDL 30, LDL 86.  Continue Crestor 10 mg  daily.  COVID-19 Education: The signs and symptoms of COVID-19 were  discussed with the patient and how to seek care for testing (follow up with PCP or arrange E-visit).  The importance of social distancing was discussed today.  Time:   Today, I have spent 15 minutes with the patient with telehealth technology discussing the above problems.     Medication Adjustments/Labs and Tests Ordered: Current medicines are reviewed at length with the patient today.  Concerns regarding medicines are outlined above.   Tests Ordered: No orders of the defined types were placed in this encounter.   Medication Changes: No orders of the defined types were placed in this encounter.   Follow Up:  Either In Person or Virtual in 1 year(s)  Signed, Netta Neat, NP  06/26/2019 11:31 AM    [facility]

## 2019-06-26 NOTE — Patient Instructions (Addendum)
Medication Instructions:   Your physician recommends that you continue on your current medications as directed. Please refer to the Current Medication list given to you today.  Labwork:  NONE  Testing/Procedures:  NONE  Follow-Up:  Your physician recommends that you schedule a follow-up appointment in: 1 year (office or virtual). You will receive a reminder letter in the mail in about 10 months reminding you to call and schedule your appointment. If you don't receive this letter, please contact our office.  Any Other Special Instructions Will Be Listed Below (If Applicable).  If you need a refill on your cardiac medications before your next appointment, please call your pharmacy. 

## 2019-07-04 IMAGING — MG DIGITAL SCREENING BILATERAL MAMMOGRAM WITH TOMO AND CAD
8 series · 8 of 24 positions shown · non-contrast
Comparison: Previous exam(s).

CLINICAL DATA: Screening.

EXAM:
DIGITAL SCREENING BILATERAL MAMMOGRAM WITH TOMO AND CAD

[R CC synth-2D]
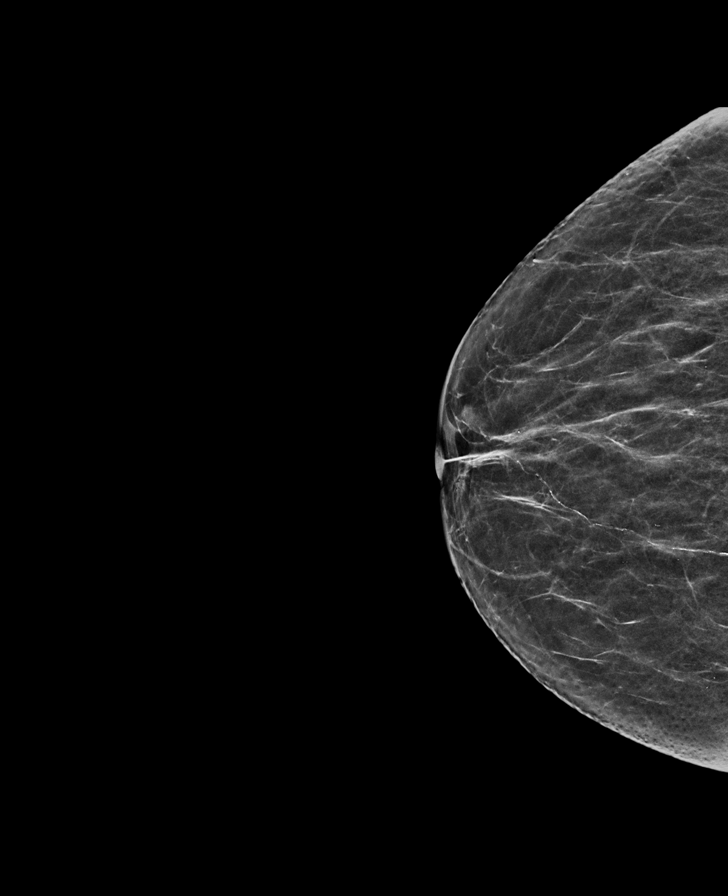

[R MLO synth-2D]
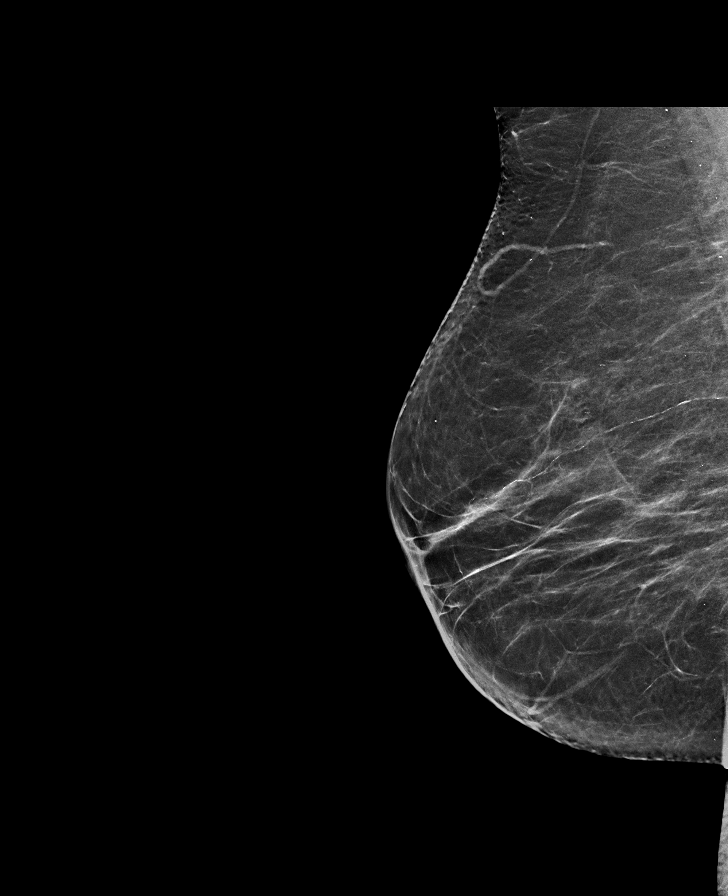

[L CC synth-2D]
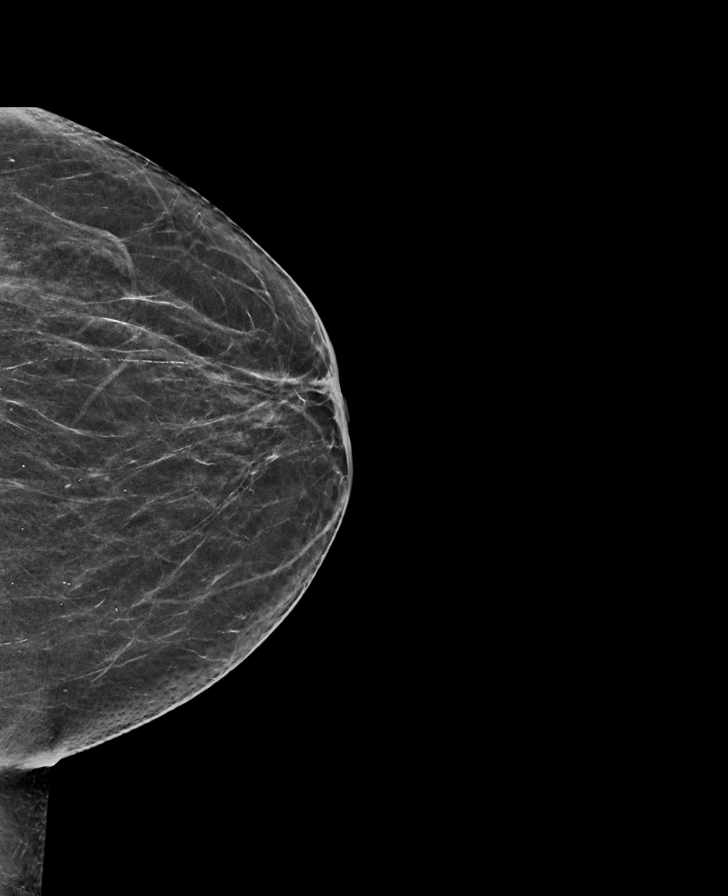

[L MLO synth-2D]
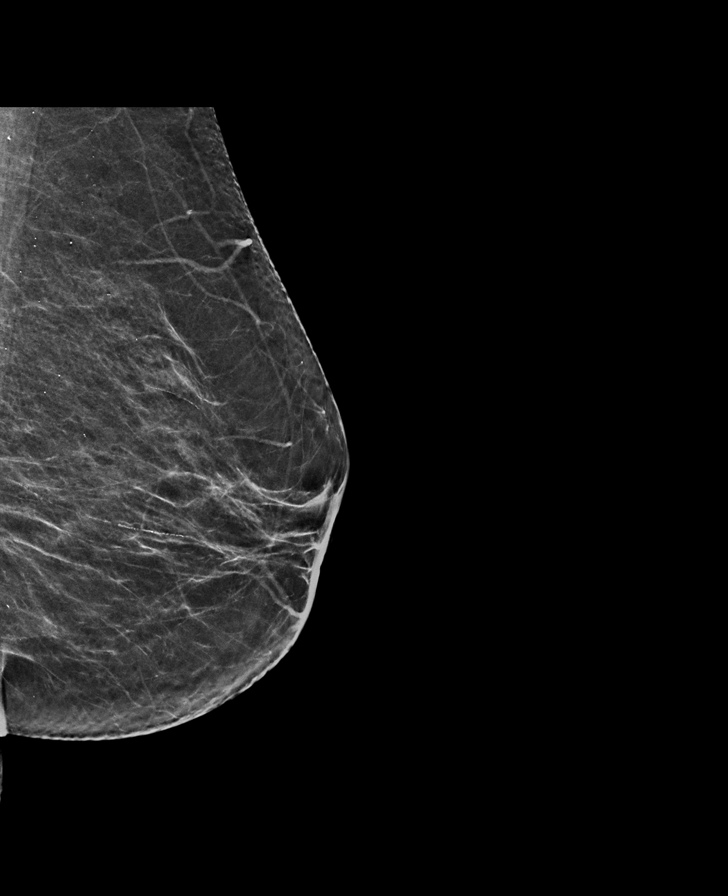

[L CC tomo · tomo slice 30/59.0]
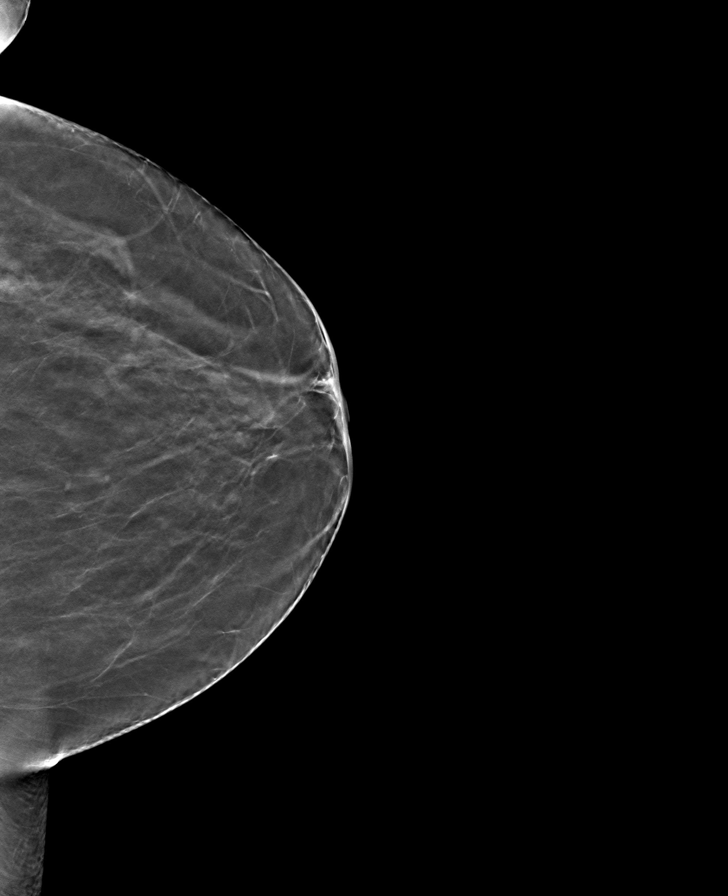

[R MLO tomo · tomo slice 33/64.0]
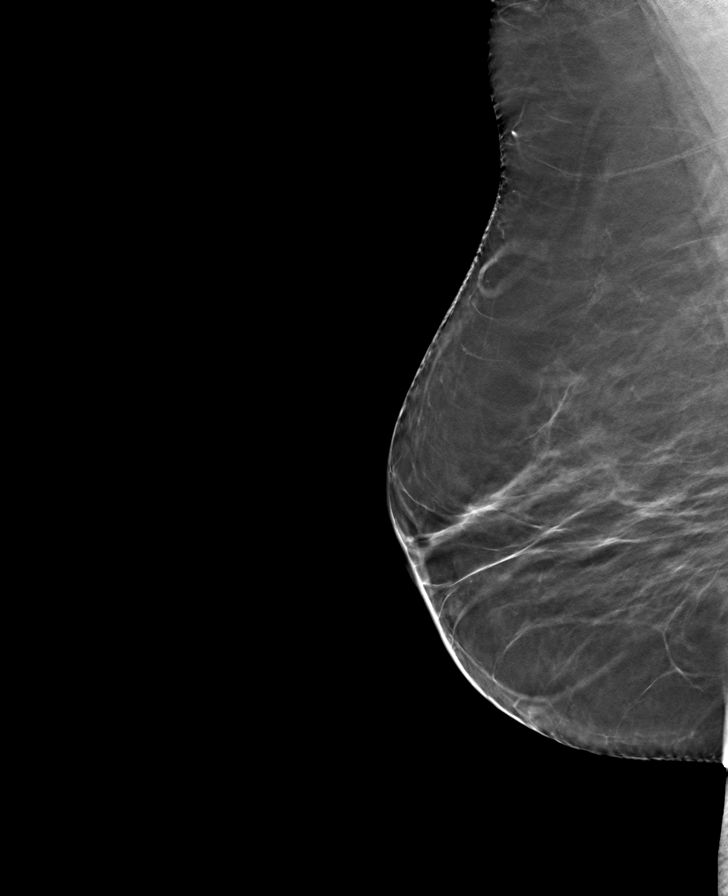

[L MLO tomo · tomo slice 30/59.0]
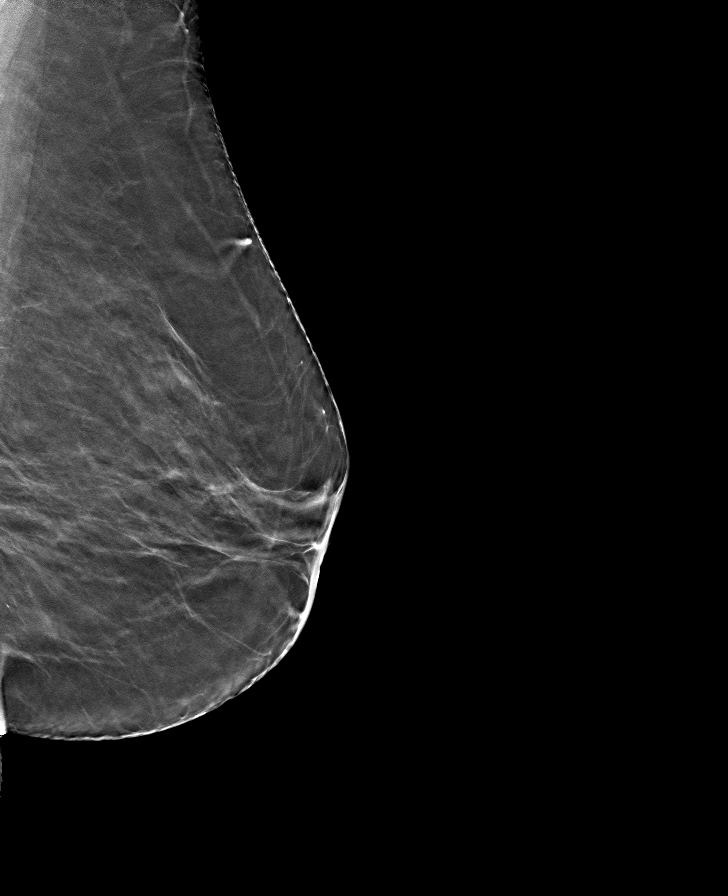

[R CC tomo · tomo slice 29/58.0]
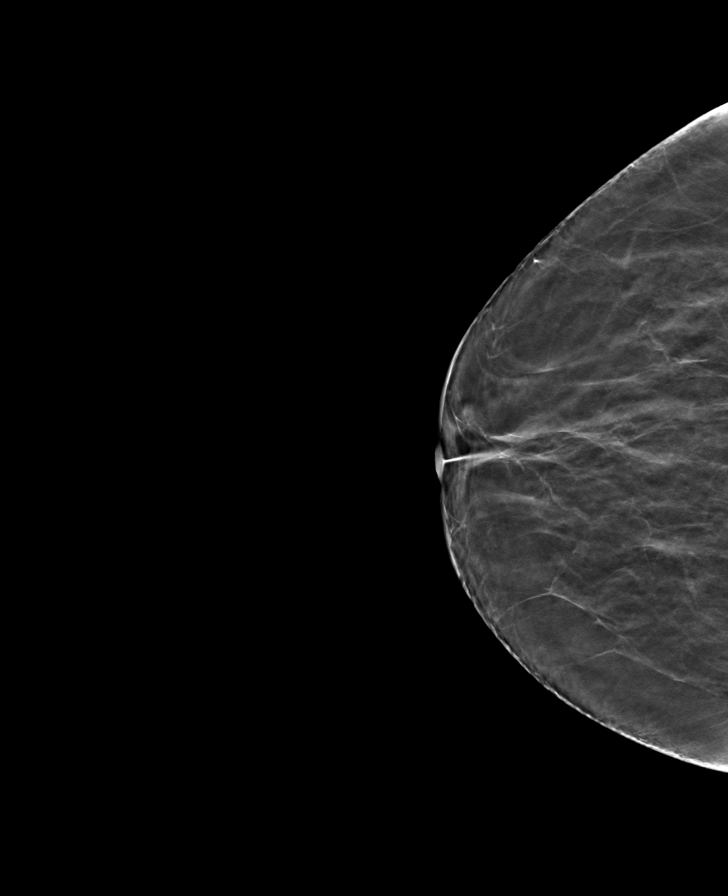

[8 of 24 positions shown; findings below may reference images not displayed]

ACR Breast Density Category b: There are scattered areas of
fibroglandular density.
FINDINGS: There are no findings suspicious for malignancy. Images were
processed with CAD.
IMPRESSION: No mammographic evidence of malignancy. A result letter of this
screening mammogram will be mailed directly to the patient.

RECOMMENDATION:
Screening mammogram in one year. (Code:[id number])

BI-RADS CATEGORY  1: Negative.

## 2019-08-29 DIAGNOSIS — F3341 Major depressive disorder, recurrent, in partial remission: Secondary | ICD-10-CM | POA: Diagnosis not present

## 2019-08-29 DIAGNOSIS — F39 Unspecified mood [affective] disorder: Secondary | ICD-10-CM | POA: Diagnosis not present

## 2019-08-29 DIAGNOSIS — R232 Flushing: Secondary | ICD-10-CM | POA: Diagnosis not present

## 2019-08-29 DIAGNOSIS — N390 Urinary tract infection, site not specified: Secondary | ICD-10-CM | POA: Diagnosis not present

## 2019-08-29 DIAGNOSIS — I129 Hypertensive chronic kidney disease with stage 1 through stage 4 chronic kidney disease, or unspecified chronic kidney disease: Secondary | ICD-10-CM | POA: Diagnosis not present

## 2019-08-29 DIAGNOSIS — N951 Menopausal and female climacteric states: Secondary | ICD-10-CM | POA: Diagnosis not present

## 2019-08-29 DIAGNOSIS — Z6838 Body mass index (BMI) 38.0-38.9, adult: Secondary | ICD-10-CM | POA: Diagnosis not present

## 2019-08-29 DIAGNOSIS — N3000 Acute cystitis without hematuria: Secondary | ICD-10-CM | POA: Diagnosis not present

## 2019-08-29 DIAGNOSIS — R7301 Impaired fasting glucose: Secondary | ICD-10-CM | POA: Diagnosis not present

## 2019-08-29 DIAGNOSIS — M8580 Other specified disorders of bone density and structure, unspecified site: Secondary | ICD-10-CM | POA: Diagnosis not present

## 2019-08-29 DIAGNOSIS — R350 Frequency of micturition: Secondary | ICD-10-CM | POA: Diagnosis not present

## 2019-08-29 DIAGNOSIS — N183 Chronic kidney disease, stage 3 unspecified: Secondary | ICD-10-CM | POA: Diagnosis not present

## 2019-09-15 DIAGNOSIS — N1831 Chronic kidney disease, stage 3a: Secondary | ICD-10-CM | POA: Diagnosis not present

## 2019-09-15 DIAGNOSIS — E039 Hypothyroidism, unspecified: Secondary | ICD-10-CM | POA: Diagnosis not present

## 2019-09-15 DIAGNOSIS — Z6838 Body mass index (BMI) 38.0-38.9, adult: Secondary | ICD-10-CM | POA: Diagnosis not present

## 2019-09-15 DIAGNOSIS — F3341 Major depressive disorder, recurrent, in partial remission: Secondary | ICD-10-CM | POA: Diagnosis not present

## 2019-09-15 DIAGNOSIS — I129 Hypertensive chronic kidney disease with stage 1 through stage 4 chronic kidney disease, or unspecified chronic kidney disease: Secondary | ICD-10-CM | POA: Diagnosis not present

## 2019-09-15 DIAGNOSIS — Z0001 Encounter for general adult medical examination with abnormal findings: Secondary | ICD-10-CM | POA: Diagnosis not present

## 2019-09-15 DIAGNOSIS — F5101 Primary insomnia: Secondary | ICD-10-CM | POA: Diagnosis not present

## 2019-09-15 DIAGNOSIS — E782 Mixed hyperlipidemia: Secondary | ICD-10-CM | POA: Diagnosis not present

## 2019-09-15 DIAGNOSIS — F419 Anxiety disorder, unspecified: Secondary | ICD-10-CM | POA: Diagnosis not present

## 2019-09-15 DIAGNOSIS — N951 Menopausal and female climacteric states: Secondary | ICD-10-CM | POA: Diagnosis not present

## 2019-09-15 DIAGNOSIS — N390 Urinary tract infection, site not specified: Secondary | ICD-10-CM | POA: Diagnosis not present

## 2019-09-16 DIAGNOSIS — N951 Menopausal and female climacteric states: Secondary | ICD-10-CM | POA: Diagnosis not present

## 2019-09-16 DIAGNOSIS — R232 Flushing: Secondary | ICD-10-CM | POA: Diagnosis not present

## 2019-09-16 DIAGNOSIS — N183 Chronic kidney disease, stage 3 unspecified: Secondary | ICD-10-CM | POA: Diagnosis not present

## 2019-09-16 DIAGNOSIS — N3000 Acute cystitis without hematuria: Secondary | ICD-10-CM | POA: Diagnosis not present

## 2019-09-16 DIAGNOSIS — I129 Hypertensive chronic kidney disease with stage 1 through stage 4 chronic kidney disease, or unspecified chronic kidney disease: Secondary | ICD-10-CM | POA: Diagnosis not present

## 2019-09-16 DIAGNOSIS — F39 Unspecified mood [affective] disorder: Secondary | ICD-10-CM | POA: Diagnosis not present

## 2019-09-16 DIAGNOSIS — M8580 Other specified disorders of bone density and structure, unspecified site: Secondary | ICD-10-CM | POA: Diagnosis not present

## 2019-09-16 DIAGNOSIS — F3341 Major depressive disorder, recurrent, in partial remission: Secondary | ICD-10-CM | POA: Diagnosis not present

## 2019-09-16 DIAGNOSIS — N390 Urinary tract infection, site not specified: Secondary | ICD-10-CM | POA: Diagnosis not present

## 2019-09-16 DIAGNOSIS — N1831 Chronic kidney disease, stage 3a: Secondary | ICD-10-CM | POA: Diagnosis not present

## 2019-10-21 DIAGNOSIS — F3341 Major depressive disorder, recurrent, in partial remission: Secondary | ICD-10-CM | POA: Diagnosis not present

## 2019-10-21 DIAGNOSIS — I129 Hypertensive chronic kidney disease with stage 1 through stage 4 chronic kidney disease, or unspecified chronic kidney disease: Secondary | ICD-10-CM | POA: Diagnosis not present

## 2019-10-21 DIAGNOSIS — M8580 Other specified disorders of bone density and structure, unspecified site: Secondary | ICD-10-CM | POA: Diagnosis not present

## 2019-10-21 DIAGNOSIS — R21 Rash and other nonspecific skin eruption: Secondary | ICD-10-CM | POA: Diagnosis not present

## 2019-10-21 DIAGNOSIS — N390 Urinary tract infection, site not specified: Secondary | ICD-10-CM | POA: Diagnosis not present

## 2019-10-21 DIAGNOSIS — N3000 Acute cystitis without hematuria: Secondary | ICD-10-CM | POA: Diagnosis not present

## 2019-10-21 DIAGNOSIS — N183 Chronic kidney disease, stage 3 unspecified: Secondary | ICD-10-CM | POA: Diagnosis not present

## 2019-10-21 DIAGNOSIS — R232 Flushing: Secondary | ICD-10-CM | POA: Diagnosis not present

## 2019-10-21 DIAGNOSIS — N951 Menopausal and female climacteric states: Secondary | ICD-10-CM | POA: Diagnosis not present

## 2019-10-21 DIAGNOSIS — F39 Unspecified mood [affective] disorder: Secondary | ICD-10-CM | POA: Diagnosis not present

## 2019-10-21 DIAGNOSIS — N1831 Chronic kidney disease, stage 3a: Secondary | ICD-10-CM | POA: Diagnosis not present

## 2020-01-12 DIAGNOSIS — F39 Unspecified mood [affective] disorder: Secondary | ICD-10-CM | POA: Diagnosis not present

## 2020-01-12 DIAGNOSIS — M8580 Other specified disorders of bone density and structure, unspecified site: Secondary | ICD-10-CM | POA: Diagnosis not present

## 2020-01-12 DIAGNOSIS — N390 Urinary tract infection, site not specified: Secondary | ICD-10-CM | POA: Diagnosis not present

## 2020-01-12 DIAGNOSIS — N951 Menopausal and female climacteric states: Secondary | ICD-10-CM | POA: Diagnosis not present

## 2020-01-12 DIAGNOSIS — R232 Flushing: Secondary | ICD-10-CM | POA: Diagnosis not present

## 2020-01-12 DIAGNOSIS — Z6838 Body mass index (BMI) 38.0-38.9, adult: Secondary | ICD-10-CM | POA: Diagnosis not present

## 2020-01-12 DIAGNOSIS — N3000 Acute cystitis without hematuria: Secondary | ICD-10-CM | POA: Diagnosis not present

## 2020-01-12 DIAGNOSIS — R600 Localized edema: Secondary | ICD-10-CM | POA: Diagnosis not present

## 2020-01-12 DIAGNOSIS — I129 Hypertensive chronic kidney disease with stage 1 through stage 4 chronic kidney disease, or unspecified chronic kidney disease: Secondary | ICD-10-CM | POA: Diagnosis not present

## 2020-01-12 DIAGNOSIS — R350 Frequency of micturition: Secondary | ICD-10-CM | POA: Diagnosis not present

## 2020-01-20 DIAGNOSIS — Z23 Encounter for immunization: Secondary | ICD-10-CM | POA: Diagnosis not present

## 2020-03-23 DIAGNOSIS — Z6838 Body mass index (BMI) 38.0-38.9, adult: Secondary | ICD-10-CM | POA: Diagnosis not present

## 2020-03-23 DIAGNOSIS — N951 Menopausal and female climacteric states: Secondary | ICD-10-CM | POA: Diagnosis not present

## 2020-03-23 DIAGNOSIS — M8580 Other specified disorders of bone density and structure, unspecified site: Secondary | ICD-10-CM | POA: Diagnosis not present

## 2020-03-23 DIAGNOSIS — R350 Frequency of micturition: Secondary | ICD-10-CM | POA: Diagnosis not present

## 2020-03-23 DIAGNOSIS — N390 Urinary tract infection, site not specified: Secondary | ICD-10-CM | POA: Diagnosis not present

## 2020-03-23 DIAGNOSIS — R600 Localized edema: Secondary | ICD-10-CM | POA: Diagnosis not present

## 2020-03-23 DIAGNOSIS — N3000 Acute cystitis without hematuria: Secondary | ICD-10-CM | POA: Diagnosis not present

## 2020-03-23 DIAGNOSIS — I129 Hypertensive chronic kidney disease with stage 1 through stage 4 chronic kidney disease, or unspecified chronic kidney disease: Secondary | ICD-10-CM | POA: Diagnosis not present

## 2020-03-23 DIAGNOSIS — R232 Flushing: Secondary | ICD-10-CM | POA: Diagnosis not present

## 2020-03-23 DIAGNOSIS — F39 Unspecified mood [affective] disorder: Secondary | ICD-10-CM | POA: Diagnosis not present

## 2020-03-25 DIAGNOSIS — N1831 Chronic kidney disease, stage 3a: Secondary | ICD-10-CM | POA: Diagnosis not present

## 2020-03-25 DIAGNOSIS — E039 Hypothyroidism, unspecified: Secondary | ICD-10-CM | POA: Diagnosis not present

## 2020-03-25 DIAGNOSIS — E782 Mixed hyperlipidemia: Secondary | ICD-10-CM | POA: Diagnosis not present

## 2020-03-25 DIAGNOSIS — F3341 Major depressive disorder, recurrent, in partial remission: Secondary | ICD-10-CM | POA: Diagnosis not present

## 2020-03-25 DIAGNOSIS — N651 Disproportion of reconstructed breast: Secondary | ICD-10-CM | POA: Diagnosis not present

## 2020-03-25 DIAGNOSIS — I129 Hypertensive chronic kidney disease with stage 1 through stage 4 chronic kidney disease, or unspecified chronic kidney disease: Secondary | ICD-10-CM | POA: Diagnosis not present

## 2020-03-25 DIAGNOSIS — F5101 Primary insomnia: Secondary | ICD-10-CM | POA: Diagnosis not present

## 2020-03-25 DIAGNOSIS — F419 Anxiety disorder, unspecified: Secondary | ICD-10-CM | POA: Diagnosis not present

## 2020-05-10 ENCOUNTER — Other Ambulatory Visit (HOSPITAL_COMMUNITY): Payer: Self-pay | Admitting: Internal Medicine

## 2020-05-10 DIAGNOSIS — Z1231 Encounter for screening mammogram for malignant neoplasm of breast: Secondary | ICD-10-CM

## 2020-05-13 ENCOUNTER — Ambulatory Visit (HOSPITAL_COMMUNITY)
Admission: RE | Admit: 2020-05-13 | Discharge: 2020-05-13 | Disposition: A | Discharge status: Home / Self Care | Payer: Medicare Other | Source: Ambulatory Visit | Attending: Internal Medicine | Admitting: Internal Medicine

## 2020-05-13 ENCOUNTER — Other Ambulatory Visit: Payer: Self-pay

## 2020-05-13 DIAGNOSIS — N651 Disproportion of reconstructed breast: Secondary | ICD-10-CM | POA: Diagnosis not present

## 2020-05-13 DIAGNOSIS — Z1231 Encounter for screening mammogram for malignant neoplasm of breast: Secondary | ICD-10-CM

## 2020-05-13 DIAGNOSIS — N3000 Acute cystitis without hematuria: Secondary | ICD-10-CM | POA: Diagnosis not present

## 2020-05-13 DIAGNOSIS — I129 Hypertensive chronic kidney disease with stage 1 through stage 4 chronic kidney disease, or unspecified chronic kidney disease: Secondary | ICD-10-CM | POA: Diagnosis not present

## 2020-05-13 DIAGNOSIS — Z6838 Body mass index (BMI) 38.0-38.9, adult: Secondary | ICD-10-CM | POA: Diagnosis not present

## 2020-05-13 DIAGNOSIS — N951 Menopausal and female climacteric states: Secondary | ICD-10-CM | POA: Diagnosis not present

## 2020-05-13 DIAGNOSIS — F39 Unspecified mood [affective] disorder: Secondary | ICD-10-CM | POA: Diagnosis not present

## 2020-05-13 DIAGNOSIS — R600 Localized edema: Secondary | ICD-10-CM | POA: Diagnosis not present

## 2020-05-13 DIAGNOSIS — M8580 Other specified disorders of bone density and structure, unspecified site: Secondary | ICD-10-CM | POA: Diagnosis not present

## 2020-05-13 DIAGNOSIS — Z23 Encounter for immunization: Secondary | ICD-10-CM | POA: Diagnosis not present

## 2020-05-13 DIAGNOSIS — N390 Urinary tract infection, site not specified: Secondary | ICD-10-CM | POA: Diagnosis not present

## 2020-05-13 DIAGNOSIS — R350 Frequency of micturition: Secondary | ICD-10-CM | POA: Diagnosis not present

## 2020-07-07 ENCOUNTER — Ambulatory Visit: Payer: Medicare Other | Admitting: Urology

## 2020-08-02 ENCOUNTER — Ambulatory Visit (INDEPENDENT_AMBULATORY_CARE_PROVIDER_SITE_OTHER): Payer: Medicare Other | Admitting: Urology

## 2020-08-02 ENCOUNTER — Other Ambulatory Visit: Payer: Self-pay

## 2020-08-02 ENCOUNTER — Encounter: Payer: Self-pay | Admitting: Urology

## 2020-08-02 VITALS — BP 164/93 | HR 105 | Temp 97.6°F

## 2020-08-02 DIAGNOSIS — N3000 Acute cystitis without hematuria: Secondary | ICD-10-CM | POA: Diagnosis not present

## 2020-08-02 DIAGNOSIS — R600 Localized edema: Secondary | ICD-10-CM | POA: Diagnosis not present

## 2020-08-02 DIAGNOSIS — N390 Urinary tract infection, site not specified: Secondary | ICD-10-CM | POA: Diagnosis not present

## 2020-08-02 DIAGNOSIS — N651 Disproportion of reconstructed breast: Secondary | ICD-10-CM | POA: Diagnosis not present

## 2020-08-02 DIAGNOSIS — R3129 Other microscopic hematuria: Secondary | ICD-10-CM | POA: Diagnosis not present

## 2020-08-02 DIAGNOSIS — I129 Hypertensive chronic kidney disease with stage 1 through stage 4 chronic kidney disease, or unspecified chronic kidney disease: Secondary | ICD-10-CM | POA: Diagnosis not present

## 2020-08-02 DIAGNOSIS — R350 Frequency of micturition: Secondary | ICD-10-CM | POA: Diagnosis not present

## 2020-08-02 DIAGNOSIS — F39 Unspecified mood [affective] disorder: Secondary | ICD-10-CM | POA: Diagnosis not present

## 2020-08-02 DIAGNOSIS — R8271 Bacteriuria: Secondary | ICD-10-CM | POA: Diagnosis not present

## 2020-08-02 DIAGNOSIS — T162XXA Foreign body in left ear, initial encounter: Secondary | ICD-10-CM | POA: Diagnosis not present

## 2020-08-02 DIAGNOSIS — N951 Menopausal and female climacteric states: Secondary | ICD-10-CM | POA: Diagnosis not present

## 2020-08-02 DIAGNOSIS — Z6838 Body mass index (BMI) 38.0-38.9, adult: Secondary | ICD-10-CM | POA: Diagnosis not present

## 2020-08-02 DIAGNOSIS — M8580 Other specified disorders of bone density and structure, unspecified site: Secondary | ICD-10-CM | POA: Diagnosis not present

## 2020-08-02 LAB — URINALYSIS, ROUTINE W REFLEX MICROSCOPIC
Bilirubin, UA: NEGATIVE
Glucose, UA: NEGATIVE
Ketones, UA: NEGATIVE
Nitrite, UA: POSITIVE — AB
Specific Gravity, UA: 1.025 (ref 1.005–1.030)
Urobilinogen, Ur: 0.2 mg/dL (ref 0.2–1.0)
pH, UA: 5.5 (ref 5.0–7.5)

## 2020-08-02 LAB — MICROSCOPIC EXAMINATION: Renal Epithel, UA: NONE SEEN /hpf

## 2020-08-02 NOTE — Patient Instructions (Signed)
Obstetrics: Normal and problem pregnancies (7th ed., pp. [id number]). [place], [place]: Elsevier."> [facility] (12th ed., pp. [phone]). [place], [place]: Elsevier.">  Asymptomatic Bacteriuria Asymptomatic bacteriuria is the presence of a large number of bacteria in the urine without the usual symptoms of burning or frequent urination. This is usually not harmful, and treatment may not be needed. A person with this condition will not be more likely to develop an infection in the future. What are the causes? This condition is caused by an increase in bacteria in the urine. This increase can be caused by:  Bacteria entering the urinary tract, such as during sex.  A blockage in the urinary tract, such as from kidney stones or a tumor.  Bladder problems that prevent the bladder from emptying.   What increases the risk? You are more likely to develop this condition if:  You have diabetes.  You are an older adult. This especially affects older adults in long-term care facilities.  You are pregnant and in the first trimester.  You have kidney stones.  You are female.  You have a leaky kidney tube valve (reflux).  You had a urinary catheter for a long period of time. This is a long, thin tube that collects urine. What are the signs or symptoms? There are no symptoms of this condition. How is this diagnosed? This condition is diagnosed with a urine test. Because this condition does not cause symptoms, it is usually diagnosed when a urine sample is taken to treat or diagnose another condition, such as pregnancy or kidney problems. Most women who are in their first trimester of pregnancy are screened for asymptomatic bacteriuria. How is this treated? Usually, treatment is not needed for this condition. Treating the condition can lead to other problems, such as a yeast infection or the growth of bacteria that do not respond to treatment (antibiotic-resistant bacteria). Some people  do need treatment with antibiotic medicines to prevent kidney infection, known as pyelonephritis. Treatment is needed if:  You are pregnant. In pregnant women, kidney infection can lead to: ? Early labor (premature labor). ? Very low birth weight (fetal growth restriction). ? Newborn death.  You are having a procedure that affects the urinary tract.  You have had a kidney transplant. If you are diagnosed with this condition, talk with your health care provider about any concerns that you have. Follow these instructions at home: Medicines  Take over-the-counter and prescription medicines only as told by your health care provider.  If you were prescribed an antibiotic medicine, take it as told by your health care provider. Do not stop using the antibiotic even if you start to feel better. General instructions  Monitor your condition for any changes.  Drink enough fluid to keep your urine pale yellow.  Urinate more often to keep your bladder empty.  If you are female, keep the area around your vagina and rectum clean. Wipe from front to back after urinating or having a bowel movement. Use each piece of toilet paper only once.  Keep all follow-up visits. This is important. Contact a health care provider if:  You have symptoms of a urine infection, such as: ? A burning sensation, or pain when you urinate. ? A strong need to urinate, or urinating more often. ? Urine turning discolored or cloudy. ? Blood in your urine. ? Urine that smells bad. Get help right away if:  You develop signs of a kidney infection, such as: ? Back pain or pelvic pain. ? A fever  or chills. ? Nausea or vomiting. ? Severe pain that cannot be controlled with medicine. Summary  Asymptomatic bacteriuria is the presence of a large number of bacteria in the urine without the usual symptoms of burning or frequent urination.  Usually, treatment is not needed for this condition. Treating the condition can lead  to other problems, such as a yeast infection or the growth of bacteria that do not respond to treatment.  Some people do need treatment. Treatment is needed if you are pregnant, if you are having a procedure that affects the urinary tract, or if you have had a kidney transplant.  If you were prescribed an antibiotic medicine, take it as told by your health care provider. Do not stop using the antibiotic even if you start to feel better. This information is not intended to replace advice given to you by your health care provider. Make sure you discuss any questions you have with your health care provider. Document Revised: 01/09/2020 Document Reviewed: 01/09/2020 Elsevier Patient Education  2021 [employer].

## 2020-08-02 NOTE — Progress Notes (Signed)
[facility]   08/02/2020 2:32 PM   [name] November 23, 1950 [id number]  Referring provider: Benita Stabile, MD [address] [place],  [place] [address]  No chief complaint on file.   HPI:  She is referred for recurrent UTI. She has an "odor" of the urine. No cloudiness. No dysuria or gross hematuria. She has no NG risk. I saw her urine culture from 2018 and 2020 + for E. coli but no recent cultures. She has had "2-3 bladder tacks" and a partial hysterectomy. She is on hormone replacement therapy with estradiol daily. UA today with 3-10 rbcs and many bacteria. Again, no dysuria or bladder pain.   Non-smoker. No other exposures.    PMH: Past Medical History:  Diagnosis Date  . Anxiety   . Arthritis   . Borderline hypertension   . Chronic lower back pain   . Chronic shoulder pain   . Depression   . Fe deficiency anemia   . GERD (gastroesophageal reflux disease)   . Heartburn   . Hiatal hernia   . History of cardiac catheterization    Normal coronaries  . History of mixed drug abuse (HCC)   . Hyperlipidemia   . Hypertension   . Hypothyroidism    [name]   . Lumbar herniated disc    L4-L5   . Microscopic hematuria   . Obesity   . Personal history of venous thrombosis and embolism   . PSVT (paroxysmal supraventricular tachycardia) (HCC) 8/05  . Recurrent cystitis    Dr. Aldean Ast     Surgical History: Past Surgical History:  Procedure Laterality Date  . APPENDECTOMY  2004?or 11/03?  [name] BACK SURGERY    . BLADDER REPAIR    . CARPAL TUNNEL RELEASE     x2   . HARDWARE REMOVAL Right 02/18/2016   Procedure: HARDWARE REMOVAL;  Surgeon: Vickki Hearing, MD;  Location: [facility];  Service: Orthopedics;  Laterality: Right;  right ankle medial malleolar screw  . HARDWARE REMOVAL Right 03/08/2016   Procedure: HARDWARE REMOVAL AND INTERNAL FIXATION OF RIGHT MEDIAL MALLEOLUS  - RIGHT ANKLE REMOVAL OF MALLEOLAR SCREW, INSERTION OF 2  0.45 K WIRES;  Surgeon: Vickki Hearing, MD;  Location: [facility];  Service: Orthopedics;  Laterality: Right;  RIGHT ANKLE REMOVAL OF MALLEOLAR SCREW  . LIVER BIOPSY  2002   Dr. Katrinka Blazing   . ORIF ANKLE FRACTURE Right 02/02/2016   Procedure: OPEN REDUCTION INTERNAL FIXATION (ORIF) ANKLE FRACTURE;  Surgeon: Vickki Hearing, MD;  Location: [facility];  Service: Orthopedics;  Laterality: Right;  . ORIF ANKLE FRACTURE Right 02/18/2016   Procedure: OPEN REDUCTION INTERNAL FIXATION (ORIF) ANKLE FRACTURE;  Surgeon: Vickki Hearing, MD;  Location: [facility];  Service: Orthopedics;  Laterality: Right;  reinsertion of medial malleolar screw  . PARTIAL HYSTERECTOMY  2004   One ovary   . RECTAL PROLAPSE REPAIR    . RECTOCELE REPAIR  05/2001  . Right knee arthroscopy  8/05  . Right knee replacement  8/05  . SHOULDER SURGERY      Home Medications:  Allergies as of 08/02/2020      Reactions   Clindamycin    REACTION: caused C-diff per patient   Diphenhydramine Hcl Other (See Comments)   REACTION:  Severely drowsy   Naproxen    Burning in lower abdominal area.      Medication List       Accurate as of August 02, 2020  2:32 PM. If you  have any questions, ask your nurse or doctor.        STOP taking these medications   aspirin EC 81 MG tablet Stopped by: Jerilee Field, MD   chlorthalidone 25 MG tablet Commonly known as: HYGROTON Stopped by: Jerilee Field, MD   rosuvastatin 10 MG tablet Commonly known as: CRESTOR Stopped by: Jerilee Field, MD     TAKE these medications   albuterol 108 (90 Base) MCG/ACT inhaler Commonly known as: VENTOLIN HFA Inhale into the lungs every 6 (six) hours as needed for wheezing or shortness of breath.   Amitiza 24 MCG capsule Generic drug: lubiprostone TAKE ONE CAPSULE BY MOUTH TWICE A DAY   clonazePAM 1 MG tablet Commonly known as: KLONOPIN Take 1 mg by mouth 3 (three) times daily as needed.   cyclobenzaprine 10 MG tablet Commonly known as: FLEXERIL TAKE ONE TABLET BY MOUTH  THREE TIMES DAILY AS NEEDED FOR MUSCLE SPASM   escitalopram 10 MG tablet Commonly known as: LEXAPRO Take 10 mg by mouth daily.   estradiol 0.5 MG tablet Commonly known as: ESTRACE Take 1 tablet by mouth daily.   ezetimibe 10 MG tablet Commonly known as: ZETIA Take 10 mg by mouth daily.   Fish Oil 1200 MG Caps Take 1 capsule by mouth 2 (two) times daily.   furosemide 20 MG tablet Commonly known as: LASIX TAKE ONE (1) TABLET EACH DAY   levothyroxine 75 MCG tablet Commonly known as: SYNTHROID TAKE ONE (1) TABLET EACH DAY   Linzess 290 MCG Caps capsule Generic drug: linaclotide Take 290 mcg by mouth daily.   metoprolol succinate 50 MG 24 hr tablet Commonly known as: TOPROL-XL TAKE ONE TABLET BY MOUTH TWICE DAILY   omeprazole 20 MG capsule Commonly known as: PRILOSEC TAKE ONE CAPSULE BY MOUTH TWICE A DAY   potassium chloride 10 MEQ tablet Commonly known as: KLOR-CON TAKE FIVE TABLETS TWICE DAILY   Restasis 0.05 % ophthalmic emulsion Generic drug: cycloSPORINE INSTILL ONE DROP IN BOTH EYES TWICE DAILY   traZODone 50 MG tablet Commonly known as: DESYREL Take 50 mg by mouth at bedtime.   Vascepa 1 g capsule Generic drug: icosapent Ethyl Take 2 g by mouth 2 (two) times daily.   venlafaxine XR 75 MG 24 hr capsule Commonly known as: EFFEXOR-XR TAKE 3 CAPSULES EVERY MORNING       Allergies:  Allergies  Allergen Reactions  . Clindamycin     REACTION: caused C-diff per patient  . Diphenhydramine Hcl Other (See Comments)    REACTION:  Severely drowsy  . Naproxen     Burning in lower abdominal area.    Family History: Family History  Problem Relation Age of Onset  . Breast cancer Mother   . Breast cancer Sister   . Heart attack Other        Family history   . Coronary artery disease Other        Family History     Social History:  reports that she has never smoked. She has never used smokeless tobacco. She reports that she does not drink alcohol and  does not use drugs.   Physical Exam: BP (!) 164/93   Pulse (!) 105   Temp 97.6 F (36.4 C)   Constitutional:  Alert and oriented, No acute distress. HEENT: [place] AT, moist mucus membranes.  Trachea midline, no masses. Cardiovascular: No clubbing, cyanosis, or edema. Respiratory: Normal respiratory effort, no increased work of breathing. GI: Abdomen is soft, nontender, nondistended, no abdominal masses GU:  No CVA tenderness Skin: No rashes, bruises or suspicious lesions. Neurologic: Grossly intact, no focal deficits, moving all 4 extremities. Psychiatric: Normal mood and affect.  Laboratory Data: Lab Results  Component Value Date   WBC 9.3 11/18/2016   HGB 12.8 11/18/2016   HCT 36.8 11/18/2016   MCV 86.8 11/18/2016   PLT 249 11/18/2016    Lab Results  Component Value Date   CREATININE 0.96 11/18/2016    No results found for: PSA  No results found for: TESTOSTERONE  Lab Results  Component Value Date   HGBA1C 5.5 12/09/2013    Urinalysis    Component Value Date/Time   COLORURINE YELLOW 11/22/2016 1310   APPEARANCEUR CLOUDY (A) 11/22/2016 1310   APPEARANCEUR Clear 07/05/2016 1525   LABSPEC 1.025 11/22/2016 1310   PHURINE 6.0 11/22/2016 1310   GLUCOSEU NEGATIVE 11/22/2016 1310   HGBUR SMALL (A) 11/22/2016 1310   BILIRUBINUR NEGATIVE 11/22/2016 1310   BILIRUBINUR Negative 07/05/2016 1525   KETONESUR TRACE (A) 11/22/2016 1310   PROTEINUR 30 (A) 11/22/2016 1310   UROBILINOGEN negative 06/29/2015 1609   UROBILINOGEN 0.2 09/17/2012 1456   NITRITE POSITIVE (A) 11/22/2016 1310   LEUKOCYTESUR LARGE (A) 11/22/2016 1310   LEUKOCYTESUR Negative 07/05/2016 1525    Lab Results  Component Value Date   LABMICR See below: 06/20/2016   WBCUA >30 (A) 06/20/2016   RBCUA 11-30 (A) 06/20/2016   LABEPIT >10 (A) 06/20/2016   MUCUS occ 10/26/2014   BACTERIA MANY (A) 11/22/2016    Pertinent Imaging: n/a Results for orders placed in visit on 08/26/13  DG Abd 1  View  Narrative CLINICAL DATA:  70 year old female with abdominal pain.  EXAM: ABDOMEN - 1 VIEW  COMPARISON:  None.  FINDINGS: The bowel gas pattern is normal.  No suspicious calcifications are identified.  Postoperative changes within the lower lumbar spine noted.  No acute bony abnormalities are identified.  IMPRESSION: No acute abnormalities.   Electronically Signed By: Laveda Abbe [name] On: 08/26/2013 16:38  No results found for this or any previous visit.  No results found for this or any previous visit.  No results found for this or any previous visit.  No results found for this or any previous visit.  No results found for this or any previous visit.  No results found for this or any previous visit.  No results found for this or any previous visit.   Assessment & Plan:    1. Bacteriuria - we discussed urine odor can be from bacteria, dietary supplements or food we eat, but is not an infection and doesn't need abx. She needs to increase fluid and avoid bladder irritants.  - Urinalysis, Routine w reflex microscopic.  I did send urine for cx and we might consider abx prior to her cystoscopy.   2. Microhematuria - will set her up for a renal US and cystoscopy. Will also eval the above.   No follow-ups on file.  Jerilee Field, MD

## 2020-08-02 NOTE — Progress Notes (Signed)
Urological Symptom Review  Patient is experiencing the following symptoms: Leakage of urine Urinary tract infection   Review of Systems  Gastrointestinal (upper)  : Negative for upper GI symptoms  Gastrointestinal (lower) : Negative for lower GI symptoms  Constitutional : Fatigue  Skin: Negative for skin symptoms  Eyes: Negative for eye symptoms  Ear/Nose/Throat : Negative for Ear/Nose/Throat symptoms  Hematologic/Lymphatic: Negative for Hematologic/Lymphatic symptoms  Cardiovascular : Leg swelling  Respiratory : Shortness of breath  Endocrine: Excessive thirst  Musculoskeletal: Back pain Joint pain  Neurological: Negative for neurological symptoms  Psychologic: Depression Anxiety

## 2020-08-06 LAB — URINE CULTURE

## 2020-08-09 ENCOUNTER — Other Ambulatory Visit: Payer: Self-pay

## 2020-08-09 DIAGNOSIS — N390 Urinary tract infection, site not specified: Secondary | ICD-10-CM

## 2020-08-09 MED ORDER — NITROFURANTOIN MONOHYD MACRO 100 MG PO CAPS: 100.0000 mg | ORAL_CAPSULE | Freq: Two times a day (BID) | ORAL | 0 refills | Status: DC

## 2020-08-09 NOTE — Progress Notes (Signed)
Patient called today about urine culture results- reviewed with Dr. Ronne Binning in Dr. Mena Goes absence. macrobid sent to pharmacy per Dr. Ronne Binning. Pt notified.

## 2020-08-10 ENCOUNTER — Telehealth: Payer: Self-pay | Admitting: Urology

## 2020-08-10 ENCOUNTER — Ambulatory Visit: Payer: Medicare Other | Admitting: Cardiology

## 2020-08-10 NOTE — Telephone Encounter (Signed)
Pt called wanting results of the urine sample.

## 2020-08-10 NOTE — Telephone Encounter (Signed)
Called number listed. I did not leave voicemail- number listed is Nadeen Landau voicemail-  I spoke with Ms. Mordecai Maes yesterday and notified her of the urine culture needing an antibiotic and was sent to pharmacy.

## 2020-08-10 NOTE — Progress Notes (Deleted)
Cardiology Office Note  Date: 08/10/2020   ID: [name], DOB Nov 23, 1950, MRN [id number]  PCP:  [name] Stabile, MD  Cardiologist:  [name] Dell, MD Electrophysiologist:  None   No chief complaint on file.   History of Present Illness: [name] is a 70 y.o. female last assessed via telehealth encounter in January 2021 by Mr. [name] Hews NP.  Past Medical History:  Diagnosis Date  . Anxiety   . Arthritis   . Borderline hypertension   . Chronic lower back pain   . Chronic shoulder pain   . Depression   . Fe deficiency anemia   . GERD (gastroesophageal reflux disease)   . Heartburn   . Hiatal hernia   . History of cardiac catheterization    Normal coronaries  . History of mixed drug abuse (HCC)   . Hyperlipidemia   . Hypertension   . Hypothyroidism    [name]   . Lumbar herniated disc    L4-L5   . Microscopic hematuria   . Obesity   . Personal history of venous thrombosis and embolism   . PSVT (paroxysmal supraventricular tachycardia) (HCC) 8/05  . Recurrent cystitis    Dr. Aldean Ast     Past Surgical History:  Procedure Laterality Date  . APPENDECTOMY  2004?or 11/03?  [name] BACK SURGERY    . BLADDER REPAIR    . CARPAL TUNNEL RELEASE     x2   . HARDWARE REMOVAL Right 02/18/2016   Procedure: HARDWARE REMOVAL;  Surgeon: [name] Hearing, MD;  Location: [facility];  Service: Orthopedics;  Laterality: Right;  right ankle medial malleolar screw  . HARDWARE REMOVAL Right 03/08/2016   Procedure: HARDWARE REMOVAL AND INTERNAL FIXATION OF RIGHT MEDIAL MALLEOLUS  - RIGHT ANKLE REMOVAL OF MALLEOLAR SCREW, INSERTION OF 2  0.45 K WIRES;  Surgeon: [name] Hearing, MD;  Location: [facility];  Service: Orthopedics;  Laterality: Right;  RIGHT ANKLE REMOVAL OF MALLEOLAR SCREW  . LIVER BIOPSY  2002   [name]   . ORIF ANKLE FRACTURE Right 02/02/2016   Procedure: OPEN REDUCTION INTERNAL FIXATION (ORIF) ANKLE FRACTURE;  Surgeon: [name] Hearing, MD;  Location: [facility];  Service:  Orthopedics;  Laterality: Right;  . ORIF ANKLE FRACTURE Right 02/18/2016   Procedure: OPEN REDUCTION INTERNAL FIXATION (ORIF) ANKLE FRACTURE;  Surgeon: [name] Hearing, MD;  Location: [facility];  Service: Orthopedics;  Laterality: Right;  reinsertion of medial malleolar screw  . PARTIAL HYSTERECTOMY  2004   One ovary   . RECTAL PROLAPSE REPAIR    . RECTOCELE REPAIR  05/2001  . Right knee arthroscopy  8/05  . Right knee replacement  8/05  . SHOULDER SURGERY      Current Outpatient Medications  Medication Sig Dispense Refill  . albuterol (VENTOLIN HFA) 108 (90 Base) MCG/ACT inhaler Inhale into the lungs every 6 (six) hours as needed for wheezing or shortness of breath.    . AMITIZA 24 MCG capsule TAKE ONE CAPSULE BY MOUTH TWICE A DAY 60 capsule 5  . clonazePAM (KLONOPIN) 1 MG tablet Take 1 mg by mouth 3 (three) times daily as needed.    . cyclobenzaprine (FLEXERIL) 10 MG tablet TAKE ONE TABLET BY MOUTH THREE TIMES DAILY AS NEEDED FOR MUSCLE SPASM 90 tablet 5  . escitalopram (LEXAPRO) 10 MG tablet Take 10 mg by mouth daily.    [name] estradiol (ESTRACE) 0.5 MG tablet Take 1 tablet by mouth daily.    [name] ezetimibe (ZETIA) 10 MG tablet  Take 10 mg by mouth daily.    . furosemide (LASIX) 20 MG tablet TAKE ONE (1) TABLET EACH DAY 30 tablet 2  . levothyroxine (SYNTHROID, LEVOTHROID) 75 MCG tablet TAKE ONE (1) TABLET EACH DAY 90 tablet 3  . LINZESS 290 MCG CAPS capsule Take 290 mcg by mouth daily.    . metoprolol succinate (TOPROL-XL) 50 MG 24 hr tablet TAKE ONE TABLET BY MOUTH TWICE DAILY 180 tablet 0  . nitrofurantoin, macrocrystal-monohydrate, (MACROBID) 100 MG capsule Take 1 capsule (100 mg total) by mouth every 12 (twelve) hours. 14 capsule 0  . Omega-3 Fatty Acids (FISH OIL) 1200 MG CAPS Take 1 capsule by mouth 2 (two) times daily.    [name] omeprazole (PRILOSEC) 20 MG capsule TAKE ONE CAPSULE BY MOUTH TWICE A DAY 60 capsule 5  . potassium chloride (K-DUR) 10 MEQ tablet TAKE FIVE TABLETS TWICE DAILY 300  tablet 2  . RESTASIS 0.05 % ophthalmic emulsion INSTILL ONE DROP IN BOTH EYES TWICE DAILY 60 each 11  . traZODone (DESYREL) 50 MG tablet Take 50 mg by mouth at bedtime.    [name] VASCEPA 1 g capsule Take 2 g by mouth 2 (two) times daily.    [name] venlafaxine XR (EFFEXOR-XR) 75 MG 24 hr capsule TAKE 3 CAPSULES EVERY MORNING 90 capsule 1   No current facility-administered medications for this visit.   Allergies:  Clindamycin, Diphenhydramine hcl, and Naproxen   Social History: The patient  reports that she has never smoked. She has never used smokeless tobacco. She reports that she does not drink alcohol and does not use drugs.   Family History: The patient's family history includes Breast cancer in her mother and sister; Coronary artery disease in an other family member; Heart attack in an other family member.   ROS:  Please see the history of present illness. Otherwise, complete review of systems is positive for {NONE DEFAULTED:18576::"none"}.  All other systems are reviewed and negative.   Physical Exam: VS:  There were no vitals taken for this visit., BMI There is no height or weight on file to calculate BMI.  Wt Readings from Last 3 Encounters:  06/26/19 235 lb (106.6 kg)  05/03/18 241 lb 9.6 oz (109.6 kg)  11/22/16 215 lb (97.5 kg)    General: Patient appears comfortable at rest. HEENT: Conjunctiva and lids normal, oropharynx clear with moist mucosa. Neck: Supple, no elevated JVP or carotid bruits, no thyromegaly. Lungs: Clear to auscultation, nonlabored breathing at rest. Cardiac: Regular rate and rhythm, no S3 or significant systolic murmur, no pericardial rub. Abdomen: Soft, nontender, no hepatomegaly, bowel sounds present, no guarding or rebound. Extremities: No pitting edema, distal pulses 2+. Skin: Warm and dry. Musculoskeletal: No kyphosis. Neuropsychiatric: Alert and oriented x3, affect grossly appropriate.  ECG:  An ECG dated 05/03/2018 was personally reviewed today and  demonstrated:  Sinus rhythm with right bundle branch block.  Recent Labwork:    Component Value Date/Time   CHOL 326 (H) 07/05/2016 1459   CHOL 166 01/02/2013 1146   TRIG 202 (H) 07/05/2016 1459   TRIG 122 10/26/2014 1655   TRIG 103 01/02/2013 1146   HDL 60 07/05/2016 1459   HDL 72 10/26/2014 1655   HDL 75 01/02/2013 1146   CHOLHDL 5.4 (H) 07/05/2016 1459   LDLCALC 226 (H) 07/05/2016 1459   LDLCALC 57 12/09/2013 1048   LDLCALC 70 01/02/2013 1146  September 2020: Hemoglobin 13.8, platelets 06/13/2014, BUN 14, creatinine 1.0, potassium 4.5, AST 21, ALT 20, cholesterol 178, triglycerides 175,  HDL 62, LDL 86  Other Studies Reviewed Today:  Echocardiogram 05/07/2018: - Left ventricle: The cavity size was normal. Wall thickness was  normal. Systolic function was normal. The estimated ejection  fraction was in the range of 55% to 60%. Wall motion was normal;  there were no regional wall motion abnormalities. Left  ventricular diastolic function parameters were normal.  - Atrial septum: No defect or patent foramen ovale was identified.   Assessment and Plan:    Medication Adjustments/Labs and Tests Ordered: Current medicines are reviewed at length with the patient today.  Concerns regarding medicines are outlined above.   Tests Ordered: No orders of the defined types were placed in this encounter.   Medication Changes: No orders of the defined types were placed in this encounter.   Disposition:  Follow up {follow up:15908}  Signed, Jonelle Sidle, MD, [facility] 08/10/2020 8:19 AM    [facility] [address] [place], [place], [place] [address] Phone: [phone]; Fax: [phone]

## 2020-08-11 NOTE — Telephone Encounter (Signed)
Spoke with pt and she said she did know about infection and antibiotic being sent in. Wanted to know who would call about scheduling Renal US. Explained Central scheduling would call to set that up.

## 2020-08-16 DIAGNOSIS — I129 Hypertensive chronic kidney disease with stage 1 through stage 4 chronic kidney disease, or unspecified chronic kidney disease: Secondary | ICD-10-CM | POA: Diagnosis not present

## 2020-08-16 DIAGNOSIS — N1831 Chronic kidney disease, stage 3a: Secondary | ICD-10-CM | POA: Diagnosis not present

## 2020-08-16 DIAGNOSIS — Z6838 Body mass index (BMI) 38.0-38.9, adult: Secondary | ICD-10-CM | POA: Diagnosis not present

## 2020-08-16 DIAGNOSIS — F419 Anxiety disorder, unspecified: Secondary | ICD-10-CM | POA: Diagnosis not present

## 2020-08-16 DIAGNOSIS — F5101 Primary insomnia: Secondary | ICD-10-CM | POA: Diagnosis not present

## 2020-08-16 DIAGNOSIS — E782 Mixed hyperlipidemia: Secondary | ICD-10-CM | POA: Diagnosis not present

## 2020-08-16 DIAGNOSIS — N651 Disproportion of reconstructed breast: Secondary | ICD-10-CM | POA: Diagnosis not present

## 2020-08-16 DIAGNOSIS — E039 Hypothyroidism, unspecified: Secondary | ICD-10-CM | POA: Diagnosis not present

## 2020-08-16 DIAGNOSIS — F3341 Major depressive disorder, recurrent, in partial remission: Secondary | ICD-10-CM | POA: Diagnosis not present

## 2020-08-25 ENCOUNTER — Ambulatory Visit (INDEPENDENT_AMBULATORY_CARE_PROVIDER_SITE_OTHER): Payer: Medicare Other | Admitting: Cardiology

## 2020-08-25 ENCOUNTER — Encounter: Payer: Self-pay | Admitting: Cardiology

## 2020-08-25 VITALS — BP 138/92 | HR 96 | Ht 66.0 in | Wt 244.0 lb

## 2020-08-25 DIAGNOSIS — I471 Supraventricular tachycardia: Secondary | ICD-10-CM

## 2020-08-25 DIAGNOSIS — I10 Essential (primary) hypertension: Secondary | ICD-10-CM

## 2020-08-25 NOTE — Progress Notes (Signed)
Cardiology Office Note  Date: 08/25/2020   ID: [name], DOB 11-11-1950, MRN [id number]  PCP:  Benita Stabile, MD  Cardiologist:  Nona Dell, MD Electrophysiologist:  None   Chief Complaint  Patient presents with  . Cardiac follow-up    History of Present Illness: [name] is a 70 y.o. female last assessed via telehealth encounter in January 2021 by [name].  She presents for a routine visit.  Describes an episode of very brief palpitations that she noted recently, but otherwise has not had any prolonged events, no syncope.  She continues on Toprol-XL 50 mg twice daily with history of PSVT.  I personally reviewed her ECG today which shows sinus rhythm with right bundle branch block and left anterior fascicular block.  Echocardiogram from 2019 revealed LVEF 55 to 60%.  No significant valvular abnormalities.  Past Medical History:  Diagnosis Date  . Anxiety   . Arthritis   . Borderline hypertension   . Chronic lower back pain   . Chronic shoulder pain   . Depression   . Fe deficiency anemia   . GERD (gastroesophageal reflux disease)   . Heartburn   . Hiatal hernia   . History of cardiac catheterization    Normal coronaries  . History of mixed drug abuse (HCC)   . Hyperlipidemia   . Hypertension   . Hypothyroidism    [name]   . Lumbar herniated disc    L4-L5   . Microscopic hematuria   . Obesity   . Personal history of venous thrombosis and embolism   . PSVT (paroxysmal supraventricular tachycardia) (HCC) 8/05  . Recurrent cystitis    Dr. Aldean Ast     Past Surgical History:  Procedure Laterality Date  . APPENDECTOMY  2004?or 11/03?  [name] BACK SURGERY    . BLADDER REPAIR    . CARPAL TUNNEL RELEASE     x2   . HARDWARE REMOVAL Right 02/18/2016   Procedure: HARDWARE REMOVAL;  Surgeon: Vickki Hearing, MD;  Location: [facility];  Service: Orthopedics;  Laterality: Right;  right ankle medial malleolar screw  . HARDWARE REMOVAL Right 03/08/2016    Procedure: HARDWARE REMOVAL AND INTERNAL FIXATION OF RIGHT MEDIAL MALLEOLUS  - RIGHT ANKLE REMOVAL OF MALLEOLAR SCREW, INSERTION OF 2  0.45 K WIRES;  Surgeon: Vickki Hearing, MD;  Location: [facility];  Service: Orthopedics;  Laterality: Right;  RIGHT ANKLE REMOVAL OF MALLEOLAR SCREW  . LIVER BIOPSY  2002   Dr. Katrinka Blazing   . ORIF ANKLE FRACTURE Right 02/02/2016   Procedure: OPEN REDUCTION INTERNAL FIXATION (ORIF) ANKLE FRACTURE;  Surgeon: Vickki Hearing, MD;  Location: [facility];  Service: Orthopedics;  Laterality: Right;  . ORIF ANKLE FRACTURE Right 02/18/2016   Procedure: OPEN REDUCTION INTERNAL FIXATION (ORIF) ANKLE FRACTURE;  Surgeon: Vickki Hearing, MD;  Location: [facility];  Service: Orthopedics;  Laterality: Right;  reinsertion of medial malleolar screw  . PARTIAL HYSTERECTOMY  2004   One ovary   . RECTAL PROLAPSE REPAIR    . RECTOCELE REPAIR  05/2001  . Right knee arthroscopy  8/05  . Right knee replacement  8/05  . SHOULDER SURGERY      Current Outpatient Medications  Medication Sig Dispense Refill  . albuterol (VENTOLIN HFA) 108 (90 Base) MCG/ACT inhaler Inhale into the lungs every 6 (six) hours as needed for wheezing or shortness of breath.    . AMITIZA 24 MCG capsule TAKE ONE CAPSULE BY MOUTH TWICE A DAY  60 capsule 5  . clonazePAM (KLONOPIN) 1 MG tablet Take 1 mg by mouth 3 (three) times daily as needed.    . cyclobenzaprine (FLEXERIL) 10 MG tablet TAKE ONE TABLET BY MOUTH THREE TIMES DAILY AS NEEDED FOR MUSCLE SPASM 90 tablet 5  . escitalopram (LEXAPRO) 10 MG tablet Take 10 mg by mouth daily.    [name] estradiol (ESTRACE) 0.5 MG tablet Take 1 tablet by mouth daily.    [name] ezetimibe (ZETIA) 10 MG tablet Take 10 mg by mouth daily.    . furosemide (LASIX) 20 MG tablet TAKE ONE (1) TABLET EACH DAY 30 tablet 2  . levothyroxine (SYNTHROID, LEVOTHROID) 75 MCG tablet TAKE ONE (1) TABLET EACH DAY 90 tablet 3  . LINZESS 290 MCG CAPS capsule Take 290 mcg by mouth daily.    . metoprolol succinate  (TOPROL-XL) 50 MG 24 hr tablet TAKE ONE TABLET BY MOUTH TWICE DAILY 180 tablet 0  . nitrofurantoin, macrocrystal-monohydrate, (MACROBID) 100 MG capsule Take 1 capsule (100 mg total) by mouth every 12 (twelve) hours. 14 capsule 0  . Omega-3 Fatty Acids (FISH OIL) 1200 MG CAPS Take 1 capsule by mouth 2 (two) times daily.    [name] omeprazole (PRILOSEC) 20 MG capsule TAKE ONE CAPSULE BY MOUTH TWICE A DAY 60 capsule 5  . potassium chloride (K-DUR) 10 MEQ tablet TAKE FIVE TABLETS TWICE DAILY 300 tablet 2  . RESTASIS 0.05 % ophthalmic emulsion INSTILL ONE DROP IN BOTH EYES TWICE DAILY 60 each 11  . traZODone (DESYREL) 50 MG tablet Take 50 mg by mouth at bedtime.    [name] VASCEPA 1 g capsule Take 2 g by mouth 2 (two) times daily.    [name] venlafaxine XR (EFFEXOR-XR) 75 MG 24 hr capsule TAKE 3 CAPSULES EVERY MORNING 90 capsule 1   No current facility-administered medications for this visit.   Allergies:  Clindamycin, Diphenhydramine hcl, and Naproxen   ROS: No syncope.  Physical Exam: VS:  BP (!) 138/92   Pulse 96   Ht 5\' 6"  (1.676 m)   Wt 244 lb (110.7 kg)   SpO2 94%   BMI 39.38 kg/m , BMI Body mass index is 39.38 kg/m.  Wt Readings from Last 3 Encounters:  08/25/20 244 lb (110.7 kg)  06/26/19 235 lb (106.6 kg)  05/03/18 241 lb 9.6 oz (109.6 kg)    General: Patient appears comfortable at rest. HEENT: Conjunctiva and lids normal, wearing a mask. Neck: Supple, no elevated JVP or carotid bruits, no thyromegaly. Lungs: Clear to auscultation, nonlabored breathing at rest. Cardiac: Regular rate and rhythm, no S3 or significant systolic murmur, no pericardial rub. Extremities: No pitting edema.  ECG:  An ECG dated 05/03/2018 was personally reviewed today and demonstrated:  Sinus rhythm with right bundle branch block.  Recent Labwork:  September 2020: Hemoglobin 13.8, platelets 216, BUN 14, creatinine 1.0, potassium 4.5, AST 21, ALT 20, cholesterol 178, triglycerides 175, HDL 62, LDL 86  Other  Studies Reviewed Today:  Echocardiogram 05/07/2018: - Left ventricle: The cavity size was normal. Wall thickness was  normal. Systolic function was normal. The estimated ejection  fraction was in the range of 55% to 60%. Wall motion was normal;  there were no regional wall motion abnormalities. Left  ventricular diastolic function parameters were normal.  - Atrial septum: No defect or patent foramen ovale was identified.   Assessment and Plan:  1.  History of PSVT.  No prolonged or progressive symptoms.  She is tolerating Toprol-XL at 50 mg twice daily.  ECG reviewed.  No changes were made today.  2.  Essential hypertension, systolic is in the 130s today with diastolic in the 90s.  She is following with Dr. Margo Aye.  Medication Adjustments/Labs and Tests Ordered: Current medicines are reviewed at length with the patient today.  Concerns regarding medicines are outlined above.   Tests Ordered: Orders Placed This Encounter  Procedures  . EKG 12-Lead    Medication Changes: No orders of the defined types were placed in this encounter.   Disposition:  Follow up 1 year in the [place] office.  Signed, Jonelle Sidle, MD, [facility] 08/25/2020 1:14 PM    [facility] [address] [place], [place], [place] [address] Phone: [phone]; Fax: [phone]

## 2020-08-25 NOTE — Patient Instructions (Signed)

## 2020-08-26 ENCOUNTER — Ambulatory Visit (HOSPITAL_COMMUNITY)
Admission: RE | Admit: 2020-08-26 | Discharge: 2020-08-26 | Disposition: A | Discharge status: Home / Self Care | Payer: Medicare Other | Source: Ambulatory Visit | Attending: Urology | Admitting: Urology

## 2020-08-26 ENCOUNTER — Other Ambulatory Visit: Payer: Self-pay

## 2020-08-26 DIAGNOSIS — N390 Urinary tract infection, site not specified: Secondary | ICD-10-CM | POA: Diagnosis not present

## 2020-08-26 DIAGNOSIS — R3129 Other microscopic hematuria: Secondary | ICD-10-CM | POA: Insufficient documentation

## 2020-08-30 ENCOUNTER — Telehealth: Payer: Self-pay | Admitting: Urology

## 2020-08-30 NOTE — Telephone Encounter (Signed)
Patient asking for recent u/s results

## 2020-08-30 NOTE — Telephone Encounter (Signed)
Pt called wanting to get the results of her recent ultrasound she had done. Please call her when you can. Thanks!

## 2020-09-02 NOTE — Telephone Encounter (Signed)
Patient notified of results and appointment.

## 2020-09-13 ENCOUNTER — Other Ambulatory Visit: Payer: Medicare Other | Admitting: Urology

## 2020-09-13 DIAGNOSIS — N390 Urinary tract infection, site not specified: Secondary | ICD-10-CM

## 2020-10-11 ENCOUNTER — Other Ambulatory Visit: Payer: Medicare Other | Admitting: Urology

## 2020-10-11 DIAGNOSIS — N390 Urinary tract infection, site not specified: Secondary | ICD-10-CM

## 2020-11-02 DIAGNOSIS — I129 Hypertensive chronic kidney disease with stage 1 through stage 4 chronic kidney disease, or unspecified chronic kidney disease: Secondary | ICD-10-CM | POA: Diagnosis not present

## 2020-11-03 DIAGNOSIS — I1310 Hypertensive heart and chronic kidney disease without heart failure, with stage 1 through stage 4 chronic kidney disease, or unspecified chronic kidney disease: Secondary | ICD-10-CM | POA: Diagnosis not present

## 2020-11-03 DIAGNOSIS — F329 Major depressive disorder, single episode, unspecified: Secondary | ICD-10-CM | POA: Diagnosis not present

## 2020-11-03 DIAGNOSIS — E039 Hypothyroidism, unspecified: Secondary | ICD-10-CM | POA: Diagnosis not present

## 2020-11-03 DIAGNOSIS — F411 Generalized anxiety disorder: Secondary | ICD-10-CM | POA: Diagnosis not present

## 2020-11-03 DIAGNOSIS — R7303 Prediabetes: Secondary | ICD-10-CM | POA: Diagnosis not present

## 2020-11-03 DIAGNOSIS — G4700 Insomnia, unspecified: Secondary | ICD-10-CM | POA: Diagnosis not present

## 2020-11-03 DIAGNOSIS — E782 Mixed hyperlipidemia: Secondary | ICD-10-CM | POA: Diagnosis not present

## 2020-11-15 ENCOUNTER — Other Ambulatory Visit: Payer: Medicare Other | Admitting: Urology

## 2020-12-27 ENCOUNTER — Other Ambulatory Visit: Payer: Self-pay

## 2020-12-27 ENCOUNTER — Ambulatory Visit (INDEPENDENT_AMBULATORY_CARE_PROVIDER_SITE_OTHER): Payer: Medicare Other | Admitting: Urology

## 2020-12-27 VITALS — BP 138/73 | HR 91

## 2020-12-27 DIAGNOSIS — N390 Urinary tract infection, site not specified: Secondary | ICD-10-CM

## 2020-12-27 DIAGNOSIS — R319 Hematuria, unspecified: Secondary | ICD-10-CM | POA: Diagnosis not present

## 2020-12-27 DIAGNOSIS — R3129 Other microscopic hematuria: Secondary | ICD-10-CM

## 2020-12-27 LAB — MICROSCOPIC EXAMINATION: Renal Epithel, UA: NONE SEEN /hpf

## 2020-12-27 LAB — URINALYSIS, ROUTINE W REFLEX MICROSCOPIC
Bilirubin, UA: NEGATIVE
Glucose, UA: NEGATIVE
Nitrite, UA: POSITIVE — AB
Protein,UA: NEGATIVE
Specific Gravity, UA: >1.03 — ABNORMAL HIGH (ref 1.005–1.030)
Urobilinogen, Ur: 0.2 mg/dL (ref 0.2–1.0)
pH, UA: 5.5 (ref 5.0–7.5)

## 2020-12-27 MED ORDER — CIPROFLOXACIN HCL 500 MG PO TABS
500.0000 mg | ORAL_TABLET | Freq: Once | ORAL | Status: AC
  Administered 2020-12-27: 500 mg via ORAL

## 2020-12-27 MED ORDER — NITROFURANTOIN MACROCRYSTAL 50 MG PO CAPS: 50.0000 mg | ORAL_CAPSULE | Freq: Every day | ORAL | 0 refills | Status: DC

## 2020-12-27 MED ORDER — DOXYCYCLINE MONOHYDRATE 100 MG PO CAPS: 100.0000 mg | ORAL_CAPSULE | Freq: Two times a day (BID) | ORAL | 0 refills | Status: DC

## 2020-12-27 NOTE — Progress Notes (Signed)
   12/27/20  CC: microhematuria  HPI:  F/u -   1) bacteriuria - she has an "odor" of the urine. No cloudiness. No dysuria or gross hematuria. She has no NG risk. I saw her urine culture from 2018 and 2020 + for E. coli but no recent cultures. She has had "2-3 bladder tacks" and a partial hysterectomy. She is on hormone replacement therapy with estradiol daily. 02/22 urine cx e coli R to FQ and Bactrim.    2) microhematuria - UA 02/22 with 3-10 rbcs and many bacteria. Again, no dysuria or bladder pain. Renal US 03/22 benign.    Non-smoker. No other exposures.   Here for cystoscopy. Has typical urine odor but no dysuria, bladder pain or fever. UA with 3-10 rbc and many bacteria.    Blood pressure 138/73, pulse 91. NED. A&Ox3.   No respiratory distress   Abd soft, NT, ND Normal external genitalia with patent urethral meatus  Chaperone: Hope   Cystoscopy Procedure Note  Patient identification was confirmed, informed consent was obtained, and patient was prepped using Betadine solution.  Lidocaine jelly was administered per urethral meatus.    Procedure: - Flexible cystoscope introduced, without any difficulty.   - Thorough search of the bladder revealed:    normal urethral meatus    normal urothelium but urine very cloudy and hard to get a good visual    no stones    no ulcers - possibly some areas of erythema     no tumors    no urethral polyps    no trabeculation  - Ureteral orifices were normal in position and appearance.  Post-Procedure: - Patient tolerated the procedure well  Assessment/ Plan:  Bacteriuria - no . I sent a dose of doxy to pharmacy to cover for the cysto and will start a nightly NF 50 mg and repeat cysto in about 6 weeks   MH - benign eval so far    No follow-ups on file.  Jerilee Field, MD

## 2020-12-27 NOTE — Progress Notes (Signed)
post void residual=0  Urological Symptom Review  Patient is experiencing the following symptoms: Frequent urination Hard to postpone urination Get up at night to urinate Urinary tract infection Foul smelling urine   Review of Systems  Gastrointestinal (upper)  : Negative for upper GI symptoms  Gastrointestinal (lower) : Negative for lower GI symptoms  Constitutional : Fatigue  Skin: Negative for skin symptoms  Eyes: Negative for eye symptoms  Ear/Nose/Throat : Negative for Ear/Nose/Throat symptoms  Hematologic/Lymphatic: Negative for Hematologic/Lymphatic symptoms  Cardiovascular : Leg swelling  Respiratory : Shortness of breath  Endocrine: Excessive thirst  Musculoskeletal: Back pain Joint pain  Neurological: Negative for neurological symptoms  Psychologic: Depression Anxiety

## 2021-02-11 DIAGNOSIS — E782 Mixed hyperlipidemia: Secondary | ICD-10-CM | POA: Diagnosis not present

## 2021-02-16 DIAGNOSIS — E782 Mixed hyperlipidemia: Secondary | ICD-10-CM | POA: Diagnosis not present

## 2021-02-16 DIAGNOSIS — Z13820 Encounter for screening for osteoporosis: Secondary | ICD-10-CM | POA: Diagnosis not present

## 2021-02-16 DIAGNOSIS — I1310 Hypertensive heart and chronic kidney disease without heart failure, with stage 1 through stage 4 chronic kidney disease, or unspecified chronic kidney disease: Secondary | ICD-10-CM | POA: Diagnosis not present

## 2021-02-16 DIAGNOSIS — E039 Hypothyroidism, unspecified: Secondary | ICD-10-CM | POA: Diagnosis not present

## 2021-02-17 ENCOUNTER — Other Ambulatory Visit (HOSPITAL_COMMUNITY): Payer: Self-pay | Admitting: Family Medicine

## 2021-02-17 DIAGNOSIS — Z13820 Encounter for screening for osteoporosis: Secondary | ICD-10-CM

## 2021-02-17 DIAGNOSIS — M810 Age-related osteoporosis without current pathological fracture: Secondary | ICD-10-CM

## 2021-02-17 DIAGNOSIS — M8580 Other specified disorders of bone density and structure, unspecified site: Secondary | ICD-10-CM

## 2021-02-28 ENCOUNTER — Other Ambulatory Visit: Payer: Medicare Other | Admitting: Urology

## 2021-03-14 ENCOUNTER — Other Ambulatory Visit: Payer: Medicare Other | Admitting: Urology

## 2021-03-14 DIAGNOSIS — N390 Urinary tract infection, site not specified: Secondary | ICD-10-CM

## 2021-03-29 ENCOUNTER — Telehealth: Payer: Self-pay

## 2021-03-29 DIAGNOSIS — N390 Urinary tract infection, site not specified: Secondary | ICD-10-CM

## 2021-03-29 NOTE — Telephone Encounter (Signed)
Patient called this am with UTI symptoms. Patient will come by in am (first time for patient can get a ride) and will leave urine for sample and testing. Patient voiced understanding.

## 2021-03-30 ENCOUNTER — Other Ambulatory Visit: Payer: Medicare Other

## 2021-03-31 ENCOUNTER — Other Ambulatory Visit: Payer: Self-pay

## 2021-03-31 ENCOUNTER — Other Ambulatory Visit: Payer: Medicare Other

## 2021-03-31 DIAGNOSIS — N390 Urinary tract infection, site not specified: Secondary | ICD-10-CM | POA: Diagnosis not present

## 2021-03-31 DIAGNOSIS — R319 Hematuria, unspecified: Secondary | ICD-10-CM | POA: Diagnosis not present

## 2021-03-31 LAB — URINALYSIS, ROUTINE W REFLEX MICROSCOPIC
Bilirubin, UA: NEGATIVE
Glucose, UA: NEGATIVE
Nitrite, UA: POSITIVE — AB
Specific Gravity, UA: >1.03 — ABNORMAL HIGH (ref 1.005–1.030)
Urobilinogen, Ur: 0.2 mg/dL (ref 0.2–1.0)
pH, UA: 5.5 (ref 5.0–7.5)

## 2021-04-04 ENCOUNTER — Telehealth: Payer: Self-pay | Admitting: Urology

## 2021-04-04 ENCOUNTER — Other Ambulatory Visit: Payer: Self-pay

## 2021-04-04 LAB — URINE CULTURE

## 2021-04-04 MED ORDER — DOXYCYCLINE HYCLATE 100 MG PO CAPS: 100.0000 mg | ORAL_CAPSULE | Freq: Two times a day (BID) | ORAL | 0 refills | Status: DC

## 2021-04-04 NOTE — Telephone Encounter (Signed)
-----   Message from [facility], LPN sent at [id number]/58/8325  2:30 PM EDT ----- Patient states she got the nitrofurantoin filled last on 8/18 and did not have anymore. She is currently not taking.  ----- Message ----- From: Jerilee Field, MD Sent: 04/01/2021   7:35 AM EDT To: Gustavus Messing, LPN  Her urine looks like she could have a UTI although I did not see the microscopy. Is she still taking nightly nitrofurantoin or did that run out? It's still on her med list. Thanks.   ----- Message ----- From: Gustavus Messing, LPN Sent: [id number]   5:11 PM EDT To: Jerilee Field, MD  Urine drop off due to symptoms Culture pending

## 2021-04-04 NOTE — Telephone Encounter (Signed)
I sent in abx twice a day for five days - thanks

## 2021-04-04 NOTE — Telephone Encounter (Signed)
Na

## 2021-04-06 ENCOUNTER — Telehealth: Payer: Self-pay

## 2021-04-06 NOTE — Telephone Encounter (Signed)
-----   Message from Malen Gauze, MD sent at 04/06/2021  8:27 AM EDT ----- Macrobid 100mg  bid for 7 days ----- Message ----- From: , CMA Sent: 04/04/2021   8:44 AM EDT To: 04/06/2021, MD  Please review

## 2021-04-06 NOTE — Telephone Encounter (Signed)
Called and notifed pt of  her urine culture  results advised pt that  new antibiotic was sent in the pharmacy for her .

## 2021-04-11 ENCOUNTER — Inpatient Hospital Stay (HOSPITAL_COMMUNITY): Admission: RE | Admit: 2021-04-11 | Payer: Medicare Other | Source: Ambulatory Visit

## 2021-04-12 ENCOUNTER — Other Ambulatory Visit (HOSPITAL_COMMUNITY): Payer: Medicare Other

## 2021-04-12 DIAGNOSIS — Z1152 Encounter for screening for COVID-19: Secondary | ICD-10-CM | POA: Diagnosis not present

## 2021-04-18 ENCOUNTER — Other Ambulatory Visit (HOSPITAL_COMMUNITY): Payer: Self-pay | Admitting: Internal Medicine

## 2021-04-18 ENCOUNTER — Other Ambulatory Visit (HOSPITAL_COMMUNITY): Payer: Medicare Other

## 2021-04-18 ENCOUNTER — Other Ambulatory Visit: Payer: Medicare Other | Admitting: Urology

## 2021-04-18 DIAGNOSIS — Z1231 Encounter for screening mammogram for malignant neoplasm of breast: Secondary | ICD-10-CM

## 2021-05-13 DIAGNOSIS — Z1152 Encounter for screening for COVID-19: Secondary | ICD-10-CM | POA: Diagnosis not present

## 2021-05-16 ENCOUNTER — Ambulatory Visit (HOSPITAL_COMMUNITY): Payer: Medicare Other

## 2021-05-16 ENCOUNTER — Other Ambulatory Visit: Payer: Medicare Other | Admitting: Urology

## 2021-05-16 ENCOUNTER — Other Ambulatory Visit (HOSPITAL_COMMUNITY): Payer: Medicare Other

## 2021-05-16 DIAGNOSIS — N390 Urinary tract infection, site not specified: Secondary | ICD-10-CM

## 2021-06-30 ENCOUNTER — Other Ambulatory Visit (HOSPITAL_COMMUNITY): Payer: Medicare Other

## 2021-06-30 ENCOUNTER — Ambulatory Visit (HOSPITAL_COMMUNITY): Payer: Medicare Other

## 2021-07-04 ENCOUNTER — Ambulatory Visit (HOSPITAL_COMMUNITY)
Admission: RE | Admit: 2021-07-04 | Discharge: 2021-07-04 | Disposition: A | Discharge status: Home / Self Care | Payer: Medicare Other | Source: Ambulatory Visit | Attending: Internal Medicine | Admitting: Internal Medicine

## 2021-07-04 ENCOUNTER — Ambulatory Visit (HOSPITAL_COMMUNITY)
Admission: RE | Admit: 2021-07-04 | Discharge: 2021-07-04 | Disposition: A | Discharge status: Home / Self Care | Payer: Medicare Other | Source: Ambulatory Visit | Attending: Family Medicine | Admitting: Family Medicine

## 2021-07-04 ENCOUNTER — Other Ambulatory Visit: Payer: Self-pay

## 2021-07-04 DIAGNOSIS — Z780 Asymptomatic menopausal state: Secondary | ICD-10-CM | POA: Diagnosis not present

## 2021-07-04 DIAGNOSIS — Z23 Encounter for immunization: Secondary | ICD-10-CM | POA: Diagnosis not present

## 2021-07-04 DIAGNOSIS — M810 Age-related osteoporosis without current pathological fracture: Secondary | ICD-10-CM

## 2021-07-04 DIAGNOSIS — Z1231 Encounter for screening mammogram for malignant neoplasm of breast: Secondary | ICD-10-CM | POA: Diagnosis not present

## 2021-07-04 DIAGNOSIS — M85851 Other specified disorders of bone density and structure, right thigh: Secondary | ICD-10-CM | POA: Diagnosis not present

## 2021-07-29 ENCOUNTER — Other Ambulatory Visit: Payer: Self-pay | Admitting: Urology

## 2021-07-29 MED ORDER — CEPHALEXIN 500 MG PO CAPS: 500.0000 mg | ORAL_CAPSULE | Freq: Two times a day (BID) | ORAL | 0 refills | Status: DC

## 2021-08-01 ENCOUNTER — Other Ambulatory Visit: Payer: Medicare Other | Admitting: Urology

## 2021-08-16 DIAGNOSIS — E039 Hypothyroidism, unspecified: Secondary | ICD-10-CM | POA: Diagnosis not present

## 2021-08-16 DIAGNOSIS — R7303 Prediabetes: Secondary | ICD-10-CM | POA: Diagnosis not present

## 2021-08-16 DIAGNOSIS — I1310 Hypertensive heart and chronic kidney disease without heart failure, with stage 1 through stage 4 chronic kidney disease, or unspecified chronic kidney disease: Secondary | ICD-10-CM | POA: Diagnosis not present

## 2021-08-29 ENCOUNTER — Other Ambulatory Visit: Payer: Medicare Other | Admitting: Urology

## 2021-09-08 DIAGNOSIS — R32 Unspecified urinary incontinence: Secondary | ICD-10-CM | POA: Diagnosis not present

## 2021-09-08 DIAGNOSIS — I1310 Hypertensive heart and chronic kidney disease without heart failure, with stage 1 through stage 4 chronic kidney disease, or unspecified chronic kidney disease: Secondary | ICD-10-CM | POA: Diagnosis not present

## 2021-09-08 DIAGNOSIS — R5383 Other fatigue: Secondary | ICD-10-CM | POA: Diagnosis not present

## 2021-09-08 DIAGNOSIS — I129 Hypertensive chronic kidney disease with stage 1 through stage 4 chronic kidney disease, or unspecified chronic kidney disease: Secondary | ICD-10-CM | POA: Diagnosis not present

## 2021-09-08 DIAGNOSIS — E039 Hypothyroidism, unspecified: Secondary | ICD-10-CM | POA: Diagnosis not present

## 2021-09-08 DIAGNOSIS — R7303 Prediabetes: Secondary | ICD-10-CM | POA: Diagnosis not present

## 2021-09-08 DIAGNOSIS — F329 Major depressive disorder, single episode, unspecified: Secondary | ICD-10-CM | POA: Diagnosis not present

## 2021-09-08 DIAGNOSIS — E782 Mixed hyperlipidemia: Secondary | ICD-10-CM | POA: Diagnosis not present

## 2021-09-08 DIAGNOSIS — M8580 Other specified disorders of bone density and structure, unspecified site: Secondary | ICD-10-CM | POA: Diagnosis not present

## 2021-09-08 DIAGNOSIS — M19079 Primary osteoarthritis, unspecified ankle and foot: Secondary | ICD-10-CM | POA: Diagnosis not present

## 2021-09-08 DIAGNOSIS — M7989 Other specified soft tissue disorders: Secondary | ICD-10-CM | POA: Diagnosis not present

## 2021-09-08 DIAGNOSIS — Z79899 Other long term (current) drug therapy: Secondary | ICD-10-CM | POA: Diagnosis not present

## 2021-10-10 ENCOUNTER — Other Ambulatory Visit: Payer: Medicare Other | Admitting: Urology

## 2021-10-24 ENCOUNTER — Ambulatory Visit (INDEPENDENT_AMBULATORY_CARE_PROVIDER_SITE_OTHER): Payer: Medicare Other | Admitting: Urology

## 2021-10-24 VITALS — BP 98/66 | HR 98

## 2021-10-24 DIAGNOSIS — R3129 Other microscopic hematuria: Secondary | ICD-10-CM

## 2021-10-24 DIAGNOSIS — N390 Urinary tract infection, site not specified: Secondary | ICD-10-CM

## 2021-10-24 LAB — URINALYSIS, ROUTINE W REFLEX MICROSCOPIC
Bilirubin, UA: NEGATIVE
Glucose, UA: NEGATIVE
Ketones, UA: NEGATIVE
Nitrite, UA: NEGATIVE
Protein,UA: NEGATIVE
RBC, UA: NEGATIVE
Specific Gravity, UA: >1.03 — ABNORMAL HIGH (ref 1.005–1.030)
Urobilinogen, Ur: 0.2 mg/dL (ref 0.2–1.0)
pH, UA: 5.5 (ref 5.0–7.5)

## 2021-10-24 LAB — MICROSCOPIC EXAMINATION
RBC, Urine: NONE SEEN /hpf (ref 0–2)
Renal Epithel, UA: NONE SEEN /hpf

## 2021-10-24 MED ORDER — CIPROFLOXACIN HCL 500 MG PO TABS
500.0000 mg | ORAL_TABLET | Freq: Once | ORAL | Status: AC
  Administered 2021-10-24: 500 mg via ORAL

## 2021-10-24 NOTE — Progress Notes (Signed)
? ?  10/24/21 ? ?CC: No chief complaint on file. ? ? ?HPI: ? ?F/u -  ? ?1) bacteriuria - she had an "odor" of the urine. No cloudiness. No dysuria or gross hematuria. She has no NG risk. I saw her urine culture from 2018 and 2020 + for E. coli but no recent cultures. She has had "2-3 bladder tacks" and a partial hysterectomy. She is on hormone replacement therapy with estradiol daily. 02/22 urine cx e coli R to FQ and Bactrim.   ?  ?2) microhematuria - UA 02/22 with 3-10 rbcs and many bacteria. Again, no dysuria or bladder pain. Renal US 03/22 benign.  July 2022 cystoscopy with some bladder erythema and poor visualization. ?  ?Non-smoker. No other exposures.  ?  ?She returns.  She was seen in July 2022 when there was some bladder erythema and poor visualization.  She was put on antibiotics and to follow-up in about 6 weeks for repeat cystoscopy.  She returns today.  Her urine looks much better.  Few bacteria, 6-10 whites.  No rbcs. No gross hematuria or dysuria. ? ?Blood pressure 98/66, pulse 98. ?NED. A&Ox3.   ?No respiratory distress   ?Abd soft, NT, ND ?Normal external genitalia with patent urethral meatus ? ?Hope - chaperone exam and cystoscopy  ? ?Cystoscopy Procedure Note ? ?Patient identification was confirmed, informed consent was obtained, and patient was prepped using Betadine solution.  Lidocaine jelly was administered per urethral meatus.   ? ?Procedure: ?- Flexible cystoscope introduced, without any difficulty.   ?- Thorough search of the bladder revealed: ?   normal urethral meatus ?   normal urothelium ?   no stones ?   no ulcers  ?   no tumors ?   no urethral polyps ?   no trabeculation ? ?- Ureteral orifices were normal in position and appearance. ? ?Post-Procedure: ?- Patient tolerated the procedure well ? ?Assessment/ Plan: ? ?MH - benign eval - bladder looks good today.  ? ? ? ? No follow-ups on file. ? ?Jerilee Field, MD  ?

## 2021-11-01 ENCOUNTER — Telehealth: Payer: Self-pay

## 2021-11-01 NOTE — Telephone Encounter (Signed)
Pt left a voice message 10-31-21:  After last weeks visit.  Has been hurting and having frequent urination.  Wants to know if something can be called into pharmacy.  Please advise.  Call back:  [phone]   Thanks, Rosey Bath

## 2021-11-01 NOTE — Telephone Encounter (Signed)
Please advise, tried to call and offer patient an apt with Noreene Larsson but no answer and no voicemail.

## 2021-11-06 ENCOUNTER — Encounter: Payer: Self-pay | Admitting: Emergency Medicine

## 2021-11-06 ENCOUNTER — Ambulatory Visit
Admission: EM | Admit: 2021-11-06 | Discharge: 2021-11-06 | Disposition: A | Discharge status: Home / Self Care | Payer: Medicare Other | Attending: Nurse Practitioner | Admitting: Nurse Practitioner

## 2021-11-06 DIAGNOSIS — N3000 Acute cystitis without hematuria: Secondary | ICD-10-CM | POA: Insufficient documentation

## 2021-11-06 LAB — POCT URINALYSIS DIP (MANUAL ENTRY)
Bilirubin, UA: NEGATIVE
Glucose, UA: NEGATIVE mg/dL
Ketones, POC UA: NEGATIVE mg/dL
Nitrite, UA: POSITIVE — AB
Protein Ur, POC: 30 mg/dL — AB
Spec Grav, UA: >=1.03 — AB (ref 1.010–1.025)
Urobilinogen, UA: 0.2 E.U./dL
pH, UA: 5 (ref 5.0–8.0)

## 2021-11-06 MED ORDER — NITROFURANTOIN MONOHYD MACRO 100 MG PO CAPS: 100.0000 mg | ORAL_CAPSULE | Freq: Two times a day (BID) | ORAL | 0 refills | Status: DC

## 2021-11-06 NOTE — ED Provider Notes (Signed)
[facility]    CSN: [id number] Arrival date & time: 11/06/21  1343      History   Chief Complaint No chief complaint on file.   HPI [name] is a 71 y.o. female.   HPI Patient presents with urinary frequency and dysuria since 10/26/2021.  Patient states she saw urology on 10/24/2021 and they "went up in her bladder".  She states that during that visit she was given a tablet of Cipro.  She states that she was told that everything looked fine and that she did not have a urinary tract infection.  Patient was seen urology for frequent UTI and hematuria.  Patient denies fever, chills, abdominal pain, flank pain, hematuria, or foul-smelling urine.  Past Medical History:  Diagnosis Date   Anxiety    Arthritis    Borderline hypertension    Chronic lower back pain    Chronic shoulder pain    Depression    Fe deficiency anemia    GERD (gastroesophageal reflux disease)    Heartburn    Hiatal hernia    History of cardiac catheterization    Normal coronaries   History of mixed drug abuse ([place])    Hyperlipidemia    Hypertension    Hypothyroidism    Dr. Derrill Memo    Lumbar herniated disc    L4-L5    Microscopic hematuria    Obesity    Personal history of venous thrombosis and embolism    PSVT (paroxysmal supraventricular tachycardia) ([place]) 8/05   Recurrent cystitis    Dr. Serita Butcher     Patient Active Problem List   Diagnosis Date Noted   S/P ORIF (open reduction internal fixation) fracture right ankle 02/02/16 removal hardware 03/08/16 08/30/2017   Post-menopause 07/05/2016   Benzodiazepine dependence ([place]) 05/07/2016   Failed orthopedic implant ([place])    Fracture of ankle, trimalleolar, right, closed    Depression 06/29/2015   GAD (generalized anxiety disorder) 06/29/2015   LVH (left ventricular hypertrophy) 03/24/2015   Arthritis 03/24/2015   Hypothyroidism    Vitamin D deficiency 01/02/2013   Fatigue 10/03/2012   Obesity 10/03/2012   PSVT (paroxysmal  supraventricular tachycardia) ([place]) 02/05/2012   Right bundle branch block 02/03/2011   Essential hypertension, benign 03/02/2009    Past Surgical History:  Procedure Laterality Date   APPENDECTOMY  2004?or 11/03?   BACK SURGERY     BLADDER REPAIR     CARPAL TUNNEL RELEASE     x2    HARDWARE REMOVAL Right 02/18/2016   Procedure: HARDWARE REMOVAL;  Surgeon: Carole Civil, MD;  Location: [facility];  Service: Orthopedics;  Laterality: Right;  right ankle medial malleolar screw   HARDWARE REMOVAL Right 03/08/2016   Procedure: HARDWARE REMOVAL AND INTERNAL FIXATION OF RIGHT MEDIAL MALLEOLUS  - RIGHT ANKLE REMOVAL OF MALLEOLAR SCREW, INSERTION OF 2  0.45 K WIRES;  Surgeon: Carole Civil, MD;  Location: [facility];  Service: Orthopedics;  Laterality: Right;  RIGHT ANKLE REMOVAL OF MALLEOLAR SCREW   LIVER BIOPSY  2002   Dr. Tamala Julian    ORIF ANKLE FRACTURE Right 02/02/2016   Procedure: OPEN REDUCTION INTERNAL FIXATION (ORIF) ANKLE FRACTURE;  Surgeon: Carole Civil, MD;  Location: [facility];  Service: Orthopedics;  Laterality: Right;   ORIF ANKLE FRACTURE Right 02/18/2016   Procedure: OPEN REDUCTION INTERNAL FIXATION (ORIF) ANKLE FRACTURE;  Surgeon: Carole Civil, MD;  Location: [facility];  Service: Orthopedics;  Laterality: Right;  reinsertion of medial malleolar screw   PARTIAL HYSTERECTOMY  2004   One ovary    RECTAL PROLAPSE REPAIR     RECTOCELE REPAIR  05/2001   Right knee arthroscopy  8/05   Right knee replacement  8/05   SHOULDER SURGERY      OB History     Gravida  6   Para  5   Term  5   Preterm  0   AB  1   Living  5      SAB  1   IAB  0   Ectopic  0   Multiple  0   Live Births               Home Medications    Prior to Admission medications   Medication Sig Start Date End Date Taking? Authorizing Provider  cephALEXin (KEFLEX) 500 MG capsule Take 1 capsule (500 mg total) by mouth 2 (two) times daily. Start now in preparation for your cystoscopy on  Monday (a quick look in the bladder) Patient not taking: Reported on 10/24/2021 07/29/21   Festus Aloe, MD  nitrofurantoin, macrocrystal-monohydrate, (MACROBID) 100 MG capsule Take 1 capsule (100 mg total) by mouth 2 (two) times daily. 11/06/21  Yes Brithney Bensen-Warren, Alda Lea, NP  albuterol (VENTOLIN HFA) 108 (90 Base) MCG/ACT inhaler Inhale into the lungs every 6 (six) hours as needed for wheezing or shortness of breath.    [provider]  AMITIZA 24 MCG capsule TAKE ONE CAPSULE BY MOUTH TWICE A DAY Patient not taking: Reported on 10/24/2021 07/14/16   Claretta Fraise, MD  clonazePAM (KLONOPIN) 1 MG tablet Take 1 mg by mouth 3 (three) times daily as needed. 06/23/19   [provider]  cyclobenzaprine (FLEXERIL) 10 MG tablet TAKE ONE TABLET BY MOUTH THREE TIMES DAILY AS NEEDED FOR MUSCLE SPASM 07/14/16   Claretta Fraise, MD  doxycycline (MONODOX) 100 MG capsule Take 1 capsule (100 mg total) by mouth 2 (two) times daily. 12/27/20   Festus Aloe, MD  doxycycline (VIBRAMYCIN) 100 MG capsule Take 1 capsule (100 mg total) by mouth 2 (two) times daily. 04/04/21   Festus Aloe, MD  escitalopram (LEXAPRO) 10 MG tablet Take 10 mg by mouth daily. 06/23/19   [provider]  estradiol (ESTRACE) 0.5 MG tablet Take 1 tablet by mouth daily. 02/18/18   [provider]  ezetimibe (ZETIA) 10 MG tablet Take 10 mg by mouth daily. 05/09/19   [provider]  furosemide (LASIX) 20 MG tablet TAKE ONE (1) TABLET EACH DAY 08/10/16   Claretta Fraise, MD  levothyroxine (SYNTHROID, LEVOTHROID) 75 MCG tablet TAKE ONE (1) TABLET EACH DAY 05/15/16   Claretta Fraise, MD  LINZESS 290 MCG CAPS capsule Take 290 mcg by mouth daily. 07/19/20   [provider]  metoprolol succinate (TOPROL-XL) 50 MG 24 hr tablet TAKE ONE TABLET BY MOUTH TWICE DAILY 09/08/16   Satira Sark, MD  nitrofurantoin (MACRODANTIN) 50 MG capsule Take 1 capsule (50 mg total) by mouth at bedtime. 12/27/20    Festus Aloe, MD  Omega-3 Fatty Acids (FISH OIL) 1200 MG CAPS Take 1 capsule by mouth 2 (two) times daily.    [provider]  omeprazole (PRILOSEC) 20 MG capsule TAKE ONE CAPSULE BY MOUTH TWICE A DAY 07/06/16   Claretta Fraise, MD  potassium chloride (K-DUR) 10 MEQ tablet TAKE FIVE TABLETS TWICE DAILY 07/14/16   Claretta Fraise, MD  RESTASIS 0.05 % ophthalmic emulsion INSTILL ONE DROP IN BOTH EYES TWICE DAILY 01/11/16   Claretta Fraise, MD  rosuvastatin (  CRESTOR) 10 MG tablet Take 10 mg by mouth at bedtime. 11/04/20   [provider]  traZODone (DESYREL) 50 MG tablet Take 50 mg by mouth at bedtime. 05/09/19   [provider]  VASCEPA 1 g capsule Take 2 g by mouth 2 (two) times daily. 04/21/19   [provider]  venlafaxine XR (EFFEXOR-XR) 75 MG 24 hr capsule TAKE 3 CAPSULES EVERY MORNING 08/10/16   Claretta Fraise, MD    Family History Family History  Problem Relation Age of Onset   Breast cancer Mother    Breast cancer Sister    Heart attack Other        Family history    Coronary artery disease Other        Family History     Social History Social History   Tobacco Use   Smoking status: Never   Smokeless tobacco: Never  Substance Use Topics   Alcohol use: No    Alcohol/week: 0.0 standard drinks   Drug use: No    Comment: history of abuse      Allergies   Clindamycin, Diphenhydramine hcl, and Naproxen   Review of Systems Review of Systems PER HPI  Physical Exam Triage Vital Signs ED Triage Vitals  Enc Vitals Group     BP 11/06/21 1507 126/79     Pulse Rate 11/06/21 1507 92     Resp 11/06/21 1507 18     Temp 11/06/21 1507 97.7 F (36.5 C)     Temp Source 11/06/21 1507 Oral     SpO2 11/06/21 1507 97 %     Weight --      Height --      Head Circumference --      Peak Flow --      Pain Score 11/06/21 1512 10     Pain Loc --      Pain Edu? --      Excl. in [place]? --    No data found.  Updated Vital Signs BP 126/79 (BP Location:  Right Arm)   Pulse 92   Temp 97.7 F (36.5 C) (Oral)   Resp 18   SpO2 97%   Visual Acuity Right Eye Distance:   Left Eye Distance:   Bilateral Distance:    Right Eye Near:   Left Eye Near:    Bilateral Near:     Physical Exam Vitals and nursing note reviewed.  Constitutional:      General: She is not in acute distress.    Appearance: She is well-developed.  HENT:     Head: Normocephalic.     Mouth/Throat:     Mouth: Mucous membranes are moist.  Eyes:     Pupils: Pupils are equal, round, and reactive to light.  Cardiovascular:     Rate and Rhythm: Normal rate and regular rhythm.     Pulses: Normal pulses.     Heart sounds: Normal heart sounds.  Pulmonary:     Effort: Pulmonary effort is normal.     Breath sounds: Normal breath sounds.  Abdominal:     General: Bowel sounds are normal. There is no distension.     Palpations: Abdomen is soft.     Tenderness: There is no abdominal tenderness. There is no right CVA tenderness, left CVA tenderness, guarding or rebound.  Genitourinary:    Vagina: Normal. No vaginal discharge.  Musculoskeletal:     Cervical back: Normal range of motion.  Skin:    General: Skin is warm and dry.  Findings: No erythema or rash.  Neurological:     General: No focal deficit present.     Mental Status: She is alert and oriented to person, place, and time.     Cranial Nerves: No cranial nerve deficit.  Psychiatric:        Mood and Affect: Mood normal.        Behavior: Behavior normal.     UC Treatments / Results  Labs (all labs ordered are listed, but only abnormal results are displayed) Labs Reviewed  POCT URINALYSIS DIP (MANUAL ENTRY) - Abnormal; Notable for the following components:      Result Value   Clarity, UA cloudy (*)    Spec Grav, UA >=1.030 (*)    Blood, UA small (*)    Protein Ur, POC =30 (*)    Nitrite, UA Positive (*)    Leukocytes, UA Small (1+) (*)    All other components within normal limits  URINE CULTURE     EKG   Radiology No results found.  Procedures Procedures (including critical care time)  Medications Ordered in UC Medications - No data to display  Initial Impression / Assessment and Plan / UC Course  I have reviewed the triage vital signs and the nursing notes.  Pertinent labs & imaging results that were available during my care of the patient were reviewed by me and considered in my medical decision making (see chart for details).  Patient presents for complaints of urinary symptoms.  Symptoms have been present for almost 2 weeks.  Patient does have a history of recurrent UTI, and is currently under the care of urology.  Her urinalysis is positive for leukocytes and nitrates.  We will start patient on Macrobid for her symptoms.  Urine culture is pending.  Patient was advised that she should follow-up with urology if her symptoms do not improve after the antibiotic.  There is no concern for pyelonephritis as patient's vital signs are stable, she is in no acute distress.  Patient was also given strict return precautions of when to follow-up. Final Clinical Impressions(s) / UC Diagnoses   Final diagnoses:  Acute cystitis without hematuria     Discharge Instructions      Take medication as prescribed. Increase fluids.  You should be drinking at least 6-8 8 ounce glasses of water while symptoms persist. Void every 2 hours while symptoms persist. May take over-the-counter ibuprofen or Tylenol as needed for pain or fever. If you develop fever, chills, moderate low back pain, or if symptoms do not improve, follow-up in our office. A urine culture has been ordered to ensure you are being treated with the appropriate antibiotic.  If the medication needs to be changed, you will be contacted. Please call the urologist on Tuesday to let them know you have been treated for urinary tract infection.  If your symptoms do not improve, recommend that you follow-up with the urologist as you  are seeing them for frequent urinary tract infections.    ED Prescriptions     Medication Sig Dispense Auth. Provider   nitrofurantoin, macrocrystal-monohydrate, (MACROBID) 100 MG capsule Take 1 capsule (100 mg total) by mouth 2 (two) times daily. 10 capsule Ashika Apuzzo-Warren, Alda Lea, NP      PDMP not reviewed this encounter.   Tish Men, NP 11/06/21 1609

## 2021-11-06 NOTE — ED Triage Notes (Signed)
Hurts to urinate  since 5/17 and urinary frequency

## 2021-11-06 NOTE — Discharge Instructions (Addendum)
Take medication as prescribed. Increase fluids.  You should be drinking at least 6-8 8 ounce glasses of water while symptoms persist. Void every 2 hours while symptoms persist. May take over-the-counter ibuprofen or Tylenol as needed for pain or fever. If you develop fever, chills, moderate low back pain, or if symptoms do not improve, follow-up in our office. A urine culture has been ordered to ensure you are being treated with the appropriate antibiotic.  If the medication needs to be changed, you will be contacted. Please call the urologist on Tuesday to let them know you have been treated for urinary tract infection.  If your symptoms do not improve, recommend that you follow-up with the urologist as you are seeing them for frequent urinary tract infections.

## 2021-11-09 LAB — URINE CULTURE: Culture: >=100000 — AB

## 2021-11-16 ENCOUNTER — Telehealth: Payer: Self-pay

## 2021-11-16 NOTE — Telephone Encounter (Signed)
Patient called to let office know she was seen at Urgent Care with a severe UTI.  Thanks, Rosey Bath

## 2021-11-17 NOTE — Telephone Encounter (Signed)
Patient returned call to office and reports her symptoms have improved since seen at Urgent Care.

## 2021-11-17 NOTE — Telephone Encounter (Signed)
Returned call to patient- no answer.

## 2021-12-21 ENCOUNTER — Ambulatory Visit
Admission: RE | Admit: 2021-12-21 | Discharge: 2021-12-21 | Disposition: A | Discharge status: Home / Self Care | Payer: Medicare Other | Source: Ambulatory Visit | Attending: Nurse Practitioner | Admitting: Nurse Practitioner

## 2021-12-21 VITALS — BP 102/66 | HR 92 | Temp 97.8°F | Resp 18

## 2021-12-21 DIAGNOSIS — M542 Cervicalgia: Secondary | ICD-10-CM | POA: Diagnosis not present

## 2021-12-21 DIAGNOSIS — L089 Local infection of the skin and subcutaneous tissue, unspecified: Secondary | ICD-10-CM | POA: Diagnosis not present

## 2021-12-21 MED ORDER — ACETAMINOPHEN ER 650 MG PO TBCR: 650.0000 mg | EXTENDED_RELEASE_TABLET | Freq: Three times a day (TID) | ORAL | 0 refills | Status: AC | PRN

## 2021-12-21 MED ORDER — DOXYCYCLINE HYCLATE 100 MG PO TABS: 100.0000 mg | ORAL_TABLET | Freq: Two times a day (BID) | ORAL | 0 refills | Status: DC

## 2021-12-21 NOTE — Discharge Instructions (Addendum)
Take medication as prescribed. Warm compresses to the left neck to help with pain or other discomfort. May apply cool compresses to help with swelling.  Gentle range of motion exercises for your neck to help prevent stiffness or decreased mobility May apply ice or heat to the neck.  May apply ice for pain or swelling, heat for spasm or stiffness.  Apply for 20 minutes, remove for 1 hour, then repeat.  Follow-up with your primary care physician if symptoms do not improve.

## 2021-12-21 NOTE — ED Provider Notes (Signed)
[facility]    CSN: [id number] Arrival date & time: 12/21/21  1056      History   Chief Complaint Chief Complaint  Patient presents with   Neck Injury    neck pain, knot on left side - Entered by patient   Appointment    1130    HPI [name] is a 71 y.o. female.   The history is provided by the patient.   Patient presents for complaints of neck pain for 2 to 3 months an a "knot" on her neck that has been present for the past 2 to 3 days.  Patient states that the neck pain started on the right side of her neck and has since moved to the left side.  She has pain with rotation.  She denies any recent injury or trauma.  She did state that approximately 2 to 3 months ago she was sitting in her yard when a dog jumped on her back and she jumped suddenly.  She further denies numbness, tingling, hand pain, or shoulder pain.  She has not taken any medication for her symptoms.  Patient also complains of a knot to the left side of her neck.  Symptoms have been present for the past 2 to 3 days.  Area is tender to palpation.  She denies fever, chills, drainage, oozing, or fluctuance.  She denies any known injury or trauma to the area.  Patient reports history of thyroid disease.  Past Medical History:  Diagnosis Date   Anxiety    Arthritis    Borderline hypertension    Chronic lower back pain    Chronic shoulder pain    Depression    Fe deficiency anemia    GERD (gastroesophageal reflux disease)    Heartburn    Hiatal hernia    History of cardiac catheterization    Normal coronaries   History of mixed drug abuse (HCC)    Hyperlipidemia    Hypertension    Hypothyroidism    [name]    Lumbar herniated disc    L4-L5    Microscopic hematuria    Obesity    Personal history of venous thrombosis and embolism    PSVT (paroxysmal supraventricular tachycardia) (HCC) 8/05   Recurrent cystitis    Dr. Aldean Ast     Patient Active Problem List   Diagnosis Date  Noted   S/P ORIF (open reduction internal fixation) fracture right ankle 02/02/16 removal hardware 03/08/16 08/30/2017   Post-menopause 07/05/2016   Benzodiazepine dependence (HCC) 05/07/2016   Failed orthopedic implant (HCC)    Fracture of ankle, trimalleolar, right, closed    Depression 06/29/2015   GAD (generalized anxiety disorder) 06/29/2015   LVH (left ventricular hypertrophy) 03/24/2015   Arthritis 03/24/2015   Hypothyroidism    Vitamin D deficiency 01/02/2013   Fatigue 10/03/2012   Obesity 10/03/2012   PSVT (paroxysmal supraventricular tachycardia) (HCC) 02/05/2012   Right bundle branch block 02/03/2011   Essential hypertension, benign 03/02/2009    Past Surgical History:  Procedure Laterality Date   APPENDECTOMY  2004?or 11/03?   BACK SURGERY     BLADDER REPAIR     CARPAL TUNNEL RELEASE     x2    HARDWARE REMOVAL Right 02/18/2016   Procedure: HARDWARE REMOVAL;  Surgeon: Vickki Hearing, MD;  Location: [facility];  Service: Orthopedics;  Laterality: Right;  right ankle medial malleolar screw   HARDWARE REMOVAL Right 03/08/2016   Procedure: HARDWARE REMOVAL AND INTERNAL FIXATION OF RIGHT MEDIAL MALLEOLUS  -  RIGHT ANKLE REMOVAL OF MALLEOLAR SCREW, INSERTION OF 2  0.45 K WIRES;  Surgeon: Carole Civil, MD;  Location: [facility];  Service: Orthopedics;  Laterality: Right;  RIGHT ANKLE REMOVAL OF MALLEOLAR SCREW   LIVER BIOPSY  2002   Dr. Tamala Julian    ORIF ANKLE FRACTURE Right 02/02/2016   Procedure: OPEN REDUCTION INTERNAL FIXATION (ORIF) ANKLE FRACTURE;  Surgeon: Carole Civil, MD;  Location: [facility];  Service: Orthopedics;  Laterality: Right;   ORIF ANKLE FRACTURE Right 02/18/2016   Procedure: OPEN REDUCTION INTERNAL FIXATION (ORIF) ANKLE FRACTURE;  Surgeon: Carole Civil, MD;  Location: [facility];  Service: Orthopedics;  Laterality: Right;  reinsertion of medial malleolar screw   PARTIAL HYSTERECTOMY  2004   One ovary    RECTAL PROLAPSE REPAIR     RECTOCELE REPAIR  05/2001    Right knee arthroscopy  8/05   Right knee replacement  8/05   SHOULDER SURGERY      OB History     Gravida  6   Para  5   Term  5   Preterm  0   AB  1   Living  5      SAB  1   IAB  0   Ectopic  0   Multiple  0   Live Births               Home Medications    Prior to Admission medications   Medication Sig Start Date End Date Taking? Authorizing Provider  acetaminophen (TYLENOL 8 HOUR) 650 MG CR tablet Take 1 tablet (650 mg total) by mouth every 8 (eight) hours as needed for pain. 12/21/21  Yes Jaymari Cromie-Warren, Alda Lea, NP  cephALEXin (KEFLEX) 500 MG capsule Take 1 capsule (500 mg total) by mouth 2 (two) times daily. Start now in preparation for your cystoscopy on Monday (a quick look in the bladder) Patient not taking: Reported on 10/24/2021 07/29/21   Festus Aloe, MD  doxycycline (VIBRA-TABS) 100 MG tablet Take 1 tablet (100 mg total) by mouth 2 (two) times daily. 12/21/21  Yes Clayton Bosserman-Warren, Alda Lea, NP  albuterol (VENTOLIN HFA) 108 (90 Base) MCG/ACT inhaler Inhale into the lungs every 6 (six) hours as needed for wheezing or shortness of breath.    [provider]  AMITIZA 24 MCG capsule TAKE ONE CAPSULE BY MOUTH TWICE A DAY Patient not taking: Reported on 10/24/2021 07/14/16   Claretta Fraise, MD  clonazePAM (KLONOPIN) 1 MG tablet Take 1 mg by mouth 3 (three) times daily as needed. 06/23/19   [provider]  cyclobenzaprine (FLEXERIL) 10 MG tablet TAKE ONE TABLET BY MOUTH THREE TIMES DAILY AS NEEDED FOR MUSCLE SPASM 07/14/16   Claretta Fraise, MD  doxycycline (MONODOX) 100 MG capsule Take 1 capsule (100 mg total) by mouth 2 (two) times daily. 12/27/20   Festus Aloe, MD  escitalopram (LEXAPRO) 10 MG tablet Take 10 mg by mouth daily. 06/23/19   [provider]  estradiol (ESTRACE) 0.5 MG tablet Take 1 tablet by mouth daily. 02/18/18   [provider]  ezetimibe (ZETIA) 10 MG tablet Take 10 mg by mouth daily. 05/09/19   [provider]  furosemide (LASIX) 20 MG tablet TAKE ONE (1) TABLET EACH DAY 08/10/16   Claretta Fraise, MD  levothyroxine (SYNTHROID, LEVOTHROID) 75 MCG tablet TAKE ONE (1) TABLET EACH DAY 05/15/16   Claretta Fraise, MD  LINZESS 290 MCG CAPS capsule Take 290 mcg by mouth daily. 07/19/20   [provider]  metoprolol succinate (TOPROL-XL) 50 MG 24 hr tablet TAKE ONE TABLET BY MOUTH TWICE DAILY 09/08/16   Satira Sark, MD  nitrofurantoin (MACRODANTIN) 50 MG capsule Take 1 capsule (50 mg total) by mouth at bedtime. 12/27/20   Festus Aloe, MD  nitrofurantoin, macrocrystal-monohydrate, (MACROBID) 100 MG capsule Take 1 capsule (100 mg total) by mouth 2 (two) times daily. 11/06/21   Lashann Hagg-Warren, Alda Lea, NP  Omega-3 Fatty Acids (FISH OIL) 1200 MG CAPS Take 1 capsule by mouth 2 (two) times daily.    [provider]  omeprazole (PRILOSEC) 20 MG capsule TAKE ONE CAPSULE BY MOUTH TWICE A DAY 07/06/16   Claretta Fraise, MD  potassium chloride (K-DUR) 10 MEQ tablet TAKE FIVE TABLETS TWICE DAILY 07/14/16   Claretta Fraise, MD  RESTASIS 0.05 % ophthalmic emulsion INSTILL ONE DROP IN BOTH EYES TWICE DAILY 01/11/16   Claretta Fraise, MD  rosuvastatin (CRESTOR) 10 MG tablet Take 10 mg by mouth at bedtime. 11/04/20   [provider]  traZODone (DESYREL) 50 MG tablet Take 50 mg by mouth at bedtime. 05/09/19   [provider]  VASCEPA 1 g capsule Take 2 g by mouth 2 (two) times daily. 04/21/19   [provider]  venlafaxine XR (EFFEXOR-XR) 75 MG 24 hr capsule TAKE 3 CAPSULES EVERY MORNING 08/10/16   Claretta Fraise, MD    Family History Family History  Problem Relation Age of Onset   Breast cancer Mother    Breast cancer Sister    Heart attack Other        Family history    Coronary artery disease Other        Family History     Social History Social History   Tobacco Use   Smoking status: Never   Smokeless tobacco: Never  Substance Use Topics   Alcohol use: No     Alcohol/week: 0.0 standard drinks of alcohol   Drug use: No    Comment: history of abuse      Allergies   Clindamycin, Diphenhydramine, Diphenhydramine hcl, and Naproxen   Review of Systems Review of Systems Per HPI  Physical Exam Triage Vital Signs ED Triage Vitals  Enc Vitals Group     BP 12/21/21 1152 102/66     Pulse Rate 12/21/21 1152 92     Resp 12/21/21 1152 18     Temp 12/21/21 1152 97.8 F (36.6 C)     Temp Source 12/21/21 1152 Oral     SpO2 12/21/21 1152 97 %     Weight --      Height --      Head Circumference --      Peak Flow --      Pain Score 12/21/21 1154 8     Pain Loc --      Pain Edu? --      Excl. in [place]? --    No data found.  Updated Vital Signs BP 102/66 (BP Location: Right Arm)   Pulse 92   Temp 97.8 F (36.6 C) (Oral)   Resp 18   SpO2 97%   Visual Acuity Right Eye Distance:   Left Eye Distance:   Bilateral Distance:    Right Eye Near:   Left Eye Near:    Bilateral Near:     Physical Exam Vitals and nursing note reviewed.  Constitutional:      General: She is not in acute distress.    Appearance: Normal appearance.  HENT:  Head: Normocephalic.     Right Ear: Tympanic membrane, ear canal and external ear normal.     Left Ear: Tympanic membrane, ear canal and external ear normal.  Eyes:     Pupils: Pupils are equal, round, and reactive to light.  Cardiovascular:     Rate and Rhythm: Normal rate.     Pulses: Normal pulses.     Heart sounds: Normal heart sounds.  Pulmonary:     Effort: Pulmonary effort is normal.     Breath sounds: Normal breath sounds.  Abdominal:     General: Bowel sounds are normal.     Palpations: Abdomen is soft.  Musculoskeletal:     Cervical back: Normal range of motion and neck supple. No edema, erythema or crepitus. Pain with movement and muscular tenderness present. Normal range of motion.  Skin:    General: Skin is warm and dry.     Comments: Induration to the left anterior neck extending  into left chest. Area measures approximately 3cm x 3cm. Area is tender to palpation.   Neurological:     Mental Status: She is alert.  Psychiatric:        Mood and Affect: Mood normal.        Behavior: Behavior normal.      UC Treatments / Results  Labs (all labs ordered are listed, but only abnormal results are displayed) Labs Reviewed - No data to display  EKG   Radiology No results found.  Procedures Procedures (including critical care time)  Medications Ordered in UC Medications - No data to display  Initial Impression / Assessment and Plan / UC Course  I have reviewed the triage vital signs and the nursing notes.  Pertinent labs & imaging results that were available during my care of the patient were reviewed by me and considered in my medical decision making (see chart for details).  Patient presents for complaints of neck pain and swelling to the left neck.  The neck pain has been present for the past 2 to 3 months.  Patient has good range of motion, but has tenderness to the left cervical spine.  There is no obvious injury or trauma to the left neck.  Differential diagnoses include cervical sprain versus degenerative disc disease.  With regard to the induration on her neck.  Cannot rule out soft tissue infection at this time as the area has become more tender and painful.  Area measures approximately 3 cm x 3 cm.  Tylenol was for scribed for the patient's neck pain and doxycycline was prescribed for possible soft tissue infection.  Supportive care recommendations were provided to the patient.  Patient was advised to follow-up with her PCP if her symptoms do not improve.  Final diagnoses:  Neck pain  Soft tissue infection     Discharge Instructions      Take medication as prescribed. Warm compresses to the left neck to help with pain or other discomfort. May apply cool compresses to help with swelling.  Gentle range of motion exercises for your neck to help  prevent stiffness or decreased mobility May apply ice or heat to the neck.  May apply ice for pain or swelling, heat for spasm or stiffness.  Apply for 20 minutes, remove for 1 hour, then repeat.  Follow-up with your primary care physician if symptoms do not improve.    ED Prescriptions     Medication Sig Dispense Auth. Provider   doxycycline (VIBRA-TABS) 100 MG tablet Take 1 tablet (100  mg total) by mouth 2 (two) times daily. 20 tablet Caralee Morea-Warren, Alda Lea, NP   acetaminophen (TYLENOL 8 HOUR) 650 MG CR tablet Take 1 tablet (650 mg total) by mouth every 8 (eight) hours as needed for pain. 30 tablet Melanny Wire-Warren, Alda Lea, NP      PDMP not reviewed this encounter.   Tish Men, NP 12/21/21 1245

## 2021-12-21 NOTE — ED Triage Notes (Signed)
Pt reports neck pain x 2-3 month; painful knot in the neck x 2 days. Pt has not taken any meds for complaint.

## 2021-12-26 NOTE — Progress Notes (Deleted)
Cardiology Office Note   Date:  12/26/2021   ID:  [name], DOB 1950-08-18, MRN [id number]  PCP:  Celene Squibb, MD  Cardiologist:  Dr. Domenic Polite    No chief complaint on file.     History of Present Illness:[name] is a 71 y.o. female who presents for PSVT, hx RBBB and HTN.  Hx of normal echo in 2019 with EF 55-60% ***    Past Medical History:  Diagnosis Date   Anxiety    Arthritis    Borderline hypertension    Chronic lower back pain    Chronic shoulder pain    Depression    Fe deficiency anemia    GERD (gastroesophageal reflux disease)    Heartburn    Hiatal hernia    History of cardiac catheterization    Normal coronaries   History of mixed drug abuse ([name])    Hyperlipidemia    Hypertension    Hypothyroidism    Dr. Derrill Memo    Lumbar herniated disc    L4-L5    Microscopic hematuria    Obesity    Personal history of venous thrombosis and embolism    PSVT (paroxysmal supraventricular tachycardia) ([name]) 8/05   Recurrent cystitis    Dr. Serita Butcher     Past Surgical History:  Procedure Laterality Date   APPENDECTOMY  2004?or 11/03?   BACK SURGERY     BLADDER REPAIR     CARPAL TUNNEL RELEASE     x2    HARDWARE REMOVAL Right 02/18/2016   Procedure: HARDWARE REMOVAL;  Surgeon: Carole Civil, MD;  Location: [facility];  Service: Orthopedics;  Laterality: Right;  right ankle medial malleolar screw   HARDWARE REMOVAL Right 03/08/2016   Procedure: HARDWARE REMOVAL AND INTERNAL FIXATION OF RIGHT MEDIAL MALLEOLUS  - RIGHT ANKLE REMOVAL OF MALLEOLAR SCREW, INSERTION OF 2  0.45 K WIRES;  Surgeon: Carole Civil, MD;  Location: [facility];  Service: Orthopedics;  Laterality: Right;  RIGHT ANKLE REMOVAL OF MALLEOLAR SCREW   LIVER BIOPSY  2002   Dr. Tamala Julian    ORIF ANKLE FRACTURE Right 02/02/2016   Procedure: OPEN REDUCTION INTERNAL FIXATION (ORIF) ANKLE FRACTURE;  Surgeon: Carole Civil, MD;  Location: [facility];  Service: Orthopedics;  Laterality: Right;   ORIF  ANKLE FRACTURE Right 02/18/2016   Procedure: OPEN REDUCTION INTERNAL FIXATION (ORIF) ANKLE FRACTURE;  Surgeon: Carole Civil, MD;  Location: [facility];  Service: Orthopedics;  Laterality: Right;  reinsertion of medial malleolar screw   PARTIAL HYSTERECTOMY  2004   One ovary    RECTAL PROLAPSE REPAIR     RECTOCELE REPAIR  05/2001   Right knee arthroscopy  8/05   Right knee replacement  8/05   SHOULDER SURGERY       Current Outpatient Medications  Medication Sig Dispense Refill   cephALEXin (KEFLEX) 500 MG capsule Take 1 capsule (500 mg total) by mouth 2 (two) times daily. Start now in preparation for your cystoscopy on Monday (a quick look in the bladder) (Patient not taking: Reported on 10/24/2021) 6 capsule 0   acetaminophen (TYLENOL 8 HOUR) 650 MG CR tablet Take 1 tablet (650 mg total) by mouth every 8 (eight) hours as needed for pain. 30 tablet 0   albuterol (VENTOLIN HFA) 108 (90 Base) MCG/ACT inhaler Inhale into the lungs every 6 (six) hours as needed for wheezing or shortness of breath.     AMITIZA 24 MCG capsule TAKE ONE CAPSULE BY MOUTH TWICE A DAY (Patient  not taking: Reported on 10/24/2021) 60 capsule 5   clonazePAM (KLONOPIN) 1 MG tablet Take 1 mg by mouth 3 (three) times daily as needed.     cyclobenzaprine (FLEXERIL) 10 MG tablet TAKE ONE TABLET BY MOUTH THREE TIMES DAILY AS NEEDED FOR MUSCLE SPASM 90 tablet 5   doxycycline (MONODOX) 100 MG capsule Take 1 capsule (100 mg total) by mouth 2 (two) times daily. 6 capsule 0   doxycycline (VIBRA-TABS) 100 MG tablet Take 1 tablet (100 mg total) by mouth 2 (two) times daily. 20 tablet 0   escitalopram (LEXAPRO) 10 MG tablet Take 10 mg by mouth daily.     estradiol (ESTRACE) 0.5 MG tablet Take 1 tablet by mouth daily.     ezetimibe (ZETIA) 10 MG tablet Take 10 mg by mouth daily.     furosemide (LASIX) 20 MG tablet TAKE ONE (1) TABLET EACH DAY 30 tablet 2   levothyroxine (SYNTHROID, LEVOTHROID) 75 MCG tablet TAKE ONE (1) TABLET EACH DAY 90  tablet 3   LINZESS 290 MCG CAPS capsule Take 290 mcg by mouth daily.     metoprolol succinate (TOPROL-XL) 50 MG 24 hr tablet TAKE ONE TABLET BY MOUTH TWICE DAILY 180 tablet 0   nitrofurantoin (MACRODANTIN) 50 MG capsule Take 1 capsule (50 mg total) by mouth at bedtime. 60 capsule 0   nitrofurantoin, macrocrystal-monohydrate, (MACROBID) 100 MG capsule Take 1 capsule (100 mg total) by mouth 2 (two) times daily. 10 capsule 0   Omega-3 Fatty Acids (FISH OIL) 1200 MG CAPS Take 1 capsule by mouth 2 (two) times daily.     omeprazole (PRILOSEC) 20 MG capsule TAKE ONE CAPSULE BY MOUTH TWICE A DAY 60 capsule 5   potassium chloride (K-DUR) 10 MEQ tablet TAKE FIVE TABLETS TWICE DAILY 300 tablet 2   RESTASIS 0.05 % ophthalmic emulsion INSTILL ONE DROP IN BOTH EYES TWICE DAILY 60 each 11   rosuvastatin (CRESTOR) 10 MG tablet Take 10 mg by mouth at bedtime.     traZODone (DESYREL) 50 MG tablet Take 50 mg by mouth at bedtime.     VASCEPA 1 g capsule Take 2 g by mouth 2 (two) times daily.     venlafaxine XR (EFFEXOR-XR) 75 MG 24 hr capsule TAKE 3 CAPSULES EVERY MORNING 90 capsule 1   No current facility-administered medications for this visit.    Allergies:   Clindamycin, Diphenhydramine, Diphenhydramine hcl, and Naproxen    Social History:  The patient  reports that she has never smoked. She has never used smokeless tobacco. She reports that she does not drink alcohol and does not use drugs.   Family History:  The patient's ***family history includes Breast cancer in her mother and sister; Coronary artery disease in an other family member; Heart attack in an other family member.    ROS:  General:no colds or fevers, no weight changes Skin:no rashes or ulcers HEENT:no blurred vision, no congestion CV:see HPI PUL:see HPI GI:no diarrhea constipation or melena, no indigestion GU:no hematuria, no dysuria MS:no joint pain, no claudication Neuro:no syncope, no lightheadedness Endo:no diabetes, no thyroid  disease Wt Readings from Last 3 Encounters:  08/25/20 244 lb (110.7 kg)  06/26/19 235 lb (106.6 kg)  05/03/18 241 lb 9.6 oz (109.6 kg)     PHYSICAL EXAM: VS:  There were no vitals taken for this visit. , BMI There is no height or weight on file to calculate BMI. General:Pleasant affect, NAD Skin:Warm and dry, brisk capillary refill HEENT:normocephalic, sclera clear, mucus membranes moist  Neck:supple, no JVD, no bruits  Heart:S1S2 RRR without murmur, gallup, rub or click Lungs:clear without rales, rhonchi, or wheezes ZSW:FUXN, non tender, + BS, do not palpate liver spleen or masses Ext:no lower ext edema, 2+ pedal pulses, 2+ radial pulses Neuro:alert and oriented, MAE, follows commands, + facial symmetry    EKG:  EKG is ordered today. The ekg ordered today demonstrates ***   Recent Labs: No results found for requested labs within last 365 days.    Lipid Panel    Component Value Date/Time   CHOL 326 (H) 07/05/2016 1459   CHOL 166 01/02/2013 1146   TRIG 202 (H) 07/05/2016 1459   TRIG 122 10/26/2014 1655   TRIG 103 01/02/2013 1146   HDL 60 07/05/2016 1459   HDL 72 10/26/2014 1655   HDL 75 01/02/2013 1146   CHOLHDL 5.4 (H) 07/05/2016 1459   LDLCALC 226 (H) 07/05/2016 1459   LDLCALC 57 12/09/2013 1048   LDLCALC 70 01/02/2013 1146       Other studies Reviewed: Additional studies/ records that were reviewed today include: ***. TTE 2019  Study Conclusions   - Left ventricle: The cavity size was normal. Wall thickness was    normal. Systolic function was normal. The estimated ejection    fraction was in the range of 55% to 60%. Wall motion was normal;    there were no regional wall motion abnormalities. Left    ventricular diastolic function parameters were normal.  - Atrial septum: No defect or patent foramen ovale was identified.  ASSESSMENT AND PLAN:  1.  ***   Current medicines are reviewed with the patient today.  The patient Has no concerns regarding  medicines.  The following changes have been made:  See above Labs/ tests ordered today include:see above  Disposition:   FU:  see above  Signed, Nada Boozer, NP  12/26/2021 2:17 PM    [facility] [address] [place], [place], [place]  [address]/ [address] [employer] [employer] [address] [place], [place] Phone: [phone]; Fax: [phone]  [phone]

## 2021-12-27 ENCOUNTER — Ambulatory Visit: Payer: Medicare Other | Admitting: Cardiology

## 2022-01-09 NOTE — Progress Notes (Deleted)
Cardiology Office Note    Date:  01/09/2022   ID:  [name], DOB June 16, 1950, MRN [id number]   PCP:  Benita Stabile, MD   [facility]  Cardiologist:  Nona Dell, MD *** Advanced Practice Provider:  No care team member to display Electrophysiologist:  None   [phone]   No chief complaint on file.   History of Present Illness:  [name] is a 71 y.o. female with history of HTN, PSVT on toprol. Last saw Dr. Diona Browner 08/2020 and doing well.    Past Medical History:  Diagnosis Date   Anxiety    Arthritis    Borderline hypertension    Chronic lower back pain    Chronic shoulder pain    Depression    Fe deficiency anemia    GERD (gastroesophageal reflux disease)    Heartburn    Hiatal hernia    History of cardiac catheterization    Normal coronaries   History of mixed drug abuse (HCC)    Hyperlipidemia    Hypertension    Hypothyroidism    [name]    Lumbar herniated disc    L4-L5    Microscopic hematuria    Obesity    Personal history of venous thrombosis and embolism    PSVT (paroxysmal supraventricular tachycardia) (HCC) 8/05   Recurrent cystitis    Dr. Aldean Ast     Past Surgical History:  Procedure Laterality Date   APPENDECTOMY  2004?or 11/03?   BACK SURGERY     BLADDER REPAIR     CARPAL TUNNEL RELEASE     x2    HARDWARE REMOVAL Right 02/18/2016   Procedure: HARDWARE REMOVAL;  Surgeon: Vickki Hearing, MD;  Location: [facility];  Service: Orthopedics;  Laterality: Right;  right ankle medial malleolar screw   HARDWARE REMOVAL Right 03/08/2016   Procedure: HARDWARE REMOVAL AND INTERNAL FIXATION OF RIGHT MEDIAL MALLEOLUS  - RIGHT ANKLE REMOVAL OF MALLEOLAR SCREW, INSERTION OF 2  0.45 K WIRES;  Surgeon: Vickki Hearing, MD;  Location: [facility];  Service: Orthopedics;  Laterality: Right;  RIGHT ANKLE REMOVAL OF MALLEOLAR SCREW   LIVER BIOPSY  2002   Dr. Katrinka Blazing    ORIF ANKLE FRACTURE Right 02/02/2016   Procedure: OPEN  REDUCTION INTERNAL FIXATION (ORIF) ANKLE FRACTURE;  Surgeon: Vickki Hearing, MD;  Location: [facility];  Service: Orthopedics;  Laterality: Right;   ORIF ANKLE FRACTURE Right 02/18/2016   Procedure: OPEN REDUCTION INTERNAL FIXATION (ORIF) ANKLE FRACTURE;  Surgeon: Vickki Hearing, MD;  Location: [facility];  Service: Orthopedics;  Laterality: Right;  reinsertion of medial malleolar screw   PARTIAL HYSTERECTOMY  2004   One ovary    RECTAL PROLAPSE REPAIR     RECTOCELE REPAIR  05/2001   Right knee arthroscopy  8/05   Right knee replacement  8/05   SHOULDER SURGERY      Current Medications: No outpatient medications have been marked as taking for the 01/11/22 encounter (Appointment) with Dyann Kief, PA-C.     Allergies:   Clindamycin, Diphenhydramine, Diphenhydramine hcl, and Naproxen   Social History   Socioeconomic History   Marital status: Single    Spouse name: Not on file   Number of children: 5   Years of education: Not on file   Highest education level: Not on file  Occupational History   Occupation: Disabled     Employer: UNEMPLOYED  Tobacco Use   Smoking status: Never   Smokeless tobacco: Never  Substance and Sexual Activity   Alcohol use: No    Alcohol/week: 0.0 standard drinks of alcohol   Drug use: No    Comment: history of abuse    Sexual activity: Not on file  Other Topics Concern   Not on file  Social History Narrative   Not on file   Social Determinants of Health   Financial Resource Strain: Not on file  Food Insecurity: Not on file  Transportation Needs: Not on file  Physical Activity: Not on file  Stress: Not on file  Social Connections: Not on file     Family History:  The patient's ***family history includes Breast cancer in her mother and sister; Coronary artery disease in an other family member; Heart attack in an other family member.   ROS:   Please see the history of present illness.    ROS All other systems reviewed and are  negative.   PHYSICAL EXAM:   VS:  There were no vitals taken for this visit.  Physical Exam  GEN: Well nourished, well developed, in no acute distress  HEENT: normal  Neck: no JVD, carotid bruits, or masses Cardiac:RRR; no murmurs, rubs, or gallops  Respiratory:  clear to auscultation bilaterally, normal work of breathing GI: soft, nontender, nondistended, + BS Ext: without cyanosis, clubbing, or edema, Good distal pulses bilaterally MS: no deformity or atrophy  Skin: warm and dry, no rash Neuro:  Alert and Oriented x 3, Strength and sensation are intact Psych: euthymic mood, full affect  Wt Readings from Last 3 Encounters:  08/25/20 244 lb (110.7 kg)  06/26/19 235 lb (106.6 kg)  05/03/18 241 lb 9.6 oz (109.6 kg)      Studies/Labs Reviewed:   EKG:  EKG is*** ordered today.  The ekg ordered today demonstrates ***  Recent Labs: No results found for requested labs within last 365 days.   Lipid Panel    Component Value Date/Time   CHOL 326 (H) 07/05/2016 1459   CHOL 166 01/02/2013 1146   TRIG 202 (H) 07/05/2016 1459   TRIG 122 10/26/2014 1655   TRIG 103 01/02/2013 1146   HDL 60 07/05/2016 1459   HDL 72 10/26/2014 1655   HDL 75 01/02/2013 1146   CHOLHDL 5.4 (H) 07/05/2016 1459   LDLCALC 226 (H) 07/05/2016 1459   LDLCALC 57 12/09/2013 1048   LDLCALC 70 01/02/2013 1146    Additional studies/ records that were reviewed today include:  Echocardiogram 05/07/2018: - Left ventricle: The cavity size was normal. Wall thickness was    normal. Systolic function was normal. The estimated ejection    fraction was in the range of 55% to 60%. Wall motion was normal;    there were no regional wall motion abnormalities. Left    ventricular diastolic function parameters were normal.  - Atrial septum: No defect or patent foramen ovale was identified.    Risk Assessment/Calculations:   {Does this patient have ATRIAL FIBRILLATION?:[phone]}     ASSESSMENT:    No diagnosis  found.   PLAN:  In order of problems listed above:  PSVT on toprol  HTN  Shared Decision Making/Informed Consent   {Are you ordering a CV Procedure (e.g. stress test, cath, DCCV, TEE, etc)?   Press F2        :[id number]}    Medication Adjustments/Labs and Tests Ordered: Current medicines are reviewed at length with the patient today.  Concerns regarding medicines are outlined above.  Medication changes, Labs and Tests ordered today are listed in  the Patient Instructions below. There are no Patient Instructions on file for this visit.   Elson Clan, PA-C  01/09/2022 3:27 PM    [facility] [address] [place], [place], [place]  [address] Phone: [phone]; Fax: [phone]

## 2022-01-11 ENCOUNTER — Ambulatory Visit: Payer: Medicare Other | Admitting: Physician Assistant

## 2022-03-07 DIAGNOSIS — I1310 Hypertensive heart and chronic kidney disease without heart failure, with stage 1 through stage 4 chronic kidney disease, or unspecified chronic kidney disease: Secondary | ICD-10-CM | POA: Diagnosis not present

## 2022-03-07 DIAGNOSIS — E782 Mixed hyperlipidemia: Secondary | ICD-10-CM | POA: Diagnosis not present

## 2022-03-10 DIAGNOSIS — N289 Disorder of kidney and ureter, unspecified: Secondary | ICD-10-CM | POA: Insufficient documentation

## 2022-03-11 NOTE — Progress Notes (Signed)
No chief complaint on file.

## 2022-03-18 DIAGNOSIS — Z23 Encounter for immunization: Secondary | ICD-10-CM | POA: Diagnosis not present

## 2022-03-18 DIAGNOSIS — M5440 Lumbago with sciatica, unspecified side: Secondary | ICD-10-CM | POA: Insufficient documentation

## 2022-03-18 DIAGNOSIS — M8580 Other specified disorders of bone density and structure, unspecified site: Secondary | ICD-10-CM | POA: Diagnosis not present

## 2022-03-18 DIAGNOSIS — M19079 Primary osteoarthritis, unspecified ankle and foot: Secondary | ICD-10-CM | POA: Diagnosis not present

## 2022-03-18 DIAGNOSIS — E782 Mixed hyperlipidemia: Secondary | ICD-10-CM | POA: Diagnosis not present

## 2022-03-18 DIAGNOSIS — R944 Abnormal results of kidney function studies: Secondary | ICD-10-CM | POA: Diagnosis not present

## 2022-03-18 DIAGNOSIS — M5442 Lumbago with sciatica, left side: Secondary | ICD-10-CM | POA: Diagnosis not present

## 2022-03-18 DIAGNOSIS — F329 Major depressive disorder, single episode, unspecified: Secondary | ICD-10-CM | POA: Diagnosis not present

## 2022-03-18 DIAGNOSIS — I1310 Hypertensive heart and chronic kidney disease without heart failure, with stage 1 through stage 4 chronic kidney disease, or unspecified chronic kidney disease: Secondary | ICD-10-CM | POA: Diagnosis not present

## 2022-03-18 DIAGNOSIS — E039 Hypothyroidism, unspecified: Secondary | ICD-10-CM | POA: Diagnosis not present

## 2022-03-18 DIAGNOSIS — R7303 Prediabetes: Secondary | ICD-10-CM | POA: Diagnosis not present

## 2022-03-18 DIAGNOSIS — M7989 Other specified soft tissue disorders: Secondary | ICD-10-CM | POA: Diagnosis not present

## 2022-03-18 DIAGNOSIS — R32 Unspecified urinary incontinence: Secondary | ICD-10-CM | POA: Diagnosis not present

## 2022-04-11 ENCOUNTER — Ambulatory Visit: Payer: Medicare Other | Admitting: Medical

## 2022-04-24 ENCOUNTER — Ambulatory Visit: Payer: Medicare Other | Admitting: Cardiology

## 2022-04-24 NOTE — Progress Notes (Deleted)
Cardiology Office Note  Date: 04/24/2022   ID: [name], DOB 16-Dec-1950, MRN [id number]  PCP:  Benita Stabile, MD  Cardiologist:  Nona Dell, MD Electrophysiologist:  None   No chief complaint on file.   History of Present Illness: [name] is a 71 y.o. female last seen in March 2022.  Past Medical History:  Diagnosis Date   Anxiety    Arthritis    Borderline hypertension    Chronic lower back pain    Chronic shoulder pain    Depression    Fe deficiency anemia    GERD (gastroesophageal reflux disease)    Heartburn    Hiatal hernia    History of cardiac catheterization    Normal coronaries   History of mixed drug abuse (HCC)    Hyperlipidemia    Hypertension    Hypothyroidism    [name]    Lumbar herniated disc    L4-L5    Microscopic hematuria    Obesity    Personal history of venous thrombosis and embolism    PSVT (paroxysmal supraventricular tachycardia) (HCC) 8/05   Recurrent cystitis    Dr. Aldean Ast     Past Surgical History:  Procedure Laterality Date   APPENDECTOMY  2004?or 11/03?   BACK SURGERY     BLADDER REPAIR     CARPAL TUNNEL RELEASE     x2    HARDWARE REMOVAL Right 02/18/2016   Procedure: HARDWARE REMOVAL;  Surgeon: Vickki Hearing, MD;  Location: [facility];  Service: Orthopedics;  Laterality: Right;  right ankle medial malleolar screw   HARDWARE REMOVAL Right 03/08/2016   Procedure: HARDWARE REMOVAL AND INTERNAL FIXATION OF RIGHT MEDIAL MALLEOLUS  - RIGHT ANKLE REMOVAL OF MALLEOLAR SCREW, INSERTION OF 2  0.45 K WIRES;  Surgeon: Vickki Hearing, MD;  Location: [facility];  Service: Orthopedics;  Laterality: Right;  RIGHT ANKLE REMOVAL OF MALLEOLAR SCREW   LIVER BIOPSY  2002   Dr. Katrinka Blazing    ORIF ANKLE FRACTURE Right 02/02/2016   Procedure: OPEN REDUCTION INTERNAL FIXATION (ORIF) ANKLE FRACTURE;  Surgeon: Vickki Hearing, MD;  Location: [facility];  Service: Orthopedics;  Laterality: Right;   ORIF ANKLE FRACTURE Right 02/18/2016    Procedure: OPEN REDUCTION INTERNAL FIXATION (ORIF) ANKLE FRACTURE;  Surgeon: Vickki Hearing, MD;  Location: [facility];  Service: Orthopedics;  Laterality: Right;  reinsertion of medial malleolar screw   PARTIAL HYSTERECTOMY  2004   One ovary    RECTAL PROLAPSE REPAIR     RECTOCELE REPAIR  05/2001   Right knee arthroscopy  8/05   Right knee replacement  8/05   SHOULDER SURGERY      Current Outpatient Medications  Medication Sig Dispense Refill   cephALEXin (KEFLEX) 500 MG capsule Take 1 capsule (500 mg total) by mouth 2 (two) times daily. Start now in preparation for your cystoscopy on Monday (a quick look in the bladder) (Patient not taking: Reported on 10/24/2021) 6 capsule 0   acetaminophen (TYLENOL 8 HOUR) 650 MG CR tablet Take 1 tablet (650 mg total) by mouth every 8 (eight) hours as needed for pain. 30 tablet 0   albuterol (VENTOLIN HFA) 108 (90 Base) MCG/ACT inhaler Inhale into the lungs every 6 (six) hours as needed for wheezing or shortness of breath.     AMITIZA 24 MCG capsule TAKE ONE CAPSULE BY MOUTH TWICE A DAY (Patient not taking: Reported on 10/24/2021) 60 capsule 5   clonazePAM (KLONOPIN) 1 MG tablet Take 1  mg by mouth 3 (three) times daily as needed.     cyclobenzaprine (FLEXERIL) 10 MG tablet TAKE ONE TABLET BY MOUTH THREE TIMES DAILY AS NEEDED FOR MUSCLE SPASM 90 tablet 5   doxycycline (MONODOX) 100 MG capsule Take 1 capsule (100 mg total) by mouth 2 (two) times daily. 6 capsule 0   doxycycline (VIBRA-TABS) 100 MG tablet Take 1 tablet (100 mg total) by mouth 2 (two) times daily. 20 tablet 0   escitalopram (LEXAPRO) 10 MG tablet Take 10 mg by mouth daily.     estradiol (ESTRACE) 0.5 MG tablet Take 1 tablet by mouth daily.     ezetimibe (ZETIA) 10 MG tablet Take 10 mg by mouth daily.     furosemide (LASIX) 20 MG tablet TAKE ONE (1) TABLET EACH DAY 30 tablet 2   levothyroxine (SYNTHROID, LEVOTHROID) 75 MCG tablet TAKE ONE (1) TABLET EACH DAY 90 tablet 3   LINZESS 290 MCG CAPS  capsule Take 290 mcg by mouth daily.     metoprolol succinate (TOPROL-XL) 50 MG 24 hr tablet TAKE ONE TABLET BY MOUTH TWICE DAILY 180 tablet 0   nitrofurantoin (MACRODANTIN) 50 MG capsule Take 1 capsule (50 mg total) by mouth at bedtime. 60 capsule 0   nitrofurantoin, macrocrystal-monohydrate, (MACROBID) 100 MG capsule Take 1 capsule (100 mg total) by mouth 2 (two) times daily. 10 capsule 0   Omega-3 Fatty Acids (FISH OIL) 1200 MG CAPS Take 1 capsule by mouth 2 (two) times daily.     omeprazole (PRILOSEC) 20 MG capsule TAKE ONE CAPSULE BY MOUTH TWICE A DAY 60 capsule 5   potassium chloride (K-DUR) 10 MEQ tablet TAKE FIVE TABLETS TWICE DAILY 300 tablet 2   RESTASIS 0.05 % ophthalmic emulsion INSTILL ONE DROP IN BOTH EYES TWICE DAILY 60 each 11   rosuvastatin (CRESTOR) 10 MG tablet Take 10 mg by mouth at bedtime.     traZODone (DESYREL) 50 MG tablet Take 50 mg by mouth at bedtime.     VASCEPA 1 g capsule Take 2 g by mouth 2 (two) times daily.     venlafaxine XR (EFFEXOR-XR) 75 MG 24 hr capsule TAKE 3 CAPSULES EVERY MORNING 90 capsule 1   No current facility-administered medications for this visit.   Allergies:  Clindamycin, Diphenhydramine, Diphenhydramine hcl, and Naproxen   Social History: The patient  reports that she has never smoked. She has never used smokeless tobacco. She reports that she does not drink alcohol and does not use drugs.   Family History: The patient's family history includes Breast cancer in her mother and sister; Coronary artery disease in an other family member; Heart attack in an other family member.   ROS:  Please see the history of present illness. Otherwise, complete review of systems is positive for {NONE DEFAULTED:18576}.  All other systems are reviewed and negative.   Physical Exam: VS:  There were no vitals taken for this visit., BMI There is no height or weight on file to calculate BMI.  Wt Readings from Last 3 Encounters:  08/25/20 244 lb (110.7 kg)   06/26/19 235 lb (106.6 kg)  05/03/18 241 lb 9.6 oz (109.6 kg)    General: Patient appears comfortable at rest. HEENT: Conjunctiva and lids normal, oropharynx clear with moist mucosa. Neck: Supple, no elevated JVP or carotid bruits, no thyromegaly. Lungs: Clear to auscultation, nonlabored breathing at rest. Cardiac: Regular rate and rhythm, no S3 or significant systolic murmur, no pericardial rub. Abdomen: Soft, nontender, no hepatomegaly, bowel sounds present, no  guarding or rebound. Extremities: No pitting edema, distal pulses 2+. Skin: Warm and dry. Musculoskeletal: No kyphosis. Neuropsychiatric: Alert and oriented x3, affect grossly appropriate.  ECG:  An ECG dated 08/25/2020 was personally reviewed today and demonstrated:  Sinus rhythm with right bundle branch block and left anterior fascicular block.  Recent Labwork:  September 2023: Hemoglobin 14.4, platelets 275, BUN 17, creatinine 1.05, potassium 4.3, AST 24, ALT 21, cholesterol 189, triglycerides 205, HDL 59, LDL 95  Other Studies Reviewed Today:  Echocardiogram 05/07/2018: - Left ventricle: The cavity size was normal. Wall thickness was    normal. Systolic function was normal. The estimated ejection    fraction was in the range of 55% to 60%. Wall motion was normal;    there were no regional wall motion abnormalities. Left    ventricular diastolic function parameters were normal.  - Atrial septum: No defect or patent foramen ovale was identified.   Assessment and Plan:    Medication Adjustments/Labs and Tests Ordered: Current medicines are reviewed at length with the patient today.  Concerns regarding medicines are outlined above.   Tests Ordered: No orders of the defined types were placed in this encounter.   Medication Changes: No orders of the defined types were placed in this encounter.   Disposition:  Follow up {follow up:15908}  Signed, Jonelle Sidle, MD, [facility] 04/24/2022 10:16 AM    [facility] at [facility] [address] [place], [place], [place] [address] Phone: [phone]; Fax: [phone]

## 2022-05-11 ENCOUNTER — Other Ambulatory Visit (HOSPITAL_COMMUNITY): Payer: Self-pay | Admitting: Internal Medicine

## 2022-05-11 DIAGNOSIS — Z1231 Encounter for screening mammogram for malignant neoplasm of breast: Secondary | ICD-10-CM

## 2022-07-07 ENCOUNTER — Ambulatory Visit (HOSPITAL_COMMUNITY): Payer: Medicare Other

## 2022-07-11 ENCOUNTER — Ambulatory Visit: Payer: Medicare Other | Admitting: Cardiology

## 2022-07-11 NOTE — Progress Notes (Deleted)
Cardiology Office Note  Date: 07/11/2022   ID: [name], DOB [birth year], MRN [id number]  PCP:  Celene Squibb, MD  Cardiologist:  Rozann Lesches, MD Electrophysiologist:  None   No chief complaint on file.   History of Present Illness: [name] is a 72 y.o. female last seen in March 2022.  Past Medical History:  Diagnosis Date   Anxiety    Arthritis    Borderline hypertension    Chronic lower back pain    Chronic shoulder pain    Depression    Fe deficiency anemia    GERD (gastroesophageal reflux disease)    Heartburn    Hiatal hernia    History of cardiac catheterization    Normal coronaries   History of mixed drug abuse ([place])    Hyperlipidemia    Hypertension    Hypothyroidism    Dr. Derrill Memo    Lumbar herniated disc    L4-L5    Microscopic hematuria    Obesity    Personal history of venous thrombosis and embolism    PSVT (paroxysmal supraventricular tachycardia) ([place]) 8/05   Recurrent cystitis    Dr. Serita Butcher     Current Outpatient Medications  Medication Sig Dispense Refill   cephALEXin (KEFLEX) 500 MG capsule Take 1 capsule (500 mg total) by mouth 2 (two) times daily. Start now in preparation for your cystoscopy on Monday (a quick look in the bladder) (Patient not taking: Reported on 10/24/2021) 6 capsule 0   acetaminophen (TYLENOL 8 HOUR) 650 MG CR tablet Take 1 tablet (650 mg total) by mouth every 8 (eight) hours as needed for pain. 30 tablet 0   albuterol (VENTOLIN HFA) 108 (90 Base) MCG/ACT inhaler Inhale into the lungs every 6 (six) hours as needed for wheezing or shortness of breath.     AMITIZA 24 MCG capsule TAKE ONE CAPSULE BY MOUTH TWICE A DAY (Patient not taking: Reported on 10/24/2021) 60 capsule 5   clonazePAM (KLONOPIN) 1 MG tablet Take 1 mg by mouth 3 (three) times daily as needed.     cyclobenzaprine (FLEXERIL) 10 MG tablet TAKE ONE TABLET BY MOUTH THREE TIMES DAILY AS NEEDED FOR MUSCLE SPASM 90 tablet 5   doxycycline (MONODOX) 100 MG  capsule Take 1 capsule (100 mg total) by mouth 2 (two) times daily. 6 capsule 0   doxycycline (VIBRA-TABS) 100 MG tablet Take 1 tablet (100 mg total) by mouth 2 (two) times daily. 20 tablet 0   escitalopram (LEXAPRO) 10 MG tablet Take 10 mg by mouth daily.     estradiol (ESTRACE) 0.5 MG tablet Take 1 tablet by mouth daily.     ezetimibe (ZETIA) 10 MG tablet Take 10 mg by mouth daily.     furosemide (LASIX) 20 MG tablet TAKE ONE (1) TABLET EACH DAY 30 tablet 2   levothyroxine (SYNTHROID, LEVOTHROID) 75 MCG tablet TAKE ONE (1) TABLET EACH DAY 90 tablet 3   LINZESS 290 MCG CAPS capsule Take 290 mcg by mouth daily.     metoprolol succinate (TOPROL-XL) 50 MG 24 hr tablet TAKE ONE TABLET BY MOUTH TWICE DAILY 180 tablet 0   nitrofurantoin (MACRODANTIN) 50 MG capsule Take 1 capsule (50 mg total) by mouth at bedtime. 60 capsule 0   nitrofurantoin, macrocrystal-monohydrate, (MACROBID) 100 MG capsule Take 1 capsule (100 mg total) by mouth 2 (two) times daily. 10 capsule 0   Omega-3 Fatty Acids (FISH OIL) 1200 MG CAPS Take 1 capsule by mouth 2 (two) times daily.  omeprazole (PRILOSEC) 20 MG capsule TAKE ONE CAPSULE BY MOUTH TWICE A DAY 60 capsule 5   potassium chloride (K-DUR) 10 MEQ tablet TAKE FIVE TABLETS TWICE DAILY 300 tablet 2   RESTASIS 0.05 % ophthalmic emulsion INSTILL ONE DROP IN BOTH EYES TWICE DAILY 60 each 11   rosuvastatin (CRESTOR) 10 MG tablet Take 10 mg by mouth at bedtime.     traZODone (DESYREL) 50 MG tablet Take 50 mg by mouth at bedtime.     VASCEPA 1 g capsule Take 2 g by mouth 2 (two) times daily.     venlafaxine XR (EFFEXOR-XR) 75 MG 24 hr capsule TAKE 3 CAPSULES EVERY MORNING 90 capsule 1   No current facility-administered medications for this visit.   Allergies:  Clindamycin, Diphenhydramine, Diphenhydramine hcl, and Naproxen   ROS:  Please see the history of present illness. Otherwise, complete review of systems is positive for {NONE DEFAULTED:18576}.  All other systems are  reviewed and negative.   Physical Exam: VS:  There were no vitals taken for this visit., BMI There is no height or weight on file to calculate BMI.  Wt Readings from Last 3 Encounters:  08/25/20 244 lb (110.7 kg)  06/26/19 235 lb (106.6 kg)  05/03/18 241 lb 9.6 oz (109.6 kg)    General: Patient appears comfortable at rest. HEENT: Conjunctiva and lids normal, oropharynx clear with moist mucosa. Neck: Supple, no elevated JVP or carotid bruits, no thyromegaly. Lungs: Clear to auscultation, nonlabored breathing at rest. Cardiac: Regular rate and rhythm, no S3 or significant systolic murmur, no pericardial rub. Abdomen: Soft, nontender, no hepatomegaly, bowel sounds present, no guarding or rebound. Extremities: No pitting edema, distal pulses 2+. Skin: Warm and dry. Musculoskeletal: No kyphosis. Neuropsychiatric: Alert and oriented x3, affect grossly appropriate.  ECG:  An ECG dated 08/25/2020 was personally reviewed today and demonstrated:  Sinus rhythm with right bundle branch block and left anterior fascicular block.  Recent Labwork:  September 2023: Hemoglobin 14.4, platelets 275, BUN 17, creatinine 1.05, potassium 4.3, AST 24, ALT 21, cholesterol 189, triglycerides 205, HDL 59, LDL 95  Other Studies Reviewed Today:  Echocardiogram 05/07/2018: - Left ventricle: The cavity size was normal. Wall thickness was    normal. Systolic function was normal. The estimated ejection    fraction was in the range of 55% to 60%. Wall motion was normal;    there were no regional wall motion abnormalities. Left    ventricular diastolic function parameters were normal.  - Atrial septum: No defect or patent foramen ovale was identified.   Assessment and Plan:   Medication Adjustments/Labs and Tests Ordered: Current medicines are reviewed at length with the patient today.  Concerns regarding medicines are outlined above.   Tests Ordered: No orders of the defined types were placed in this  encounter.   Medication Changes: No orders of the defined types were placed in this encounter.   Disposition:  Follow up {follow up:15908}  Signed, Satira Sark, MD, [facility] 07/11/2022 8:10 AM    [facility] at [facility] [address] [place], [place] [address] Phone: [phone]; Fax: [phone]

## 2022-07-17 ENCOUNTER — Ambulatory Visit (HOSPITAL_COMMUNITY): Payer: Medicare Other

## 2022-07-21 ENCOUNTER — Ambulatory Visit: Payer: Medicare Other | Attending: Cardiology | Admitting: Nurse Practitioner

## 2022-07-21 ENCOUNTER — Ambulatory Visit (HOSPITAL_COMMUNITY): Payer: Medicare Other

## 2022-07-21 NOTE — Progress Notes (Deleted)
Cardiology Office Note:    Date:  07/21/2022   ID:  [name], DOB 20-Jul-1950, MRN [id number]  PCP:  Celene Squibb, MD   [facility] Providers Cardiologist:  Rozann Lesches, MD { Click to update primary MD,subspecialty MD or APP then REFRESH:1}    Referring MD: Celene Squibb, MD   No chief complaint on file. ***  History of Present Illness:    [name] is a 72 y.o. female with a hx of ***  PSVT Hypertension Hypothyroidism Hyperlipidemia Hiatal hernia History of recurrent cystitis  Patient is a 72 year old female past medical history as mentioned above.  Last seen by Dr. Domenic Polite on August 25, 2020.  She felt a brief episode of palpitations, denied any prolonged palpitations or syncope.  Was tolerating her medications well.  Denied any other acute cardiac complaints or issues.  Was told to follow-up in 1 year.  Today she presents for follow-up.  She states. Past Medical History:  Diagnosis Date   Anxiety    Arthritis    Borderline hypertension    Chronic lower back pain    Chronic shoulder pain    Depression    Fe deficiency anemia    GERD (gastroesophageal reflux disease)    Heartburn    Hiatal hernia    History of cardiac catheterization    Normal coronaries   History of mixed drug abuse ([place])    Hyperlipidemia    Hypertension    Hypothyroidism    Dr. Derrill Memo    Lumbar herniated disc    L4-L5    Microscopic hematuria    Obesity    Personal history of venous thrombosis and embolism    PSVT (paroxysmal supraventricular tachycardia) ([place]) 8/05   Recurrent cystitis    Dr. Serita Butcher     Past Surgical History:  Procedure Laterality Date   APPENDECTOMY  2004?or 11/03?   BACK SURGERY     BLADDER REPAIR     CARPAL TUNNEL RELEASE     x2    HARDWARE REMOVAL Right 02/18/2016   Procedure: HARDWARE REMOVAL;  Surgeon: Carole Civil, MD;  Location: [facility];  Service: Orthopedics;  Laterality: Right;  right ankle medial malleolar screw   HARDWARE  REMOVAL Right 03/08/2016   Procedure: HARDWARE REMOVAL AND INTERNAL FIXATION OF RIGHT MEDIAL MALLEOLUS  - RIGHT ANKLE REMOVAL OF MALLEOLAR SCREW, INSERTION OF 2  0.45 K WIRES;  Surgeon: Carole Civil, MD;  Location: [facility];  Service: Orthopedics;  Laterality: Right;  RIGHT ANKLE REMOVAL OF MALLEOLAR SCREW   LIVER BIOPSY  2002   Dr. Tamala Julian    ORIF ANKLE FRACTURE Right 02/02/2016   Procedure: OPEN REDUCTION INTERNAL FIXATION (ORIF) ANKLE FRACTURE;  Surgeon: Carole Civil, MD;  Location: [facility];  Service: Orthopedics;  Laterality: Right;   ORIF ANKLE FRACTURE Right 02/18/2016   Procedure: OPEN REDUCTION INTERNAL FIXATION (ORIF) ANKLE FRACTURE;  Surgeon: Carole Civil, MD;  Location: [facility];  Service: Orthopedics;  Laterality: Right;  reinsertion of medial malleolar screw   PARTIAL HYSTERECTOMY  2004   One ovary    RECTAL PROLAPSE REPAIR     RECTOCELE REPAIR  05/2001   Right knee arthroscopy  8/05   Right knee replacement  8/05   SHOULDER SURGERY      Current Medications: No outpatient medications have been marked as taking for the 07/21/22 encounter (Appointment) with Finis Bud, NP.     Allergies:   Clindamycin, Diphenhydramine, Diphenhydramine hcl, and Naproxen   Social  History   Socioeconomic History   Marital status: Single    Spouse name: Not on file   Number of children: 5   Years of education: Not on file   Highest education level: Not on file  Occupational History   Occupation: Disabled     Employer: UNEMPLOYED  Tobacco Use   Smoking status: Never   Smokeless tobacco: Never  Substance and Sexual Activity   Alcohol use: No    Alcohol/week: 0.0 standard drinks of alcohol   Drug use: No    Comment: history of abuse    Sexual activity: Not on file  Other Topics Concern   Not on file  Social History Narrative   Not on file   Social Determinants of Health   Financial Resource Strain: Not on file  Food Insecurity: Not on file  Transportation Needs: Not  on file  Physical Activity: Not on file  Stress: Not on file  Social Connections: Not on file     Family History: The patient's ***family history includes Breast cancer in her mother and sister; Coronary artery disease in an other family member; Heart attack in an other family member.  ROS:   Please see the history of present illness.    *** All other systems reviewed and are negative.  EKGs/Labs/Other Studies Reviewed:    The following studies were reviewed today: ***  EKG:  EKG is *** ordered today.  The ekg ordered today demonstrates ***  Recent Labs: No results found for requested labs within last 365 days.  Recent Lipid Panel    Component Value Date/Time   CHOL 326 (H) 07/05/2016 1459   CHOL 166 01/02/2013 1146   TRIG 202 (H) 07/05/2016 1459   TRIG 122 10/26/2014 1655   TRIG 103 01/02/2013 1146   HDL 60 07/05/2016 1459   HDL 72 10/26/2014 1655   HDL 75 01/02/2013 1146   CHOLHDL 5.4 (H) 07/05/2016 1459   LDLCALC 226 (H) 07/05/2016 1459   LDLCALC 57 12/09/2013 1048   LDLCALC 70 01/02/2013 1146     Risk Assessment/Calculations:   {Does this patient have ATRIAL FIBRILLATION?[phone]}  No BP recorded.  {Refresh Note OR Click here to enter BP  :1}***         Physical Exam:    VS:  There were no vitals taken for this visit.    Wt Readings from Last 3 Encounters:  08/25/20 244 lb (110.7 kg)  06/26/19 235 lb (106.6 kg)  05/03/18 241 lb 9.6 oz (109.6 kg)     GEN: *** Well nourished, well developed in no acute distress HEENT: Normal NECK: No JVD; No carotid bruits LYMPHATICS: No lymphadenopathy CARDIAC: ***RRR, no murmurs, rubs, gallops RESPIRATORY:  Clear to auscultation without rales, wheezing or rhonchi  ABDOMEN: Soft, non-tender, non-distended MUSCULOSKELETAL:  No edema; No deformity  SKIN: Warm and dry NEUROLOGIC:  Alert and oriented x 3 PSYCHIATRIC:  Normal affect   ASSESSMENT:    No diagnosis found. PLAN:    In order of problems listed  above:  ***      {Are you ordering a CV Procedure (e.g. stress test, cath, DCCV, TEE, etc)?   Press F2        :[id number]    Medication Adjustments/Labs and Tests Ordered: Current medicines are reviewed at length with the patient today.  Concerns regarding medicines are outlined above.  No orders of the defined types were placed in this encounter.  No orders of the defined types were placed in this encounter.  There are no Patient Instructions on file for this visit.   Signed, Finis Bud, NP  07/21/2022 1:01 PM    [facility]

## 2022-07-24 ENCOUNTER — Encounter: Payer: Self-pay | Admitting: Nurse Practitioner

## 2022-07-27 ENCOUNTER — Ambulatory Visit: Payer: Medicare Other | Admitting: Nurse Practitioner

## 2022-07-27 NOTE — Progress Notes (Deleted)
Cardiology Office Note:    Date:  07/27/2022   ID:  [name], DOB [birth year], MRN [id number]  PCP:  Celene Squibb, MD   [facility] Providers Cardiologist:  Rozann Lesches, MD { Click to update primary MD,subspecialty MD or APP then REFRESH:1}    Referring MD: Celene Squibb, MD   No chief complaint on file. ***  History of Present Illness:    [name] is a 72 y.o. female with a hx of ***  PSVT Hypertension History of mixed drug abuse Anxiety, depression GERD  Patient is a 72 year old female with past medical history as mentioned above.  Echocardiogram in 2019 revealed normal EF, no significant valvular abnormalities.  Last seen by Dr. Domenic Polite on August 25, 2020.  Patient noted of brief episode of palpitations, denied any prolonged events, denied syncope.  EKG in office revealed sinus rhythm, right bundle branch block, left anterior fascicular block.  She was tolerating her medications well.  No medication changes were made.  Was told to follow-up in 1 year.  Today she presents for follow-up.  She states...  Past Medical History:  Diagnosis Date   Anxiety    Arthritis    Borderline hypertension    Chronic lower back pain    Chronic shoulder pain    Depression    Fe deficiency anemia    GERD (gastroesophageal reflux disease)    Heartburn    Hiatal hernia    History of cardiac catheterization    Normal coronaries   History of mixed drug abuse ([place])    Hyperlipidemia    Hypertension    Hypothyroidism    Dr. Derrill Memo    Lumbar herniated disc    L4-L5    Microscopic hematuria    Obesity    Personal history of venous thrombosis and embolism    PSVT (paroxysmal supraventricular tachycardia) ([place]) 8/05   Recurrent cystitis    Dr. Serita Butcher     Past Surgical History:  Procedure Laterality Date   APPENDECTOMY  2004?or 11/03?   BACK SURGERY     BLADDER REPAIR     CARPAL TUNNEL RELEASE     x2    HARDWARE REMOVAL Right 02/18/2016   Procedure:  HARDWARE REMOVAL;  Surgeon: Carole Civil, MD;  Location: [facility];  Service: Orthopedics;  Laterality: Right;  right ankle medial malleolar screw   HARDWARE REMOVAL Right 03/08/2016   Procedure: HARDWARE REMOVAL AND INTERNAL FIXATION OF RIGHT MEDIAL MALLEOLUS  - RIGHT ANKLE REMOVAL OF MALLEOLAR SCREW, INSERTION OF 2  0.45 K WIRES;  Surgeon: Carole Civil, MD;  Location: [facility];  Service: Orthopedics;  Laterality: Right;  RIGHT ANKLE REMOVAL OF MALLEOLAR SCREW   LIVER BIOPSY  2002   Dr. Tamala Julian    ORIF ANKLE FRACTURE Right 02/02/2016   Procedure: OPEN REDUCTION INTERNAL FIXATION (ORIF) ANKLE FRACTURE;  Surgeon: Carole Civil, MD;  Location: [facility];  Service: Orthopedics;  Laterality: Right;   ORIF ANKLE FRACTURE Right 02/18/2016   Procedure: OPEN REDUCTION INTERNAL FIXATION (ORIF) ANKLE FRACTURE;  Surgeon: Carole Civil, MD;  Location: [facility];  Service: Orthopedics;  Laterality: Right;  reinsertion of medial malleolar screw   PARTIAL HYSTERECTOMY  2004   One ovary    RECTAL PROLAPSE REPAIR     RECTOCELE REPAIR  05/2001   Right knee arthroscopy  8/05   Right knee replacement  8/05   SHOULDER SURGERY      Current Medications: No outpatient medications have been marked as  taking for the 07/27/22 encounter (Appointment) with Finis Bud, NP.     Allergies:   Clindamycin, Diphenhydramine, Diphenhydramine hcl, and Naproxen   Social History   Socioeconomic History   Marital status: Single    Spouse name: Not on file   Number of children: 5   Years of education: Not on file   Highest education level: Not on file  Occupational History   Occupation: Disabled     Employer: UNEMPLOYED  Tobacco Use   Smoking status: Never   Smokeless tobacco: Never  Substance and Sexual Activity   Alcohol use: No    Alcohol/week: 0.0 standard drinks of alcohol   Drug use: No    Comment: history of abuse    Sexual activity: Not on file  Other Topics Concern   Not on file  Social History  Narrative   Not on file   Social Determinants of Health   Financial Resource Strain: Not on file  Food Insecurity: Not on file  Transportation Needs: Not on file  Physical Activity: Not on file  Stress: Not on file  Social Connections: Not on file     Family History: The patient's ***family history includes Breast cancer in her mother and sister; Coronary artery disease in an other family member; Heart attack in an other family member.  ROS:   Please see the history of present illness.    *** All other systems reviewed and are negative.  EKGs/Labs/Other Studies Reviewed:    The following studies were reviewed today: ***  EKG:  EKG is *** ordered today.  The ekg ordered today demonstrates ***  Recent Labs: No results found for requested labs within last 365 days.  Recent Lipid Panel    Component Value Date/Time   CHOL 326 (H) 07/05/2016 1459   CHOL 166 01/02/2013 1146   TRIG 202 (H) 07/05/2016 1459   TRIG 122 10/26/2014 1655   TRIG 103 01/02/2013 1146   HDL 60 07/05/2016 1459   HDL 72 10/26/2014 1655   HDL 75 01/02/2013 1146   CHOLHDL 5.4 (H) 07/05/2016 1459   LDLCALC 226 (H) 07/05/2016 1459   LDLCALC 57 12/09/2013 1048   LDLCALC 70 01/02/2013 1146     Risk Assessment/Calculations:   {Does this patient have ATRIAL FIBRILLATION?:[phone]}  No BP recorded.  {Refresh Note OR Click here to enter BP  :1}***         Physical Exam:    VS:  There were no vitals taken for this visit.    Wt Readings from Last 3 Encounters:  08/25/20 244 lb (110.7 kg)  06/26/19 235 lb (106.6 kg)  05/03/18 241 lb 9.6 oz (109.6 kg)     GEN: *** Well nourished, well developed in no acute distress HEENT: Normal NECK: No JVD; No carotid bruits LYMPHATICS: No lymphadenopathy CARDIAC: ***RRR, no murmurs, rubs, gallops RESPIRATORY:  Clear to auscultation without rales, wheezing or rhonchi  ABDOMEN: Soft, non-tender, non-distended MUSCULOSKELETAL:  No edema; No deformity  SKIN:  Warm and dry NEUROLOGIC:  Alert and oriented x 3 PSYCHIATRIC:  Normal affect   ASSESSMENT:    No diagnosis found. PLAN:    In order of problems listed above:  ***      {Are you ordering a CV Procedure (e.g. stress test, cath, DCCV, TEE, etc)?   Press F2        :[id number]    Medication Adjustments/Labs and Tests Ordered: Current medicines are reviewed at length with the patient today.  Concerns regarding medicines are  outlined above.  No orders of the defined types were placed in this encounter.  No orders of the defined types were placed in this encounter.   There are no Patient Instructions on file for this visit.   Signed, Finis Bud, NP  07/27/2022 1:16 PM    [facility]

## 2022-07-28 ENCOUNTER — Encounter: Payer: Self-pay | Admitting: Nurse Practitioner

## 2022-07-31 ENCOUNTER — Ambulatory Visit: Payer: Medicare Other | Admitting: Nurse Practitioner

## 2022-07-31 ENCOUNTER — Ambulatory Visit (HOSPITAL_COMMUNITY): Payer: Medicare Other

## 2022-07-31 NOTE — Progress Notes (Deleted)
Cardiology Office Note:    Date:  07/31/2022   ID:  [name], DOB Nov 04, 1950, MRN [id number]  PCP:  Celene Squibb, MD   [place] Providers Cardiologist:  Rozann Lesches, MD { Click to update primary MD,subspecialty MD or APP then REFRESH:1}    Referring MD: Celene Squibb, MD   No chief complaint on file. ***  History of Present Illness:    [name] is a 72 y.o. female with a hx of ***  PSVT Hypertension History of mixed drug abuse Anxiety, depression GERD  Patient is a 72 year old female with past medical history as mentioned above.  Echocardiogram in 2019 revealed normal EF, no significant valvular abnormalities.  Last seen by Dr. Domenic Polite on August 25, 2020.  Patient noted of brief episode of palpitations, denied any prolonged events, denied syncope.  EKG in office revealed sinus rhythm, right bundle branch block, left anterior fascicular block.  She was tolerating her medications well.  No medication changes were made.  Was told to follow-up in 1 year.  Today she presents for follow-up.  She states...  Past Medical History:  Diagnosis Date   Anxiety    Arthritis    Borderline hypertension    Chronic lower back pain    Chronic shoulder pain    Depression    Fe deficiency anemia    GERD (gastroesophageal reflux disease)    Heartburn    Hiatal hernia    History of cardiac catheterization    Normal coronaries   History of mixed drug abuse ([place])    Hyperlipidemia    Hypertension    Hypothyroidism    Dr. Derrill Memo    Lumbar herniated disc    L4-L5    Microscopic hematuria    Obesity    Personal history of venous thrombosis and embolism    PSVT (paroxysmal supraventricular tachycardia) ([place]) 8/05   Recurrent cystitis    Dr. Serita Butcher     Past Surgical History:  Procedure Laterality Date   APPENDECTOMY  2004?or 11/03?   BACK SURGERY     BLADDER REPAIR     CARPAL TUNNEL RELEASE     x2    HARDWARE REMOVAL Right 02/18/2016   Procedure:  HARDWARE REMOVAL;  Surgeon: Carole Civil, MD;  Location: [facility];  Service: Orthopedics;  Laterality: Right;  right ankle medial malleolar screw   HARDWARE REMOVAL Right 03/08/2016   Procedure: HARDWARE REMOVAL AND INTERNAL FIXATION OF RIGHT MEDIAL MALLEOLUS  - RIGHT ANKLE REMOVAL OF MALLEOLAR SCREW, INSERTION OF 2  0.45 K WIRES;  Surgeon: Carole Civil, MD;  Location: [facility];  Service: Orthopedics;  Laterality: Right;  RIGHT ANKLE REMOVAL OF MALLEOLAR SCREW   LIVER BIOPSY  2002   Dr. Tamala Julian    ORIF ANKLE FRACTURE Right 02/02/2016   Procedure: OPEN REDUCTION INTERNAL FIXATION (ORIF) ANKLE FRACTURE;  Surgeon: Carole Civil, MD;  Location: [facility];  Service: Orthopedics;  Laterality: Right;   ORIF ANKLE FRACTURE Right 02/18/2016   Procedure: OPEN REDUCTION INTERNAL FIXATION (ORIF) ANKLE FRACTURE;  Surgeon: Carole Civil, MD;  Location: [facility];  Service: Orthopedics;  Laterality: Right;  reinsertion of medial malleolar screw   PARTIAL HYSTERECTOMY  2004   One ovary    RECTAL PROLAPSE REPAIR     RECTOCELE REPAIR  05/2001   Right knee arthroscopy  8/05   Right knee replacement  8/05   SHOULDER SURGERY      Current Medications: No outpatient medications have been marked as  taking for the 07/31/22 encounter (Appointment) with Finis Bud, NP.     Allergies:   Clindamycin, Diphenhydramine, Diphenhydramine hcl, and Naproxen   Social History   Socioeconomic History   Marital status: Single    Spouse name: Not on file   Number of children: 5   Years of education: Not on file   Highest education level: Not on file  Occupational History   Occupation: Disabled     Employer: UNEMPLOYED  Tobacco Use   Smoking status: Never   Smokeless tobacco: Never  Substance and Sexual Activity   Alcohol use: No    Alcohol/week: 0.0 standard drinks of alcohol   Drug use: No    Comment: history of abuse    Sexual activity: Not on file  Other Topics Concern   Not on file  Social History  Narrative   Not on file   Social Determinants of Health   Financial Resource Strain: Not on file  Food Insecurity: Not on file  Transportation Needs: Not on file  Physical Activity: Not on file  Stress: Not on file  Social Connections: Not on file     Family History: The patient's ***family history includes Breast cancer in her mother and sister; Coronary artery disease in an other family member; Heart attack in an other family member.  ROS:   Please see the history of present illness.    *** All other systems reviewed and are negative.  EKGs/Labs/Other Studies Reviewed:    The following studies were reviewed today: ***  EKG:  EKG is *** ordered today.  The ekg ordered today demonstrates ***  Recent Labs: No results found for requested labs within last 365 days.  Recent Lipid Panel    Component Value Date/Time   CHOL 326 (H) 07/05/2016 1459   CHOL 166 01/02/2013 1146   TRIG 202 (H) 07/05/2016 1459   TRIG 122 10/26/2014 1655   TRIG 103 01/02/2013 1146   HDL 60 07/05/2016 1459   HDL 72 10/26/2014 1655   HDL 75 01/02/2013 1146   CHOLHDL 5.4 (H) 07/05/2016 1459   LDLCALC 226 (H) 07/05/2016 1459   LDLCALC 57 12/09/2013 1048   LDLCALC 70 01/02/2013 1146     Risk Assessment/Calculations:   {Does this patient have ATRIAL FIBRILLATION?:[phone]}  No BP recorded.  {Refresh Note OR Click here to enter BP  :1}***         Physical Exam:    VS:  There were no vitals taken for this visit.    Wt Readings from Last 3 Encounters:  08/25/20 244 lb (110.7 kg)  06/26/19 235 lb (106.6 kg)  05/03/18 241 lb 9.6 oz (109.6 kg)     GEN: *** Well nourished, well developed in no acute distress HEENT: Normal NECK: No JVD; No carotid bruits LYMPHATICS: No lymphadenopathy CARDIAC: ***RRR, no murmurs, rubs, gallops RESPIRATORY:  Clear to auscultation without rales, wheezing or rhonchi  ABDOMEN: Soft, non-tender, non-distended MUSCULOSKELETAL:  No edema; No deformity  SKIN:  Warm and dry NEUROLOGIC:  Alert and oriented x 3 PSYCHIATRIC:  Normal affect   ASSESSMENT:    No diagnosis found. PLAN:    In order of problems listed above:  ***      {Are you ordering a CV Procedure (e.g. stress test, cath, DCCV, TEE, etc)?   Press F2        :[id number]    Medication Adjustments/Labs and Tests Ordered: Current medicines are reviewed at length with the patient today.  Concerns regarding medicines are  outlined above.  No orders of the defined types were placed in this encounter.  No orders of the defined types were placed in this encounter.   There are no Patient Instructions on file for this visit.   Signed, Finis Bud, NP  07/31/2022 7:49 AM    [place]

## 2022-08-02 ENCOUNTER — Ambulatory Visit: Payer: Medicare Other | Attending: Nurse Practitioner | Admitting: Cardiology

## 2022-08-02 ENCOUNTER — Encounter: Payer: Self-pay | Admitting: Cardiology

## 2022-08-02 VITALS — BP 120/76 | HR 80 | Ht 65.0 in | Wt 236.0 lb

## 2022-08-02 DIAGNOSIS — I471 Supraventricular tachycardia, unspecified: Secondary | ICD-10-CM | POA: Diagnosis not present

## 2022-08-02 DIAGNOSIS — I10 Essential (primary) hypertension: Secondary | ICD-10-CM | POA: Diagnosis not present

## 2022-08-02 NOTE — Progress Notes (Signed)
Cardiology Office Note  Date: 08/02/2022   ID: [name], DOB [birth year], MRN [id number]  PCP:  Celene Squibb, MD  Cardiologist:  Rozann Lesches, MD Electrophysiologist:  None   Chief Complaint  Patient presents with   Cardiac follow-up    History of Present Illness: [name] is a 72 y.o. female last seen in March 2022.  She is here for a follow-up visit.  From a cardiac perspective, she does not report any obvious palpitations, no chest pain or syncope.  She did have a fall in the last 6 months, but it sounds like this was more of a mechanical fall when she was walking outdoors, she reports no loss of consciousness.  She continues to follow with Dr. Nevada Crane, I reviewed her lab [name] from September 2023 as noted below.  She remains on Toprol-XL 50 mg twice daily.  I personally reviewed her ECG today which shows sinus rhythm with right bundle branch block.  Blood pressure is well-controlled today.   Past Medical History:  Diagnosis Date   Anxiety    Arthritis    Borderline hypertension    Chronic lower back pain    Chronic shoulder pain    Depression    Fe deficiency anemia    GERD (gastroesophageal reflux disease)    Heartburn    Hiatal hernia    History of cardiac catheterization    Normal coronaries   History of mixed drug abuse ([place])    Hyperlipidemia    Hypertension    Hypothyroidism    Dr. Derrill Memo    Lumbar herniated disc    L4-L5    Microscopic hematuria    Obesity    Personal history of venous thrombosis and embolism    PSVT (paroxysmal supraventricular tachycardia) 8/05   Recurrent cystitis    Dr. Serita Butcher     Current Outpatient Medications  Medication Sig Dispense Refill   acetaminophen (TYLENOL 8 HOUR) 650 MG CR tablet Take 1 tablet (650 mg total) by mouth every 8 (eight) hours as needed for pain. 30 tablet 0   albuterol (VENTOLIN HFA) 108 (90 Base) MCG/ACT inhaler Inhale into the lungs every 6 (six) hours as needed for wheezing or shortness of  breath.     clonazePAM (KLONOPIN) 1 MG tablet Take 1 mg by mouth 3 (three) times daily as needed.     cyclobenzaprine (FLEXERIL) 10 MG tablet TAKE ONE TABLET BY MOUTH THREE TIMES DAILY AS NEEDED FOR MUSCLE SPASM 90 tablet 5   escitalopram (LEXAPRO) 10 MG tablet Take 10 mg by mouth daily.     estradiol (ESTRACE) 0.5 MG tablet Take 1 tablet by mouth daily.     ezetimibe (ZETIA) 10 MG tablet Take 10 mg by mouth daily.     furosemide (LASIX) 20 MG tablet TAKE ONE (1) TABLET EACH DAY 30 tablet 2   levothyroxine (SYNTHROID, LEVOTHROID) 75 MCG tablet TAKE ONE (1) TABLET EACH DAY 90 tablet 3   LINZESS 290 MCG CAPS capsule Take 290 mcg by mouth daily.     metoprolol succinate (TOPROL-XL) 50 MG 24 hr tablet TAKE ONE TABLET BY MOUTH TWICE DAILY 180 tablet 0   Omega-3 Fatty Acids (FISH OIL) 1200 MG CAPS Take 1 capsule by mouth 2 (two) times daily.     omeprazole (PRILOSEC) 20 MG capsule TAKE ONE CAPSULE BY MOUTH TWICE A DAY 60 capsule 5   potassium chloride (K-DUR) 10 MEQ tablet TAKE FIVE TABLETS TWICE DAILY 300 tablet 2   RESTASIS 0.05 % ophthalmic  emulsion INSTILL ONE DROP IN BOTH EYES TWICE DAILY 60 each 11   rosuvastatin (CRESTOR) 10 MG tablet Take 10 mg by mouth at bedtime.     traZODone (DESYREL) 50 MG tablet Take 50 mg by mouth at bedtime.     VASCEPA 1 g capsule Take 2 g by mouth 2 (two) times daily.     venlafaxine XR (EFFEXOR-XR) 75 MG 24 hr capsule TAKE 3 CAPSULES EVERY MORNING 90 capsule 1   No current facility-administered medications for this visit.   Allergies:  Clindamycin, Diphenhydramine, Diphenhydramine hcl, and Naproxen   ROS:  No syncope.  Physical Exam: VS:  BP 120/76   Pulse 80   Ht 5' 5"$  (1.651 m)   Wt 236 lb (107 kg)   SpO2 98%   BMI 39.27 kg/m , BMI Body mass index is 39.27 kg/m.  Wt Readings from Last 3 Encounters:  08/02/22 236 lb (107 kg)  08/25/20 244 lb (110.7 kg)  06/26/19 235 lb (106.6 kg)    General: Patient appears comfortable at rest. HEENT:  Conjunctiva and lids normal. Neck: Supple, no elevated JVP or carotid bruits. Lungs: Clear to auscultation, nonlabored breathing at rest. Cardiac: Regular rate and rhythm, no S3 or significant systolic murmur.   ECG:  An ECG dated 08/25/2020 was personally reviewed today and demonstrated:  Rhythm with right bundle branch block and left anterior fascicular block.  Recent Labwork:  September 2023: Hemoglobin 14.4, platelets 275, BUN 17, creatinine 1.05, potassium 4.3, AST 24, ALT 21, cholesterol 189, triglycerides 205, HDL 59, LDL 95  Other Studies Reviewed Today:  Echocardiogram 05/07/2018: - Left ventricle: The cavity size was normal. Wall thickness was    normal. Systolic function was normal. The estimated ejection    fraction was in the range of 55% to 60%. Wall motion was normal;    there were no regional wall motion abnormalities. Left    ventricular diastolic function parameters were normal.  - Atrial septum: No defect or patent foramen ovale was identified.   Assessment and Plan:  1.  PSVT, doing well without obvious recurrent symptoms on Toprol-XL 50 mg twice daily.  ECG reviewed today and stable.  Plan to continue observation for now.  2.  Essential hypertension, blood pressure well-controlled today.  She continues to follow with Dr. Nevada Crane.  Medication Adjustments/Labs and Tests Ordered: Current medicines are reviewed at length with the patient today.  Concerns regarding medicines are outlined above.   Tests Ordered: Orders Placed This Encounter  Procedures   EKG 12-Lead    Medication Changes: No orders of the defined types were placed in this encounter.   Disposition:  Follow up  1 year.  Signed, Satira Sark, MD, [facility] 08/02/2022 2:36 PM    [facility] [address] [place], [place] [address] Phone: [phone]; Fax: [phone]

## 2022-08-02 NOTE — Patient Instructions (Signed)
Medication Instructions:  Your physician recommends that you continue on your current medications as directed. Please refer to the Current Medication list given to you today.   Labwork: None  Testing/Procedures: None  Follow-Up: Follow up with Dr. Domenic Polite in 1 year.   Any Other Special Instructions Will Be Listed Below (If Applicable).     If you need a refill on your cardiac medications before your next appointment, please call your pharmacy.

## 2022-08-03 ENCOUNTER — Ambulatory Visit (HOSPITAL_COMMUNITY): Payer: Medicare Other

## 2022-08-14 ENCOUNTER — Ambulatory Visit (HOSPITAL_COMMUNITY)
Admission: RE | Admit: 2022-08-14 | Discharge: 2022-08-14 | Disposition: A | Discharge status: Home / Self Care | Payer: Medicare Other | Source: Ambulatory Visit | Attending: Internal Medicine | Admitting: Internal Medicine

## 2022-08-14 DIAGNOSIS — Z1231 Encounter for screening mammogram for malignant neoplasm of breast: Secondary | ICD-10-CM | POA: Diagnosis not present

## 2022-11-01 DIAGNOSIS — R350 Frequency of micturition: Secondary | ICD-10-CM | POA: Diagnosis not present

## 2022-11-01 DIAGNOSIS — R7303 Prediabetes: Secondary | ICD-10-CM | POA: Diagnosis not present

## 2022-11-01 DIAGNOSIS — E782 Mixed hyperlipidemia: Secondary | ICD-10-CM | POA: Diagnosis not present

## 2022-11-01 DIAGNOSIS — E039 Hypothyroidism, unspecified: Secondary | ICD-10-CM | POA: Diagnosis not present

## 2022-11-01 DIAGNOSIS — N3001 Acute cystitis with hematuria: Secondary | ICD-10-CM | POA: Diagnosis not present

## 2022-11-01 DIAGNOSIS — I1310 Hypertensive heart and chronic kidney disease without heart failure, with stage 1 through stage 4 chronic kidney disease, or unspecified chronic kidney disease: Secondary | ICD-10-CM | POA: Diagnosis not present

## 2022-11-21 ENCOUNTER — Ambulatory Visit (HOSPITAL_COMMUNITY)
Admission: RE | Admit: 2022-11-21 | Discharge: 2022-11-21 | Disposition: A | Discharge status: Home / Self Care | Payer: Medicare Other | Source: Ambulatory Visit | Attending: Family Medicine | Admitting: Family Medicine

## 2022-11-21 ENCOUNTER — Other Ambulatory Visit (HOSPITAL_COMMUNITY): Payer: Self-pay | Admitting: Family Medicine

## 2022-11-21 DIAGNOSIS — M25571 Pain in right ankle and joints of right foot: Secondary | ICD-10-CM | POA: Diagnosis not present

## 2022-11-21 DIAGNOSIS — R600 Localized edema: Secondary | ICD-10-CM

## 2022-11-21 DIAGNOSIS — E782 Mixed hyperlipidemia: Secondary | ICD-10-CM | POA: Diagnosis not present

## 2022-11-21 DIAGNOSIS — M25572 Pain in left ankle and joints of left foot: Secondary | ICD-10-CM | POA: Diagnosis not present

## 2022-11-21 DIAGNOSIS — M79672 Pain in left foot: Secondary | ICD-10-CM | POA: Diagnosis not present

## 2022-11-21 DIAGNOSIS — N181 Chronic kidney disease, stage 1: Secondary | ICD-10-CM | POA: Diagnosis not present

## 2022-11-21 DIAGNOSIS — B372 Candidiasis of skin and nail: Secondary | ICD-10-CM | POA: Diagnosis not present

## 2022-11-21 DIAGNOSIS — M8580 Other specified disorders of bone density and structure, unspecified site: Secondary | ICD-10-CM | POA: Diagnosis not present

## 2022-11-21 DIAGNOSIS — M79671 Pain in right foot: Secondary | ICD-10-CM | POA: Diagnosis not present

## 2022-11-21 DIAGNOSIS — Z0001 Encounter for general adult medical examination with abnormal findings: Secondary | ICD-10-CM | POA: Diagnosis not present

## 2022-11-21 DIAGNOSIS — M7989 Other specified soft tissue disorders: Secondary | ICD-10-CM | POA: Diagnosis not present

## 2022-11-21 DIAGNOSIS — R350 Frequency of micturition: Secondary | ICD-10-CM | POA: Diagnosis not present

## 2022-11-21 DIAGNOSIS — E039 Hypothyroidism, unspecified: Secondary | ICD-10-CM | POA: Diagnosis not present

## 2022-11-21 DIAGNOSIS — Z23 Encounter for immunization: Secondary | ICD-10-CM | POA: Diagnosis not present

## 2022-11-21 DIAGNOSIS — R062 Wheezing: Secondary | ICD-10-CM | POA: Diagnosis not present

## 2022-11-21 DIAGNOSIS — I1310 Hypertensive heart and chronic kidney disease without heart failure, with stage 1 through stage 4 chronic kidney disease, or unspecified chronic kidney disease: Secondary | ICD-10-CM | POA: Diagnosis not present

## 2022-11-21 DIAGNOSIS — R7303 Prediabetes: Secondary | ICD-10-CM | POA: Diagnosis not present

## 2023-02-13 DIAGNOSIS — N390 Urinary tract infection, site not specified: Secondary | ICD-10-CM | POA: Diagnosis not present

## 2023-04-17 DIAGNOSIS — M25562 Pain in left knee: Secondary | ICD-10-CM | POA: Diagnosis not present

## 2023-04-17 DIAGNOSIS — N390 Urinary tract infection, site not specified: Secondary | ICD-10-CM | POA: Diagnosis not present

## 2023-05-21 DIAGNOSIS — E782 Mixed hyperlipidemia: Secondary | ICD-10-CM | POA: Diagnosis not present

## 2023-05-21 DIAGNOSIS — R7303 Prediabetes: Secondary | ICD-10-CM | POA: Diagnosis not present

## 2023-05-21 DIAGNOSIS — E039 Hypothyroidism, unspecified: Secondary | ICD-10-CM | POA: Diagnosis not present

## 2023-06-27 ENCOUNTER — Other Ambulatory Visit (HOSPITAL_COMMUNITY): Payer: Self-pay | Admitting: Internal Medicine

## 2023-06-27 DIAGNOSIS — Z1231 Encounter for screening mammogram for malignant neoplasm of breast: Secondary | ICD-10-CM

## 2023-07-09 DIAGNOSIS — M1712 Unilateral primary osteoarthritis, left knee: Secondary | ICD-10-CM | POA: Diagnosis not present

## 2023-07-10 ENCOUNTER — Other Ambulatory Visit (HOSPITAL_COMMUNITY): Payer: Self-pay | Admitting: Nurse Practitioner

## 2023-07-10 ENCOUNTER — Encounter (HOSPITAL_COMMUNITY): Payer: Self-pay | Admitting: Nurse Practitioner

## 2023-07-10 DIAGNOSIS — Z23 Encounter for immunization: Secondary | ICD-10-CM | POA: Diagnosis not present

## 2023-07-10 DIAGNOSIS — E039 Hypothyroidism, unspecified: Secondary | ICD-10-CM | POA: Diagnosis not present

## 2023-07-10 DIAGNOSIS — G894 Chronic pain syndrome: Secondary | ICD-10-CM | POA: Diagnosis not present

## 2023-07-10 DIAGNOSIS — R32 Unspecified urinary incontinence: Secondary | ICD-10-CM | POA: Diagnosis not present

## 2023-07-10 DIAGNOSIS — M7989 Other specified soft tissue disorders: Secondary | ICD-10-CM | POA: Diagnosis not present

## 2023-07-10 DIAGNOSIS — F329 Major depressive disorder, single episode, unspecified: Secondary | ICD-10-CM | POA: Diagnosis not present

## 2023-07-10 DIAGNOSIS — Z13820 Encounter for screening for osteoporosis: Secondary | ICD-10-CM | POA: Diagnosis not present

## 2023-07-10 DIAGNOSIS — M5442 Lumbago with sciatica, left side: Secondary | ICD-10-CM | POA: Diagnosis not present

## 2023-07-10 DIAGNOSIS — M8580 Other specified disorders of bone density and structure, unspecified site: Secondary | ICD-10-CM | POA: Diagnosis not present

## 2023-07-10 DIAGNOSIS — E782 Mixed hyperlipidemia: Secondary | ICD-10-CM | POA: Diagnosis not present

## 2023-07-10 DIAGNOSIS — M19079 Primary osteoarthritis, unspecified ankle and foot: Secondary | ICD-10-CM | POA: Diagnosis not present

## 2023-07-10 DIAGNOSIS — R7303 Prediabetes: Secondary | ICD-10-CM | POA: Diagnosis not present

## 2023-08-02 ENCOUNTER — Ambulatory Visit: Payer: Medicare HMO | Admitting: Cardiology

## 2023-08-03 DIAGNOSIS — E039 Hypothyroidism, unspecified: Secondary | ICD-10-CM | POA: Diagnosis not present

## 2023-08-06 ENCOUNTER — Ambulatory Visit: Payer: Medicare HMO | Admitting: Nurse Practitioner

## 2023-08-06 NOTE — Progress Notes (Deleted)
  Cardiology Office Note:  .   Date:  08/06/2023  ID:  Devynne Sturdivant, DOB 10/26/1950, MRN [id number] PCP: Benita Stabile, MD  [facility] Providers Cardiologist:  Nona Dell, MD { Click to update primary MD,subspecialty MD or APP then REFRESH:1}   History of Present Illness: .   [name] is a 73 y.o. female with a PMH of PSVT, HTN, GERD, hypothyroidism, and RBBB who presents today for 1 year follow-up.   Last seen by Dr. Diona Browner on August 02, 2022. Was overall doing well at the time.   Today she presents for follow-up. She states...   ROS: ***  Studies Reviewed: .        *** Risk Assessment/Calculations:   {Does this patient have ATRIAL FIBRILLATION?:[phone]} No BP recorded.  {Refresh Note OR Click here to enter BP  :1}***       Physical Exam:   VS:  There were no vitals taken for this visit.   Wt Readings from Last 3 Encounters:  08/02/22 236 lb (107 kg)  08/25/20 244 lb (110.7 kg)  06/26/19 235 lb (106.6 kg)    GEN: Well nourished, well developed in no acute distress NECK: No JVD; No carotid bruits CARDIAC: ***RRR, no murmurs, rubs, gallops RESPIRATORY:  Clear to auscultation without rales, wheezing or rhonchi  ABDOMEN: Soft, non-tender, non-distended EXTREMITIES:  No edema; No deformity   ASSESSMENT AND PLAN: .   ***    {Are you ordering a CV Procedure (e.g. stress test, cath, DCCV, TEE, etc)?   Press F2        :[id number]}  Dispo: ***  Signed, Sharlene Dory, NP

## 2023-08-07 DIAGNOSIS — E039 Hypothyroidism, unspecified: Secondary | ICD-10-CM | POA: Diagnosis not present

## 2023-08-07 DIAGNOSIS — F329 Major depressive disorder, single episode, unspecified: Secondary | ICD-10-CM | POA: Diagnosis not present

## 2023-08-16 ENCOUNTER — Other Ambulatory Visit (HOSPITAL_COMMUNITY): Payer: Medicare HMO

## 2023-08-16 ENCOUNTER — Ambulatory Visit (HOSPITAL_COMMUNITY): Payer: Medicare Other

## 2023-08-30 ENCOUNTER — Ambulatory Visit (HOSPITAL_COMMUNITY): Payer: Self-pay

## 2023-08-30 ENCOUNTER — Other Ambulatory Visit (HOSPITAL_COMMUNITY)

## 2023-09-10 ENCOUNTER — Ambulatory Visit (HOSPITAL_COMMUNITY): Payer: Self-pay

## 2023-09-10 ENCOUNTER — Other Ambulatory Visit (HOSPITAL_COMMUNITY)

## 2023-09-18 DIAGNOSIS — R300 Dysuria: Secondary | ICD-10-CM | POA: Diagnosis not present

## 2023-09-18 DIAGNOSIS — B372 Candidiasis of skin and nail: Secondary | ICD-10-CM | POA: Diagnosis not present

## 2023-09-18 DIAGNOSIS — M25562 Pain in left knee: Secondary | ICD-10-CM | POA: Diagnosis not present

## 2023-09-21 ENCOUNTER — Ambulatory Visit: Admitting: Nurse Practitioner

## 2023-09-26 ENCOUNTER — Ambulatory Visit (HOSPITAL_COMMUNITY): Payer: Self-pay

## 2023-09-26 ENCOUNTER — Other Ambulatory Visit (HOSPITAL_COMMUNITY)

## 2023-09-27 ENCOUNTER — Other Ambulatory Visit: Payer: Self-pay

## 2023-09-27 ENCOUNTER — Emergency Department (HOSPITAL_COMMUNITY)
Admission: EM | Admit: 2023-09-27 | Discharge: 2023-09-27 | Disposition: A | Discharge status: Home / Self Care | Attending: Emergency Medicine | Admitting: Emergency Medicine

## 2023-09-27 DIAGNOSIS — E039 Hypothyroidism, unspecified: Secondary | ICD-10-CM | POA: Diagnosis not present

## 2023-09-27 DIAGNOSIS — M25462 Effusion, left knee: Secondary | ICD-10-CM | POA: Diagnosis not present

## 2023-09-27 DIAGNOSIS — M25562 Pain in left knee: Secondary | ICD-10-CM | POA: Diagnosis not present

## 2023-09-27 DIAGNOSIS — I10 Essential (primary) hypertension: Secondary | ICD-10-CM | POA: Diagnosis not present

## 2023-09-27 DIAGNOSIS — Y92410 Unspecified street and highway as the place of occurrence of the external cause: Secondary | ICD-10-CM | POA: Insufficient documentation

## 2023-09-27 DIAGNOSIS — Z79899 Other long term (current) drug therapy: Secondary | ICD-10-CM | POA: Insufficient documentation

## 2023-09-27 DIAGNOSIS — M545 Low back pain, unspecified: Secondary | ICD-10-CM | POA: Diagnosis not present
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
## 2023-09-28 ENCOUNTER — Ambulatory Visit: Payer: Medicare HMO | Admitting: Nurse Practitioner

## 2023-10-10 ENCOUNTER — Ambulatory Visit (HOSPITAL_COMMUNITY): Payer: Self-pay

## 2023-10-10 ENCOUNTER — Other Ambulatory Visit (HOSPITAL_COMMUNITY)

## 2023-10-17 ENCOUNTER — Ambulatory Visit (HOSPITAL_COMMUNITY): Payer: Self-pay

## 2023-10-17 ENCOUNTER — Emergency Department (HOSPITAL_COMMUNITY)

## 2023-10-17 ENCOUNTER — Other Ambulatory Visit (HOSPITAL_COMMUNITY)

## 2023-10-17 ENCOUNTER — Encounter (HOSPITAL_COMMUNITY): Payer: Self-pay

## 2023-10-17 ENCOUNTER — Other Ambulatory Visit: Payer: Self-pay

## 2023-10-17 ENCOUNTER — Emergency Department (HOSPITAL_COMMUNITY)
Admission: EM | Admit: 2023-10-17 | Discharge: 2023-10-18 | Disposition: A | Discharge status: Home / Self Care | Attending: Emergency Medicine | Admitting: Emergency Medicine

## 2023-10-17 DIAGNOSIS — M25562 Pain in left knee: Secondary | ICD-10-CM | POA: Insufficient documentation

## 2023-10-17 DIAGNOSIS — Y92410 Unspecified street and highway as the place of occurrence of the external cause: Secondary | ICD-10-CM | POA: Insufficient documentation

## 2023-10-17 DIAGNOSIS — S3993XA Unspecified injury of pelvis, initial encounter: Secondary | ICD-10-CM | POA: Diagnosis not present

## 2023-10-17 DIAGNOSIS — I10 Essential (primary) hypertension: Secondary | ICD-10-CM | POA: Diagnosis not present

## 2023-10-17 DIAGNOSIS — M25462 Effusion, left knee: Secondary | ICD-10-CM | POA: Diagnosis not present

## 2023-10-17 DIAGNOSIS — S299XXA Unspecified injury of thorax, initial encounter: Secondary | ICD-10-CM | POA: Diagnosis not present

## 2023-10-17 DIAGNOSIS — S3992XA Unspecified injury of lower back, initial encounter: Secondary | ICD-10-CM | POA: Diagnosis present

## 2023-10-17 DIAGNOSIS — M1712 Unilateral primary osteoarthritis, left knee: Secondary | ICD-10-CM | POA: Diagnosis not present

## 2023-10-17 DIAGNOSIS — S3991XA Unspecified injury of abdomen, initial encounter: Secondary | ICD-10-CM | POA: Diagnosis not present

## 2023-10-17 DIAGNOSIS — S32020A Wedge compression fracture of second lumbar vertebra, initial encounter for closed fracture: Secondary | ICD-10-CM | POA: Diagnosis not present

## 2023-10-17 DIAGNOSIS — R109 Unspecified abdominal pain: Secondary | ICD-10-CM | POA: Diagnosis not present

## 2023-10-17 DIAGNOSIS — R0781 Pleurodynia: Secondary | ICD-10-CM | POA: Diagnosis not present

## 2023-10-17 DIAGNOSIS — S300XXA Contusion of lower back and pelvis, initial encounter: Secondary | ICD-10-CM | POA: Diagnosis not present

## 2023-10-17 DIAGNOSIS — Z743 Need for continuous supervision: Secondary | ICD-10-CM | POA: Diagnosis not present

## 2023-10-17 DIAGNOSIS — I700 Atherosclerosis of aorta: Secondary | ICD-10-CM | POA: Diagnosis not present

## 2023-10-17 LAB — TROPONIN I (HIGH SENSITIVITY): Troponin I (High Sensitivity): 3 ng/L (ref <18)

## 2023-10-17 LAB — COMPREHENSIVE METABOLIC PANEL WITH GFR
ALT: 20 U/L (ref 0–44)
AST: 22 U/L (ref 15–41)
Albumin: 3.8 g/dL (ref 3.5–5.0)
Alkaline Phosphatase: 92 U/L (ref 38–126)
Anion gap: 8 (ref 5–15)
BUN: 22 mg/dL (ref 8–23)
CO2: 25 mmol/L (ref 22–32)
Calcium: 9.8 mg/dL (ref 8.9–10.3)
Chloride: 103 mmol/L (ref 98–111)
Creatinine, Ser: 0.99 mg/dL (ref 0.44–1.00)
GFR, Estimated: >60 mL/min (ref >60)
Glucose, Bld: 93 mg/dL (ref 70–99)
Potassium: 4.5 mmol/L (ref 3.5–5.1)
Sodium: 136 mmol/L (ref 135–145)
Total Bilirubin: 0.5 mg/dL (ref 0.0–1.2)
Total Protein: 7.2 g/dL (ref 6.5–8.1)

## 2023-10-17 LAB — CBC WITH DIFFERENTIAL/PLATELET
Abs Immature Granulocytes: 0.01 10*3/uL (ref 0.00–0.07)
Basophils Absolute: 0.1 10*3/uL (ref 0.0–0.1)
Basophils Relative: 1 %
Eosinophils Absolute: 0.2 10*3/uL (ref 0.0–0.5)
Eosinophils Relative: 2 %
HCT: 42.7 % (ref 36.0–46.0)
Hemoglobin: 13.7 g/dL (ref 12.0–15.0)
Immature Granulocytes: 0 %
Lymphocytes Relative: 36 %
Lymphs Abs: 2.8 10*3/uL (ref 0.7–4.0)
MCH: 29.1 pg (ref 26.0–34.0)
MCHC: 32.1 g/dL (ref 30.0–36.0)
MCV: 90.9 fL (ref 80.0–100.0)
Monocytes Absolute: 0.8 10*3/uL (ref 0.1–1.0)
Monocytes Relative: 10 %
Neutro Abs: 3.8 10*3/uL (ref 1.7–7.7)
Neutrophils Relative %: 51 %
Platelets: 261 10*3/uL (ref 150–400)
RBC: 4.7 MIL/uL (ref 3.87–5.11)
RDW: 12.1 % (ref 11.5–15.5)
WBC: 7.6 10*3/uL (ref 4.0–10.5)
nRBC: 0 % (ref 0.0–0.2)

## 2023-10-17 MED ORDER — HYDROCODONE-ACETAMINOPHEN 5-325 MG PO TABS: 1.0000 | ORAL_TABLET | Freq: Four times a day (QID) | ORAL | 0 refills | Status: AC | PRN

## 2023-10-17 MED ORDER — HYDROCODONE-ACETAMINOPHEN 5-325 MG PO TABS
1.0000 | ORAL_TABLET | Freq: Once | ORAL | Status: AC
  Administered 2023-10-17: 1 via ORAL
  Filled 2023-10-17: qty 1

## 2023-10-17 MED ORDER — IOHEXOL 300 MG/ML  SOLN
100.0000 mL | Freq: Once | INTRAMUSCULAR | Status: AC | PRN
  Administered 2023-10-17: 100 mL via INTRAVENOUS
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
## 2023-10-18 DIAGNOSIS — S32020A Wedge compression fracture of second lumbar vertebra, initial encounter for closed fracture: Secondary | ICD-10-CM | POA: Diagnosis not present

## 2023-10-18 MED ORDER — HYDROCODONE-ACETAMINOPHEN 5-325 MG PO TABS
1.0000 | ORAL_TABLET | Freq: Once | ORAL | Status: AC
  Administered 2023-10-18: 1 via ORAL
  Filled 2023-10-18: qty 1

## 2023-10-18 NOTE — ED Notes (Signed)
 This RN informed by previous RN that [facility] has been paged and notified of TLSO Brace order and we are awaiting call back for eta.

## 2023-10-18 NOTE — ED Notes (Signed)
 Ortho at bedside to apply TLSO Brace.

## 2023-10-18 NOTE — ED Notes (Signed)
 Awaiting update and ETA from [facility] for TLSO Brace. Secretary to call again. Awaiting brace prior to DC. Pt c/o ongoing back pain, MD notified; see new orders. Pt assisted with repositioning and provided with warm blanket.

## 2023-10-22 ENCOUNTER — Ambulatory Visit: Admitting: Cardiology

## 2023-10-26 ENCOUNTER — Ambulatory Visit (HOSPITAL_COMMUNITY)
Admit: 2023-10-26 | Discharge: 2023-10-26 | Disposition: A | Discharge status: Home / Self Care | Attending: Nurse Practitioner | Admitting: Nurse Practitioner

## 2023-10-26 ENCOUNTER — Ambulatory Visit (HOSPITAL_COMMUNITY)
Admission: RE | Admit: 2023-10-26 | Discharge: 2023-10-26 | Disposition: A | Discharge status: Home / Self Care | Source: Ambulatory Visit | Attending: Internal Medicine | Admitting: Internal Medicine

## 2023-10-26 DIAGNOSIS — Z13820 Encounter for screening for osteoporosis: Secondary | ICD-10-CM

## 2023-10-26 DIAGNOSIS — M8589 Other specified disorders of bone density and structure, multiple sites: Secondary | ICD-10-CM | POA: Diagnosis not present

## 2023-10-26 DIAGNOSIS — Z780 Asymptomatic menopausal state: Secondary | ICD-10-CM | POA: Diagnosis not present

## 2023-10-26 DIAGNOSIS — Z1231 Encounter for screening mammogram for malignant neoplasm of breast: Secondary | ICD-10-CM | POA: Diagnosis not present

## 2023-11-02 DIAGNOSIS — I1310 Hypertensive heart and chronic kidney disease without heart failure, with stage 1 through stage 4 chronic kidney disease, or unspecified chronic kidney disease: Secondary | ICD-10-CM | POA: Diagnosis not present

## 2023-11-02 DIAGNOSIS — R7303 Prediabetes: Secondary | ICD-10-CM | POA: Diagnosis not present

## 2023-11-06 ENCOUNTER — Ambulatory Visit: Admitting: Orthopedic Surgery

## 2023-11-07 DIAGNOSIS — F329 Major depressive disorder, single episode, unspecified: Secondary | ICD-10-CM | POA: Diagnosis not present

## 2023-11-07 DIAGNOSIS — R32 Unspecified urinary incontinence: Secondary | ICD-10-CM | POA: Diagnosis not present

## 2023-11-07 DIAGNOSIS — M19071 Primary osteoarthritis, right ankle and foot: Secondary | ICD-10-CM | POA: Diagnosis not present

## 2023-11-07 DIAGNOSIS — I1310 Hypertensive heart and chronic kidney disease without heart failure, with stage 1 through stage 4 chronic kidney disease, or unspecified chronic kidney disease: Secondary | ICD-10-CM | POA: Diagnosis not present

## 2023-11-07 DIAGNOSIS — E782 Mixed hyperlipidemia: Secondary | ICD-10-CM | POA: Diagnosis not present

## 2023-11-07 DIAGNOSIS — I471 Supraventricular tachycardia, unspecified: Secondary | ICD-10-CM | POA: Diagnosis not present

## 2023-11-07 DIAGNOSIS — M8580 Other specified disorders of bone density and structure, unspecified site: Secondary | ICD-10-CM | POA: Diagnosis not present

## 2023-11-07 DIAGNOSIS — R7303 Prediabetes: Secondary | ICD-10-CM | POA: Diagnosis not present

## 2023-11-07 DIAGNOSIS — G894 Chronic pain syndrome: Secondary | ICD-10-CM | POA: Diagnosis not present

## 2023-11-07 DIAGNOSIS — M7989 Other specified soft tissue disorders: Secondary | ICD-10-CM | POA: Diagnosis not present

## 2023-11-07 DIAGNOSIS — E039 Hypothyroidism, unspecified: Secondary | ICD-10-CM | POA: Diagnosis not present

## 2023-11-07 DIAGNOSIS — M5442 Lumbago with sciatica, left side: Secondary | ICD-10-CM | POA: Diagnosis not present

## 2023-11-07 DIAGNOSIS — M545 Low back pain, unspecified: Secondary | ICD-10-CM | POA: Insufficient documentation

## 2023-11-13 ENCOUNTER — Ambulatory Visit: Admitting: Orthopedic Surgery

## 2024-01-01 ENCOUNTER — Ambulatory Visit: Admitting: Cardiology

## 2024-01-01 ENCOUNTER — Ambulatory Visit: Admitting: Orthopedic Surgery

## 2024-01-15 ENCOUNTER — Ambulatory Visit: Admitting: Orthopedic Surgery

## 2024-01-22 ENCOUNTER — Ambulatory Visit: Admitting: Orthopedic Surgery

## 2024-01-31 ENCOUNTER — Encounter: Payer: Self-pay | Admitting: Orthopaedic Surgery

## 2024-01-31 ENCOUNTER — Ambulatory Visit (INDEPENDENT_AMBULATORY_CARE_PROVIDER_SITE_OTHER): Admitting: Orthopaedic Surgery

## 2024-01-31 VITALS — BP 126/80 | HR 84 | Ht 66.0 in | Wt 213.0 lb

## 2024-01-31 DIAGNOSIS — M25562 Pain in left knee: Secondary | ICD-10-CM | POA: Diagnosis not present

## 2024-01-31 DIAGNOSIS — G8929 Other chronic pain: Secondary | ICD-10-CM

## 2024-01-31 MED ORDER — METHYLPREDNISOLONE ACETATE 40 MG/ML IJ SUSP
40.0000 mg | Freq: Once | INTRAMUSCULAR | Status: AC
  Administered 2024-01-31: 40 mg via INTRA_ARTICULAR
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
## 2024-02-14 DIAGNOSIS — I1310 Hypertensive heart and chronic kidney disease without heart failure, with stage 1 through stage 4 chronic kidney disease, or unspecified chronic kidney disease: Secondary | ICD-10-CM | POA: Diagnosis not present

## 2024-02-14 DIAGNOSIS — M19079 Primary osteoarthritis, unspecified ankle and foot: Secondary | ICD-10-CM | POA: Diagnosis not present

## 2024-02-14 DIAGNOSIS — M8580 Other specified disorders of bone density and structure, unspecified site: Secondary | ICD-10-CM | POA: Diagnosis not present

## 2024-02-14 DIAGNOSIS — E039 Hypothyroidism, unspecified: Secondary | ICD-10-CM | POA: Diagnosis not present

## 2024-02-14 DIAGNOSIS — Z8679 Personal history of other diseases of the circulatory system: Secondary | ICD-10-CM | POA: Diagnosis not present

## 2024-02-14 DIAGNOSIS — R7303 Prediabetes: Secondary | ICD-10-CM | POA: Diagnosis not present

## 2024-02-14 DIAGNOSIS — E782 Mixed hyperlipidemia: Secondary | ICD-10-CM | POA: Diagnosis not present

## 2024-02-14 DIAGNOSIS — R32 Unspecified urinary incontinence: Secondary | ICD-10-CM | POA: Diagnosis not present

## 2024-02-14 DIAGNOSIS — M7989 Other specified soft tissue disorders: Secondary | ICD-10-CM | POA: Diagnosis not present

## 2024-02-14 DIAGNOSIS — M5442 Lumbago with sciatica, left side: Secondary | ICD-10-CM | POA: Diagnosis not present

## 2024-02-14 DIAGNOSIS — Z23 Encounter for immunization: Secondary | ICD-10-CM | POA: Diagnosis not present

## 2024-02-14 DIAGNOSIS — F329 Major depressive disorder, single episode, unspecified: Secondary | ICD-10-CM | POA: Diagnosis not present

## 2024-03-17 ENCOUNTER — Ambulatory Visit: Attending: Cardiology | Admitting: Cardiology

## 2024-03-17 ENCOUNTER — Encounter: Payer: Self-pay | Admitting: Cardiology

## 2024-03-17 VITALS — BP 102/64 | HR 80 | Ht 66.0 in | Wt 216.0 lb

## 2024-03-17 DIAGNOSIS — I10 Essential (primary) hypertension: Secondary | ICD-10-CM

## 2024-03-17 DIAGNOSIS — I471 Supraventricular tachycardia, unspecified: Secondary | ICD-10-CM

## 2024-03-17 NOTE — Patient Instructions (Addendum)

## 2024-03-17 NOTE — Progress Notes (Signed)
    Cardiology Office Note  Date: 03/17/2024   ID: [name], DOB 06/21/1950, MRN [id number]  History of Present Illness: [name] is a 73 y.o. female last seen in February 2024.  She is here for a routine visit.  Reports no interval palpitations or chest pain, no syncope.  We went over her medications, she remains on Toprol -XL 50 mg twice daily.  I reviewed her ECG from May.  She continues to follow regularly with Dr. Shona for primary care.  Blood pressure is low normal today and she is asymptomatic.  Physical Exam: VS:  BP 102/64 (BP Location: Left Arm)   Pulse 80   Ht 5' 6 (1.676 m)   Wt 216 lb (98 kg)   SpO2 100%   BMI 34.86 kg/m , BMI Body mass index is 34.86 kg/m.  Wt Readings from Last 3 Encounters:  03/17/24 216 lb (98 kg)  01/31/24 213 lb (96.6 kg)  10/17/23 222 lb 0.1 oz (100.7 kg)    General: Patient appears comfortable at rest. HEENT: Conjunctiva and lids normal. Neck: Supple, no elevated JVP or carotid bruits. Lungs: Clear to auscultation, nonlabored breathing at rest. Cardiac: Regular rate and rhythm, no S3 or significant systolic murmur.  ECG:  An ECG dated 10/17/2023 was personally reviewed today and demonstrated:  Sinus rhythm with right bundle branch block.  Labwork: 10/17/2023: ALT 20; AST 22; BUN 22; Creatinine, Ser 0.99; Hemoglobin 13.7; Platelets 261; Potassium 4.5; Sodium 136  June 2025: TSH 2.07, hemoglobin 14.9, platelets 283, BUN 20, creatinine 1.18, GFR 49, potassium 4.1, AST 28, ALT 29, cholesterol 204, triglycerides 167, HDL 74, LDL 102, hemoglobin A1c 5.6%  Other Studies Reviewed Today:  No interval cardiac testing for review today.  Assessment and Plan:  1.  PSVT.  Quiescent, no interval palpitations on Toprol -XL 50 mg twice daily.  I reviewed her ECG from May.  Continue with observation.  2.  Primary hypertension.  Blood pressure low normal range today and asymptomatic.  Keep follow-up with Dr. Shona.  Disposition:  Follow up 1  year.  Signed, Jayson JUDITHANN Sierras, [name], F.A.C.C. [facility]

## 2024-03-27 ENCOUNTER — Encounter: Payer: Self-pay | Admitting: Internal Medicine

## 2024-04-15 ENCOUNTER — Encounter: Payer: Self-pay | Admitting: Internal Medicine

## 2024-05-15 ENCOUNTER — Ambulatory Visit: Admitting: Internal Medicine
